# Patient Record
Sex: Female | Born: 1985 | Race: Black or African American | Hispanic: No | Marital: Married | Smoking: Former smoker
Health system: Southern US, Community
[De-identification: ages and names within clinical notes are randomized; demographics above are authoritative.]

## PROBLEM LIST (undated history)

## (undated) DIAGNOSIS — J45909 Unspecified asthma, uncomplicated: Secondary | ICD-10-CM

## (undated) HISTORY — PX: TONSILLECTOMY: SUR1361

---

## 2006-02-01 HISTORY — PX: CERVICAL BIOPSY  W/ LOOP ELECTRODE EXCISION: SUR135

## 2008-04-13 ENCOUNTER — Observation Stay: Payer: Self-pay

## 2008-07-23 ENCOUNTER — Inpatient Hospital Stay: Payer: Self-pay | Admitting: Unknown Physician Specialty

## 2009-05-23 ENCOUNTER — Ambulatory Visit: Payer: Self-pay | Admitting: Family Medicine

## 2013-09-28 DIAGNOSIS — Z9889 Other specified postprocedural states: Secondary | ICD-10-CM | POA: Insufficient documentation

## 2015-03-07 ENCOUNTER — Ambulatory Visit: Payer: Self-pay | Admitting: Internal Medicine

## 2015-03-17 ENCOUNTER — Ambulatory Visit (INDEPENDENT_AMBULATORY_CARE_PROVIDER_SITE_OTHER): Payer: Managed Care, Other (non HMO) | Admitting: Internal Medicine

## 2015-03-17 ENCOUNTER — Encounter: Payer: Self-pay | Admitting: Internal Medicine

## 2015-03-17 VITALS — BP 122/76 | HR 80 | Ht 65.0 in | Wt 230.0 lb

## 2015-03-17 DIAGNOSIS — D509 Iron deficiency anemia, unspecified: Secondary | ICD-10-CM | POA: Insufficient documentation

## 2015-03-17 DIAGNOSIS — R635 Abnormal weight gain: Secondary | ICD-10-CM

## 2015-03-17 DIAGNOSIS — R3915 Urgency of urination: Secondary | ICD-10-CM

## 2015-03-17 DIAGNOSIS — E785 Hyperlipidemia, unspecified: Secondary | ICD-10-CM

## 2015-03-17 DIAGNOSIS — M5431 Sciatica, right side: Secondary | ICD-10-CM | POA: Insufficient documentation

## 2015-03-17 LAB — POCT URINALYSIS DIPSTICK
BILIRUBIN UA: NEGATIVE
Glucose, UA: NEGATIVE
KETONES UA: NEGATIVE
LEUKOCYTES UA: NEGATIVE
Nitrite, UA: NEGATIVE
PH UA: 6
Protein, UA: NEGATIVE
RBC UA: NEGATIVE
Spec Grav, UA: >=1.03
Urobilinogen, UA: 0.2

## 2015-03-17 MED ORDER — IBUPROFEN 800 MG PO TABS: 800.0000 mg | ORAL_TABLET | Freq: Three times a day (TID) | ORAL | Status: DC | PRN

## 2015-03-17 NOTE — Progress Notes (Signed)
Date:  03/17/2015   Name:  Carly Martin   DOB:  Jun 23, 1985   MRN:  [id number]  Patient is re-establishing care.  She left due to [employer] issues.  She also had a baby last year and was under the care of OB-GYN.  Now she has another plan and wants to re-establish.  Chief Complaint: New Evaluation and Low Back Pressure Back Pain This is a new problem. The current episode started more than 1 year ago. The problem occurs every several days. The pain is present in the gluteal. The quality of the pain is described as shooting. The pain radiates to the right knee. The pain is mild. Associated symptoms include dysuria. Pertinent negatives include no abdominal pain, chest pain, fever or headaches. She has tried heat for the symptoms. The treatment provided mild relief.  She also wonders if she might not have a urinary tract infection. Yesterday she had a little bit of discomfort at the end of urination but no persistent discomfort or hematuria.  Weight gain - she has gained 30 lbs in the past year.  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  She has recently started trying to take the stairs and walk during lunch.  Anemia - moderate anemia after her delivery.  She has not had any follow up or taking iron supplement.  Review of Systems  Constitutional: Positive for unexpected weight change (30 lbs in the past year). Negative for fever, chills and fatigue.  Respiratory: Negative for chest tightness and shortness of breath.   Cardiovascular: Negative for chest pain, palpitations and leg swelling.  Gastrointestinal: Negative for abdominal pain, constipation and blood in stool.  Genitourinary: Positive for dysuria. Negative for hematuria and menstrual problem.  Musculoskeletal: Positive for back pain. Negative for myalgias and joint swelling.  Skin: Negative for rash.  Neurological: Negative for dizziness and headaches.    Patient Active Problem List   Diagnosis Date  Noted  . History of surgical procedure 09/28/2013    Prior to Admission medications   Medication Sig Start Date End Date Taking? Authorizing Provider  albuterol (VENTOLIN HFA) 108 (90 Base) MCG/ACT inhaler Inhale 2 puffs into the lungs every 6 (six) hours as needed. 03/06/13  Yes Historical Provider, MD    Allergies  Allergen Reactions  . Codeine Shortness Of Breath    Past Surgical History  Procedure Laterality Date  . No past surgeries      Social History  Substance Use Topics  . Smoking status: Never Smoker   . Smokeless tobacco: None  . Alcohol Use: No    Medication list has been reviewed and updated.   Physical Exam  Constitutional: She is oriented to person, place, and time. She appears well-developed and well-nourished. No distress.  HENT:  Head: Normocephalic and atraumatic.  Neck: Normal range of motion. Neck supple. No thyromegaly present.  Cardiovascular: Normal rate, regular rhythm and normal heart sounds.   Pulmonary/Chest: Effort normal and breath sounds normal. No respiratory distress.  Musculoskeletal: She exhibits no edema.       Lumbar back: She exhibits tenderness. She exhibits no pain and no spasm.  Positive SLR sitting on right Negative SLR sitting on left  Neurological: She is alert and oriented to person, place, and time. She has normal reflexes.  Skin: Skin is warm and dry. No rash noted.  Psychiatric: She has a normal mood and affect. Her behavior is normal. Thought content normal.    BP 122/76  mmHg  Pulse 80  Ht  (1.651 m)  Wt 230 lb (104.327 kg)  BMI 38.27 kg/m2  LMP 03/02/2015  Assessment and Plan: 1. Sciatica of right side Adjust work station - lumbar support, foot rest, frequent breaks Heat or ice as needed - ibuprofen (ADVIL,MOTRIN) 800 MG tablet; Take 1 tablet (800 mg total) by mouth every 8 (eight) hours as needed.  Dispense: 90 tablet; Refill: 3  2. Urinary urgency UA negative - POCT urinalysis dipstick  3.  Hyperlipidemia Advise low fat diet - Lipid panel  4. Iron deficiency anemia May need to begin iron supplement - CBC with Differential/Platelet  5. Weight gain, abnormal - Comprehensive metabolic panel - Hemoglobin A1c - TSH   Bari Edward, MD [facility]  03/17/2015

## 2015-03-19 ENCOUNTER — Telehealth: Payer: Self-pay

## 2015-03-19 LAB — COMPREHENSIVE METABOLIC PANEL
ALBUMIN: 4.2 g/dL (ref 3.5–5.5)
ALK PHOS: 60 IU/L (ref 39–117)
ALT: 14 IU/L (ref 0–32)
AST: 14 IU/L (ref 0–40)
Albumin/Globulin Ratio: 1.4 (ref 1.1–2.5)
BUN/Creatinine Ratio: 17 (ref 8–20)
BUN: 11 mg/dL (ref 6–20)
Bilirubin Total: 0.4 mg/dL (ref 0.0–1.2)
CHLORIDE: 98 mmol/L (ref 96–106)
CO2: 23 mmol/L (ref 18–29)
Calcium: 9.3 mg/dL (ref 8.7–10.2)
Creatinine, Ser: 0.64 mg/dL (ref 0.57–1.00)
GFR calc Af Amer: 138 mL/min/{1.73_m2} (ref >59)
GFR, EST NON AFRICAN AMERICAN: 120 mL/min/{1.73_m2} (ref >59)
GLOBULIN, TOTAL: 3.1 g/dL (ref 1.5–4.5)
Glucose: 88 mg/dL (ref 65–99)
POTASSIUM: 4.6 mmol/L (ref 3.5–5.2)
Sodium: 136 mmol/L (ref 134–144)
TOTAL PROTEIN: 7.3 g/dL (ref 6.0–8.5)

## 2015-03-19 LAB — CBC WITH DIFFERENTIAL/PLATELET
Basophils Absolute: 0.1 10*3/uL (ref 0.0–0.2)
Basos: 1 %
EOS (ABSOLUTE): 0.2 10*3/uL (ref 0.0–0.4)
EOS: 2 %
HEMATOCRIT: 36.9 % (ref 34.0–46.6)
HEMOGLOBIN: 11.3 g/dL (ref 11.1–15.9)
Immature Grans (Abs): 0 10*3/uL (ref 0.0–0.1)
Immature Granulocytes: 0 %
LYMPHS ABS: 2.5 10*3/uL (ref 0.7–3.1)
Lymphs: 32 %
MCH: 26.7 pg (ref 26.6–33.0)
MCHC: 30.6 g/dL — AB (ref 31.5–35.7)
MCV: 87 fL (ref 79–97)
MONOCYTES: 6 %
Monocytes Absolute: 0.5 10*3/uL (ref 0.1–0.9)
NEUTROS ABS: 4.7 10*3/uL (ref 1.4–7.0)
Neutrophils: 59 %
Platelets: 308 10*3/uL (ref 150–379)
RBC: 4.24 x10E6/uL (ref 3.77–5.28)
RDW: 15.3 % (ref 12.3–15.4)
WBC: 7.9 10*3/uL (ref 3.4–10.8)

## 2015-03-19 LAB — LIPID PANEL
Chol/HDL Ratio: 4.6 ratio units — ABNORMAL HIGH (ref 0.0–4.4)
Cholesterol, Total: 165 mg/dL (ref 100–199)
HDL: 36 mg/dL — AB (ref >39)
LDL Calculated: 120 mg/dL — ABNORMAL HIGH (ref 0–99)
TRIGLYCERIDES: 46 mg/dL (ref 0–149)
VLDL CHOLESTEROL CAL: 9 mg/dL (ref 5–40)

## 2015-03-19 LAB — HEMOGLOBIN A1C
Est. average glucose Bld gHb Est-mCnc: 114 mg/dL
Hgb A1c MFr Bld: 5.6 % (ref 4.8–5.6)

## 2015-03-19 LAB — TSH: TSH: 2 u[IU]/mL (ref 0.450–4.500)

## 2015-03-19 NOTE — Telephone Encounter (Signed)
Spoke with patient. Patient advised of all results and verbalized understanding. Will call back with any future questions or concerns. MAH  

## 2015-03-19 NOTE — Telephone Encounter (Signed)
-----   Message from Reubin Milan, MD sent at 03/19/2015  1:57 PM EST ----- Cholesterol i(LDL) is slightly high - would benefit from regular exercise and a low fat diet.  All other labs are normal.

## 2015-04-14 ENCOUNTER — Encounter: Payer: Self-pay | Admitting: Internal Medicine

## 2015-04-14 ENCOUNTER — Other Ambulatory Visit: Payer: Self-pay | Admitting: Internal Medicine

## 2015-04-14 MED ORDER — CIPROFLOXACIN HCL 250 MG PO TABS: 250.0000 mg | ORAL_TABLET | Freq: Two times a day (BID) | ORAL | Status: DC

## 2015-09-03 DIAGNOSIS — G44229 Chronic tension-type headache, not intractable: Secondary | ICD-10-CM | POA: Insufficient documentation

## 2015-09-03 DIAGNOSIS — R202 Paresthesia of skin: Principal | ICD-10-CM

## 2015-09-03 DIAGNOSIS — R200 Anesthesia of skin: Secondary | ICD-10-CM | POA: Insufficient documentation

## 2015-12-12 ENCOUNTER — Ambulatory Visit (INDEPENDENT_AMBULATORY_CARE_PROVIDER_SITE_OTHER): Payer: Managed Care, Other (non HMO) | Admitting: Internal Medicine

## 2015-12-12 ENCOUNTER — Other Ambulatory Visit: Payer: Self-pay | Admitting: Internal Medicine

## 2015-12-12 ENCOUNTER — Encounter: Payer: Self-pay | Admitting: Internal Medicine

## 2015-12-12 VITALS — BP 112/64 | HR 106 | Ht 65.0 in | Wt 228.4 lb

## 2015-12-12 DIAGNOSIS — R8290 Unspecified abnormal findings in urine: Secondary | ICD-10-CM | POA: Diagnosis not present

## 2015-12-12 DIAGNOSIS — N3000 Acute cystitis without hematuria: Secondary | ICD-10-CM

## 2015-12-12 DIAGNOSIS — R202 Paresthesia of skin: Principal | ICD-10-CM

## 2015-12-12 DIAGNOSIS — R200 Anesthesia of skin: Secondary | ICD-10-CM

## 2015-12-12 LAB — POCT URINALYSIS DIPSTICK
BILIRUBIN UA: NEGATIVE
Blood, UA: NEGATIVE
GLUCOSE UA: NEGATIVE
KETONES UA: NEGATIVE
LEUKOCYTES UA: NEGATIVE
Nitrite, UA: NEGATIVE
PROTEIN UA: NEGATIVE
SPEC GRAV UA: 1.025
Urobilinogen, UA: 0.2
pH, UA: 6.5

## 2015-12-12 MED ORDER — CIPROFLOXACIN HCL 250 MG PO TABS: 250.0000 mg | ORAL_TABLET | Freq: Two times a day (BID) | ORAL | 0 refills | Status: AC

## 2015-12-12 NOTE — Progress Notes (Signed)
Date:  12/12/2015   Name:  Carly Martin   DOB:  08-Jan-1986   MRN:  [id number]   Chief Complaint: Urinary Tract Infection (x2 weeks, Azo helped a little, urine has ordor and cloudy, feels like bladder is really full but only a little comes out) Urinary Tract Infection   This is a new problem. The current episode started in the past 7 days. The problem occurs every urination. The problem has been waxing and waning. The quality of the pain is described as aching. There has been no fever. Associated symptoms include frequency and urgency. Pertinent negatives include no chills. The treatment provided mild relief.     Review of Systems  Constitutional: Negative for chills, fatigue and fever.  Respiratory: Negative for chest tightness, shortness of breath and stridor.   Cardiovascular: Negative for chest pain.  Endocrine: Positive for polyuria. Negative for polydipsia.  Genitourinary: Positive for frequency and urgency. Negative for difficulty urinating, dyspareunia and pelvic pain.    Patient Active Problem List   Diagnosis Date Noted  . Numbness and tingling 09/03/2015  . Chronic tension-type headache, not intractable 09/03/2015  . Sciatica of right side 03/17/2015  . Iron deficiency anemia 03/17/2015  . History of surgical procedure 09/28/2013    Prior to Admission medications   Medication Sig Start Date End Date Taking? Authorizing Provider  albuterol (VENTOLIN HFA) 108 (90 Base) MCG/ACT inhaler Inhale 2 puffs into the lungs every 6 (six) hours as needed. 03/06/13  Yes Historical Provider, MD  etonogestrel (IMPLANON) 68 MG IMPL implant 1 each by Subdermal route once.   Yes Historical Provider, MD  gabapentin (NEURONTIN) 100 MG capsule Take 2 capsules by mouth 2 (two) times daily. 10/15/15  Yes Historical Provider, MD  ibuprofen (ADVIL,MOTRIN) 800 MG tablet Take 1 tablet (800 mg total) by mouth every 8 (eight) hours as needed. 03/17/15  Yes Reubin Milan, MD    Allergies    Allergen Reactions  . Codeine Shortness Of Breath    Past Surgical History:  Procedure Laterality Date  . CERVICAL BIOPSY  W/ LOOP ELECTRODE EXCISION  2008    Social History  Substance Use Topics  . Smoking status: Former Games developer  . Smokeless tobacco: Not on file  . Alcohol use 0.0 oz/week     Medication list has been reviewed and updated.   Physical Exam  Constitutional: She is oriented to person, place, and time. She appears well-developed. No distress.  HENT:  Head: Normocephalic and atraumatic.  Neck: Normal range of motion. Neck supple.  Cardiovascular: Normal rate, regular rhythm and normal heart sounds.   Pulmonary/Chest: Effort normal and breath sounds normal. No respiratory distress.  Abdominal: Soft. Bowel sounds are normal. There is tenderness in the suprapubic area. There is no CVA tenderness.  Musculoskeletal: Normal range of motion.  Neurological: She is alert and oriented to person, place, and time.  Skin: Skin is warm and dry. No rash noted.  Psychiatric: She has a normal mood and affect. Her behavior is normal. Thought content normal.  Nursing note and vitals reviewed.   BP 112/64 (BP Location: Left Arm, Patient Position: Sitting, Cuff Size: Large)   Pulse (!) 106   Ht 5\' 5"  (1.651 m)   Wt 228 lb 6.4 oz (103.6 kg)   SpO2 98%   BMI 38.01 kg/m   Assessment and Plan: 1. Abnormal urine odor - POCT urinalysis dipstick  2. Acute cystitis without hematuria Increase fluids - ciprofloxacin (CIPRO) 250 MG tablet; Take 1  tablet (250 mg total) by mouth 2 (two) times daily.  Dispense: 14 tablet; Refill: 0   Bari EdwardLaura Lucie Friedlander, MD [facility]  12/12/2015

## 2015-12-29 ENCOUNTER — Ambulatory Visit (INDEPENDENT_AMBULATORY_CARE_PROVIDER_SITE_OTHER): Payer: Managed Care, Other (non HMO)

## 2015-12-29 ENCOUNTER — Ambulatory Visit
Admission: EM | Admit: 2015-12-29 | Discharge: 2015-12-29 | Disposition: A | Discharge status: Home / Self Care | Payer: Managed Care, Other (non HMO) | Attending: Family Medicine | Admitting: Family Medicine

## 2015-12-29 DIAGNOSIS — W19XXXA Unspecified fall, initial encounter: Secondary | ICD-10-CM

## 2015-12-29 DIAGNOSIS — Y92099 Unspecified place in other non-institutional residence as the place of occurrence of the external cause: Secondary | ICD-10-CM

## 2015-12-29 DIAGNOSIS — S161XXA Strain of muscle, fascia and tendon at neck level, initial encounter: Secondary | ICD-10-CM | POA: Diagnosis not present

## 2015-12-29 DIAGNOSIS — Y92009 Unspecified place in unspecified non-institutional (private) residence as the place of occurrence of the external cause: Principal | ICD-10-CM

## 2015-12-29 DIAGNOSIS — S99912A Unspecified injury of left ankle, initial encounter: Secondary | ICD-10-CM

## 2015-12-29 MED ORDER — MELOXICAM 15 MG PO TABS: 15.0000 mg | ORAL_TABLET | Freq: Every day | ORAL | 1 refills | Status: DC

## 2015-12-29 MED ORDER — ORPHENADRINE CITRATE ER 100 MG PO TB12: 100.0000 mg | ORAL_TABLET | Freq: Two times a day (BID) | ORAL | 0 refills | Status: DC

## 2015-12-29 NOTE — ED Provider Notes (Signed)
[facility]    CSN: [id number] Arrival date & time: 12/29/15  1845     History   Chief Complaint Chief Complaint  Patient presents with  . Fall    HPI Perrin SmackSharika L Williams is a 30 y.o. female.   Patient is a 30 year old black female who fell this evening. She states that she was at home cooking fried pork chops when apparently she forgot that the wash shingle was open and she tripped over the wash machine door landing with her head hitting the refrigerator which broke her fall. She states that side with 3 children laughing at her she was stunned and had difficulty getting up initially. She never lost consciousness. She did express some nausea but no vomiting afterwards saw stars. She started having neck pain headache and left ankle pain as well. And came to the urgent care to be seen and evaluated. No history of concussions before the history is significant ankle or neck injury. Systemic migraines she is a former smoker and she is allergic to codeine. She's had a history of chronic tension type headaches psych on the right side. She has cervical biopsies before with loop cauterization. She is allergic to codeine   The history is provided by the patient and the spouse. No language interpreter was used.  Fall  This is a new problem. The current episode started 3 to 5 hours ago. The problem occurs constantly. The problem has been gradually improving. Associated symptoms include headaches. Pertinent negatives include no chest pain and no abdominal pain. Nothing aggravates the symptoms. She has tried nothing for the symptoms. The treatment provided no relief.    History reviewed. No pertinent past medical history.  Patient Active Problem List   Diagnosis Date Noted  . Numbness and tingling 09/03/2015  . Chronic tension-type headache, not intractable 09/03/2015  . Sciatica of right side 03/17/2015  . Iron deficiency anemia 03/17/2015  . History of surgical procedure 09/28/2013     Past Surgical History:  Procedure Laterality Date  . CERVICAL BIOPSY  W/ LOOP ELECTRODE EXCISION  2008    OB History    No data available       Home Medications    Prior to Admission medications   Medication Sig Start Date End Date Taking? Authorizing Provider  albuterol (VENTOLIN HFA) 108 (90 Base) MCG/ACT inhaler Inhale 2 puffs into the lungs every 6 (six) hours as needed. 03/06/13  Yes Historical Provider, MD  etonogestrel (IMPLANON) 68 MG IMPL implant 1 each by Subdermal route once.   Yes Historical Provider, MD  gabapentin (NEURONTIN) 100 MG capsule Take 2 capsules by mouth 2 (two) times daily. 10/15/15  Yes Historical Provider, MD  ibuprofen (ADVIL,MOTRIN) 800 MG tablet Take 1 tablet (800 mg total) by mouth every 8 (eight) hours as needed. 03/17/15  Yes Reubin MilanLaura H Berglund, MD  meloxicam (MOBIC) 15 MG tablet Take 1 tablet (15 mg total) by mouth daily. 12/29/15   Hassan RowanEugene Kaelum Kissick, MD  orphenadrine (NORFLEX) 100 MG tablet Take 1 tablet (100 mg total) by mouth 2 (two) times daily. 12/29/15   Hassan RowanEugene Chamberlain Steinborn, MD    Family History Family History  Problem Relation Age of Onset  . Migraines Sister     Social History Social History  Substance Use Topics  . Smoking status: Former Games developermoker  . Smokeless tobacco: Never Used  . Alcohol use 0.0 oz/week     Allergies   Codeine   Review of Systems Review of Systems  Cardiovascular: Negative for  chest pain.  Gastrointestinal: Negative for abdominal pain.  Musculoskeletal: Positive for myalgias, neck pain and neck stiffness.  Neurological: Positive for numbness and headaches.  All other systems reviewed and are negative.    Physical Exam Triage Vital Signs ED Triage Vitals  Enc Vitals Group     BP 12/29/15 1934 119/65     Pulse Rate 12/29/15 1934 96     Resp 12/29/15 1934 18     Temp 12/29/15 1934 98 F (36.7 C)     Temp Source 12/29/15 1934 Oral     SpO2 12/29/15 1934 100 %     Weight 12/29/15 1934 228 lb (103.4 kg)      Height 12/29/15 1934 5\' 5"  (1.651 m)     Head Circumference --      Peak Flow --      Pain Score 12/29/15 1936 8     Pain Loc --      Pain Edu? --      Excl. in GC? --    No data found.   Updated Vital Signs BP 119/65 (BP Location: Left Arm)   Pulse 96   Temp 98 F (36.7 C) (Oral)   Resp 18   Ht 5\' 5"  (1.651 m)   Wt 228 lb (103.4 kg)   LMP 11/29/2015 (Within Days) Comment: denies preg  SpO2 100%   BMI 37.94 kg/m   Visual Acuity Right Eye Distance:   Left Eye Distance:   Bilateral Distance:    Right Eye Near:   Left Eye Near:    Bilateral Near:     Physical Exam  Constitutional: She is oriented to person, place, and time. She appears well-developed and well-nourished. No distress.  HENT:  Head: Normocephalic and atraumatic.  Right Ear: External ear normal.  Left Ear: External ear normal.  Nose: Nose normal.  Mouth/Throat: Oropharynx is clear and moist.  Eyes: Pupils are equal, round, and reactive to light.  Neck: Normal range of motion. Neck supple.  Pulmonary/Chest: Effort normal.  Musculoskeletal: She exhibits tenderness. She exhibits no deformity.       Left ankle: She exhibits swelling. She exhibits no deformity and normal pulse. Tenderness. Achilles tendon exhibits no pain.       Cervical back: She exhibits tenderness, pain and spasm.       Back:       Feet:  Lymphadenopathy:    She has no cervical adenopathy.  Neurological: She is alert and oriented to person, place, and time. She has normal strength and normal reflexes. She displays normal reflexes. No cranial nerve deficit or sensory deficit. Coordination normal.  Skin: Skin is warm. She is not diaphoretic. No erythema.  Psychiatric: She has a normal mood and affect.  Vitals reviewed.    UC Treatments / Results  Labs (all labs ordered are listed, but only abnormal results are displayed) Labs Reviewed - No data to display  EKG  EKG Interpretation None       Radiology Dg Cervical Spine  Complete  Result Date: 12/29/2015 CLINICAL DATA:  Tripped and fell backwards in kitchen tonight injuring neck. EXAM: CERVICAL SPINE - COMPLETE 4+ VIEW COMPARISON:  None. FINDINGS: Mild reversal of the normal cervical lordosis. Vertebral body heights and disc spaces are within normal. Prevertebral soft tissues are normal. No significant neural foraminal narrowing. Atlantoaxial articulation is within normal. No acute fracture or subluxation. IMPRESSION: Negative cervical spine radiographs. Electronically Signed   By: Elberta Fortis M.D.   On: 12/29/2015 21:23  Dg Ankle Complete Left  Result Date: 12/29/2015 CLINICAL DATA:  Tripped and fell backwards in kitchen injuring left ankle and neck. Pain and swelling lateral left ankle. EXAM: LEFT ANKLE COMPLETE - 3+ VIEW COMPARISON:  None. FINDINGS: There is no evidence of fracture, dislocation, or joint effusion. There is no evidence of arthropathy or other focal bone abnormality. Soft tissues are unremarkable. IMPRESSION: Negative. Electronically Signed   By: Elberta Fortis M.D.   On: 12/29/2015 21:22    Procedures Procedures (including critical care time)  Medications Ordered in UC Medications - No data to display   Initial Impression / Assessment and Plan / UC Course  I have reviewed the triage vital signs and the nursing notes.  Pertinent labs & imaging results that were available during my care of the patient were reviewed by me and considered in my medical decision making (see chart for details).  Clinical Course     Patient is here because of head injury explained to think she has a mild concussion because was no loss consciousness no further treatment will be offered a needed that did tell that she start skating dizzy amnestic problems or headache is worse she'll need to go to the ED of her choice to have probable CT scan done of her head. For the neck she has muscle spasms cervical spine is negative as expected we'll place her on Mobic  Norflex for pain and discomfort recommend follow-up with chiropractor choice for the left ankle contusion will offer her an ankle sleeve ankle x-ray is negative mobile chest help the ankle pain and will give her a note for work for today and tomorrow so she has an opportunity to elevate the leg and put ice on it and rested  Final Clinical Impressions(s) / UC Diagnoses   Final diagnoses:  Fall in home, initial encounter  Cervical strain, acute, initial encounter  Left ankle injury, initial encounter    New Prescriptions New Prescriptions   MELOXICAM (MOBIC) 15 MG TABLET    Take 1 tablet (15 mg total) by mouth daily.   ORPHENADRINE (NORFLEX) 100 MG TABLET    Take 1 tablet (100 mg total) by mouth 2 (two) times daily.      Note: This dictation was prepared with Dragon dictation along with smaller phrase technology. Any transcriptional errors that result from this process are unintentional.   Hassan Rowan, MD 12/29/15 2134

## 2015-12-29 NOTE — ED Triage Notes (Signed)
Pt tripped over dishwasher door and fell backwards and hit her head on refrigerator, and her neck , left shoulder and left ankle pain.

## 2016-02-28 ENCOUNTER — Encounter: Payer: Self-pay | Admitting: Internal Medicine

## 2016-03-03 ENCOUNTER — Encounter: Payer: Self-pay | Admitting: Internal Medicine

## 2016-03-03 ENCOUNTER — Ambulatory Visit (INDEPENDENT_AMBULATORY_CARE_PROVIDER_SITE_OTHER): Payer: 59 | Admitting: Internal Medicine

## 2016-03-03 VITALS — BP 120/82 | HR 90 | Temp 97.8°F | Wt 226.0 lb

## 2016-03-03 DIAGNOSIS — R296 Repeated falls: Secondary | ICD-10-CM | POA: Diagnosis not present

## 2016-03-03 DIAGNOSIS — G44229 Chronic tension-type headache, not intractable: Secondary | ICD-10-CM | POA: Diagnosis not present

## 2016-03-03 DIAGNOSIS — D509 Iron deficiency anemia, unspecified: Secondary | ICD-10-CM | POA: Diagnosis not present

## 2016-03-03 DIAGNOSIS — R42 Dizziness and giddiness: Secondary | ICD-10-CM | POA: Insufficient documentation

## 2016-03-03 NOTE — Progress Notes (Signed)
Date:  03/03/2016   Name:  Carly Martin   DOB:  [birth year]   MRN:  [id number]   Chief Complaint: Headache and Dizziness (pt stated fell 3x in the past 3 months) Headache   This is a chronic problem. The current episode started more than 1 year ago. The problem occurs daily. The problem has been unchanged. Associated symptoms include dizziness, ear pain, nausea and tinnitus. Pertinent negatives include no coughing, fever or weakness.  Dizziness  This is a new problem. The current episode started more than 1 month ago. The problem occurs intermittently. Associated symptoms include fatigue, headaches and nausea. Pertinent negatives include no chest pain, chills, coughing, fever or weakness. Nothing aggravates the symptoms. The treatment provided mild relief.  She has fallen 5 times in the past 4 months - always when standing and with lightheadedness.  Sometimes gets a sense of decreased depth perception.  No significant injury and no head injury with falls. She started gabapentin 4-5 months ago for arm sx but these have not improved.  Also started nortriptyline for headaches but this has not helped either.  Review of Systems  Constitutional: Positive for fatigue. Negative for chills and fever.  HENT: Positive for ear pain and tinnitus. Negative for ear discharge.   Respiratory: Negative for cough, chest tightness and shortness of breath.   Cardiovascular: Negative for chest pain and palpitations.  Gastrointestinal: Positive for nausea.  Neurological: Positive for dizziness and headaches. Negative for tremors and weakness.    Patient Active Problem List   Diagnosis Date Noted  . Numbness and tingling 09/03/2015  . Chronic tension-type headache, not intractable 09/03/2015  . Sciatica of right side 03/17/2015  . Iron deficiency anemia 03/17/2015  . History of loop electrical excision procedure (LEEP) 09/28/2013    Prior to Admission medications   Medication Sig Start Date End Date  Taking? Authorizing Provider  albuterol (VENTOLIN HFA) 108 (90 Base) MCG/ACT inhaler Inhale 2 puffs into the lungs every 6 (six) hours as needed. 03/06/13  Yes Historical Provider, MD  etonogestrel (IMPLANON) 68 MG IMPL implant 1 each by Subdermal route once.   Yes Historical Provider, MD  gabapentin (NEURONTIN) 100 MG capsule Take 2 capsules by mouth 2 (two) times daily. 10/15/15  Yes Historical Provider, MD  ibuprofen (ADVIL,MOTRIN) 800 MG tablet Take 1 tablet (800 mg total) by mouth every 8 (eight) hours as needed. 03/17/15  Yes Reubin Milan, MD  orphenadrine (NORFLEX) 100 MG tablet  12/29/15  Yes Historical Provider, MD    Allergies  Allergen Reactions  . Codeine Shortness Of Breath    Past Surgical History:  Procedure Laterality Date  . CERVICAL BIOPSY  W/ LOOP ELECTRODE EXCISION  2008    Social History  Substance Use Topics  . Smoking status: Former Games developer  . Smokeless tobacco: Never Used  . Alcohol use 0.0 oz/week     Medication list has been reviewed and updated.   Physical Exam  Constitutional: She is oriented to person, place, and time. She appears well-developed. No distress.  HENT:  Head: Normocephalic and atraumatic.  Eyes: Conjunctivae and EOM are normal. Pupils are equal, round, and reactive to light.  Cardiovascular: Normal rate, regular rhythm and normal heart sounds.   Pulmonary/Chest: Effort normal and breath sounds normal. No respiratory distress. She has no wheezes.  Musculoskeletal: Normal range of motion.  Lymphadenopathy:    She has no cervical adenopathy.  Neurological: She is alert and oriented to person, place, and time. She has  normal strength and normal reflexes. No cranial nerve deficit or sensory deficit. Coordination and gait normal.  Skin: Skin is warm and dry. No rash noted.  Psychiatric: She has a normal mood and affect. Her behavior is normal. Thought content normal.  Nursing note and vitals reviewed.   BP 120/82   Pulse 90   Temp  97.8 F (36.6 C)   Wt 226 lb (102.5 kg)   SpO2 98%   BMI 37.61 kg/m   Assessment and Plan: 1. Lightheadedness No evidence of orthostatic changes Recommend taper and D/C gabapentin then nortriptyline - Comprehensive metabolic panel - TSH  2. Chronic tension-type headache, not intractable Follow up with Dr. Malvin Johns  3. Iron deficiency anemia, unspecified iron deficiency anemia type - CBC with Differential/Platelet  4. Frequent falls Due to lightheadedness which may be from medication side effects   Bari Edward, MD [facility]  03/03/2016

## 2016-03-04 LAB — CBC WITH DIFFERENTIAL/PLATELET
BASOS ABS: 0.1 10*3/uL (ref 0.0–0.2)
Basos: 1 %
EOS (ABSOLUTE): 0.2 10*3/uL (ref 0.0–0.4)
Eos: 2 %
Hematocrit: 35.7 % (ref 34.0–46.6)
Hemoglobin: 11.3 g/dL (ref 11.1–15.9)
IMMATURE GRANS (ABS): 0 10*3/uL (ref 0.0–0.1)
IMMATURE GRANULOCYTES: 0 %
LYMPHS: 32 %
Lymphocytes Absolute: 3.5 10*3/uL — ABNORMAL HIGH (ref 0.7–3.1)
MCH: 27.2 pg (ref 26.6–33.0)
MCHC: 31.7 g/dL (ref 31.5–35.7)
MCV: 86 fL (ref 79–97)
MONOS ABS: 0.9 10*3/uL (ref 0.1–0.9)
Monocytes: 8 %
NEUTROS PCT: 57 %
Neutrophils Absolute: 6.4 10*3/uL (ref 1.4–7.0)
PLATELETS: 328 10*3/uL (ref 150–379)
RBC: 4.15 x10E6/uL (ref 3.77–5.28)
RDW: 14.4 % (ref 12.3–15.4)
WBC: 11 10*3/uL — AB (ref 3.4–10.8)

## 2016-03-04 LAB — COMPREHENSIVE METABOLIC PANEL
ALT: 11 IU/L (ref 0–32)
AST: 9 IU/L (ref 0–40)
Albumin/Globulin Ratio: 1.4 (ref 1.2–2.2)
Albumin: 4.2 g/dL (ref 3.5–5.5)
Alkaline Phosphatase: 56 IU/L (ref 39–117)
BILIRUBIN TOTAL: 0.3 mg/dL (ref 0.0–1.2)
BUN/Creatinine Ratio: 18 (ref 9–23)
BUN: 13 mg/dL (ref 6–20)
CHLORIDE: 101 mmol/L (ref 96–106)
CO2: 24 mmol/L (ref 18–29)
Calcium: 9.4 mg/dL (ref 8.7–10.2)
Creatinine, Ser: 0.71 mg/dL (ref 0.57–1.00)
GFR calc Af Amer: 131 mL/min/{1.73_m2} (ref >59)
GFR, EST NON AFRICAN AMERICAN: 114 mL/min/{1.73_m2} (ref >59)
GLUCOSE: 88 mg/dL (ref 65–99)
Globulin, Total: 3 g/dL (ref 1.5–4.5)
POTASSIUM: 4.4 mmol/L (ref 3.5–5.2)
Sodium: 137 mmol/L (ref 134–144)
TOTAL PROTEIN: 7.2 g/dL (ref 6.0–8.5)

## 2016-03-04 LAB — TSH: TSH: 1.32 u[IU]/mL (ref 0.450–4.500)

## 2016-04-28 ENCOUNTER — Encounter: Payer: Self-pay | Admitting: Internal Medicine

## 2016-09-22 ENCOUNTER — Encounter: Payer: Self-pay | Admitting: Internal Medicine

## 2016-09-28 ENCOUNTER — Ambulatory Visit (INDEPENDENT_AMBULATORY_CARE_PROVIDER_SITE_OTHER): Payer: 59 | Admitting: Internal Medicine

## 2016-09-28 ENCOUNTER — Encounter: Payer: Self-pay | Admitting: Internal Medicine

## 2016-09-28 VITALS — BP 108/64 | HR 86 | Ht 65.0 in | Wt 224.2 lb

## 2016-09-28 DIAGNOSIS — K581 Irritable bowel syndrome with constipation: Secondary | ICD-10-CM | POA: Diagnosis not present

## 2016-09-28 DIAGNOSIS — F331 Major depressive disorder, recurrent, moderate: Secondary | ICD-10-CM | POA: Insufficient documentation

## 2016-09-28 DIAGNOSIS — R1011 Right upper quadrant pain: Secondary | ICD-10-CM | POA: Insufficient documentation

## 2016-09-28 NOTE — Progress Notes (Signed)
Date:  09/28/2016   Name:  Carly Martin   DOB:  10/07/1985   MRN:  [id number]   Chief Complaint: Abdominal Pain (Patient thinks she may be having gall bladder issues. Agrees to ultrasound if needed. )  Abdominal Pain  This is a new problem. The current episode started more than 1 month ago. The problem occurs every several days. The problem has been gradually worsening. The pain is located in the epigastric region. The pain is mild. The quality of the pain is a sensation of fullness and burning. The abdominal pain radiates to the right shoulder. Associated symptoms include constipation, a fever and nausea. Pertinent negatives include no arthralgias, frequency, hematuria, melena, vomiting or weight loss. The pain is aggravated by eating. She has tried H2 blockers for the symptoms.  Depression - patient reports a long history of depression for which she took Effexor in the past. However she stopped it when [place] and never resumed treatment. She has a son who suffered from depression and actually goes to a therapist. She has not sought care for herself however.  Patient reports a long history of chronic constipation with bowel movements about once per week. She had a colonoscopy continued years ago which was normal. GI told her she likely had IBS with constipation. She tries fiber supplements but no prescription medications. Her general pattern is relatively severe epigastric discomfort followed by need to empty her bowels. At that point she has complete resolution of her pain. The discomfort she describes now is different from her usual discomfort.  Review of Systems  Constitutional: Positive for fever. Negative for chills, diaphoresis, fatigue, unexpected weight change and weight loss.  Respiratory: Negative for chest tightness and shortness of breath.   Cardiovascular: Negative for chest pain, palpitations and leg swelling.  Gastrointestinal: Positive for  abdominal pain, constipation and nausea. Negative for blood in stool, melena and vomiting.  Genitourinary: Negative for frequency, hematuria and urgency.  Musculoskeletal: Negative for arthralgias.  Psychiatric/Behavioral: Positive for dysphoric mood. Negative for sleep disturbance and suicidal ideas.    Patient Active Problem List   Diagnosis Date Noted  . Lightheadedness 03/03/2016  . Numbness and tingling 09/03/2015  . Chronic tension-type headache, not intractable 09/03/2015  . Sciatica of right side 03/17/2015  . Iron deficiency anemia 03/17/2015  . History of loop electrical excision procedure (LEEP) 09/28/2013    Prior to Admission medications   Medication Sig Start Date End Date Taking? Authorizing Provider  albuterol (VENTOLIN HFA) 108 (90 Base) MCG/ACT inhaler Inhale 2 puffs into the lungs every 6 (six) hours as needed. 03/06/13  Yes [provider]  etonogestrel (IMPLANON) 68 MG IMPL implant 1 each by Subdermal route once.   Yes [provider]  ibuprofen (ADVIL,MOTRIN) 800 MG tablet Take 1 tablet (800 mg total) by mouth every 8 (eight) hours as needed. 03/17/15  Yes Reubin Milan, MD    Allergies  Allergen Reactions  . Codeine Shortness Of Breath    Past Surgical History:  Procedure Laterality Date  . CERVICAL BIOPSY  W/ LOOP ELECTRODE EXCISION  2008    Social History  Substance Use Topics  . Smoking status: Former Games developer  . Smokeless tobacco: Never Used  . Alcohol use 0.0 oz/week     Medication list has been reviewed and updated.  PHQ 2/9 Scores 09/28/2016  PHQ - 2 Score 6  PHQ- 9 Score 16    Physical Exam  Constitutional: She is  oriented to person, place, and time. She appears well-developed. No distress.  HENT:  Head: Normocephalic and atraumatic.  Neck: Normal range of motion.  Cardiovascular: Normal rate, regular rhythm and normal heart sounds.   Pulmonary/Chest: Effort normal and breath sounds normal. No respiratory distress.  She has no wheezes.  Abdominal: Soft. Bowel sounds are decreased. There is tenderness in the right upper quadrant and epigastric area. There is no rigidity, no rebound and no guarding.  Musculoskeletal: Normal range of motion.  Neurological: She is alert and oriented to person, place, and time.  Skin: Skin is warm and dry. No rash noted.  Psychiatric: She has a normal mood and affect. Her behavior is normal. Thought content normal.  Nursing note and vitals reviewed.   BP 108/64   Pulse 86   Ht 5\' 5"  (1.651 m)   Wt 224 lb 3.2 oz (101.7 kg)   LMP 08/27/2016 (Exact Date)   SpO2 99%   BMI 37.31 kg/m   Assessment and Plan: 1. Right upper quadrant abdominal pain - H. pylori antibody, IgG - US Abdomen Limited RUQ; Future  2. Irritable bowel syndrome with constipation May need GI to re-evaluate if [place] is normal  3. Moderate episode of recurrent major depressive disorder [facility]) Discussed seeking care through specialist - several names were given - CBC and [employer].   No orders of the defined types were placed in this encounter.   Partially dictated using Animal nutritionist. Any errors are unintentional.  Bari Edward, MD [facility]  09/28/2016

## 2016-09-28 NOTE — Patient Instructions (Signed)
Continue Ranitidine 150 mg once a day

## 2016-09-29 LAB — H. PYLORI ANTIBODY, IGG

## 2016-10-01 ENCOUNTER — Ambulatory Visit
Admission: RE | Admit: 2016-10-01 | Discharge: 2016-10-01 | Disposition: A | Discharge status: Home / Self Care | Payer: POS | Source: Ambulatory Visit | Attending: Internal Medicine | Admitting: Internal Medicine

## 2016-10-01 ENCOUNTER — Other Ambulatory Visit: Payer: Self-pay | Admitting: Internal Medicine

## 2016-10-01 DIAGNOSIS — R1011 Right upper quadrant pain: Secondary | ICD-10-CM | POA: Diagnosis present

## 2016-10-01 NOTE — Progress Notes (Signed)
Spoke to pt about [place]- she stated she seen on MyChart. - agree's to see GI. Informed they will contact her about scheduling.

## 2016-10-26 ENCOUNTER — Ambulatory Visit
Admission: EM | Admit: 2016-10-26 | Discharge: 2016-10-26 | Disposition: A | Discharge status: Home / Self Care | Payer: POS | Attending: Emergency Medicine | Admitting: Emergency Medicine

## 2016-10-26 ENCOUNTER — Encounter: Payer: Self-pay | Admitting: Emergency Medicine

## 2016-10-26 DIAGNOSIS — R05 Cough: Secondary | ICD-10-CM

## 2016-10-26 DIAGNOSIS — J069 Acute upper respiratory infection, unspecified: Secondary | ICD-10-CM | POA: Diagnosis not present

## 2016-10-26 DIAGNOSIS — J4521 Mild intermittent asthma with (acute) exacerbation: Secondary | ICD-10-CM

## 2016-10-26 DIAGNOSIS — R058 Other specified cough: Secondary | ICD-10-CM

## 2016-10-26 HISTORY — DX: Unspecified asthma, uncomplicated: J45.909

## 2016-10-26 MED ORDER — HYDROCOD POLST-CPM POLST ER 10-8 MG/5ML PO SUER: 5.0000 mL | Freq: Two times a day (BID) | ORAL | 0 refills | Status: DC | PRN

## 2016-10-26 MED ORDER — BENZONATATE 200 MG PO CAPS: 200.0000 mg | ORAL_CAPSULE | Freq: Three times a day (TID) | ORAL | 0 refills | Status: DC | PRN

## 2016-10-26 MED ORDER — FLUTICASONE PROPIONATE 50 MCG/ACT NA SUSP: 2.0000 | Freq: Every day | NASAL | 0 refills | Status: DC

## 2016-10-26 MED ORDER — PREDNISONE 20 MG PO TABS: 40.0000 mg | ORAL_TABLET | Freq: Every day | ORAL | 0 refills | Status: AC

## 2016-10-26 MED ORDER — ALBUTEROL SULFATE HFA 108 (90 BASE) MCG/ACT IN AERS: 1.0000 | INHALATION_SPRAY | Freq: Four times a day (QID) | RESPIRATORY_TRACT | 0 refills | Status: DC | PRN

## 2016-10-26 NOTE — ED Triage Notes (Signed)
Patient in today c/o cold x 2 weeks, now with productive cough and wheezing. Patient denies fever. Patient does have a history of asthma, but does not have any inhalers.

## 2016-10-26 NOTE — Discharge Instructions (Signed)
1-2 puffs from your albuterol inhaler every 4-6 hours as needed for coughing, wheezing. Use a spacer. Also Flonase, saline nasal irrigation with a Lloyd Huger med sinus rinse or neti pot, Tussionex for the cough during the night, Tessalon for the cough during the day Mucinex D, 5 days of 40 mg of prednisone. follow-up with Dr. Judithann Graves if you do not get better with this. The cough may last for 2-3 weeks after the upper respiratory infection has completely resolved.

## 2016-10-26 NOTE — ED Provider Notes (Signed)
HPI  SUBJECTIVE:  Carly Martin is a 31 y.o. female who presents with greenish nasal congestion, runny nose, postnasal drip for the past 2 weeks. She reports coughing and wheezing which is worse at night. States that she is unable to sleep at night secondary to cough. She reports intermittent sinus pain or pressure but no dental pain, facial swelling, fevers. No ear pain. No chest pain, shortness of breath. No double sickening. the patient states that she never got better. No allergy type symptoms. No antibiotics in the past month. No antipyretic in the past 6-8 hours. She is not using any nasal spray such as Flonase. She does not have an inhaler at home. Her daughter has URI  symptoms as well. Patient has tried Mucinex with some improvement or symptoms, no aggravating factors. She has past medical history of asthma no intubations or admissions, recent steroids. No history diabetes, hypertension. LMP: This week. Denies possibility of being pregnant. [id number], Nyoka Cowden, MD   Past Medical History:  Diagnosis Date  . Asthma     Past Surgical History:  Procedure Laterality Date  . CERVICAL BIOPSY  W/ LOOP ELECTRODE EXCISION  2008  . TONSILLECTOMY      Family History  Problem Relation Age of Onset  . Migraines Sister     Social History  Substance Use Topics  . Smoking status: Former Games developer  . Smokeless tobacco: Never Used  . Alcohol use 0.0 oz/week     Comment: socially    No current facility-administered medications for this encounter.   Current Outpatient Prescriptions:  .  etonogestrel (IMPLANON) 68 MG IMPL implant, 1 each by Subdermal route once., Disp: , Rfl:  .  albuterol (PROVENTIL HFA;VENTOLIN HFA) 108 (90 Base) MCG/ACT inhaler, Inhale 1-2 puffs into the lungs every 6 (six) hours as needed for wheezing or shortness of breath., Disp: 1 Inhaler, Rfl: 0 .  benzonatate (TESSALON) 200 MG capsule, Take 1 capsule (200 mg total) by mouth 3 (three) times daily as needed for  cough., Disp: 30 capsule, Rfl: 0 .  chlorpheniramine-HYDROcodone (TUSSIONEX PENNKINETIC ER) 10-8 MG/5ML SUER, Take 5 mLs by mouth every 12 (twelve) hours as needed for cough., Disp: 120 mL, Rfl: 0 .  fluticasone (FLONASE) 50 MCG/ACT nasal spray, Place 2 sprays into both nostrils daily., Disp: 16 g, Rfl: 0 .  predniSONE (DELTASONE) 20 MG tablet, Take 2 tablets (40 mg total) by mouth daily with breakfast., Disp: 10 tablet, Rfl: 0  Allergies  Allergen Reactions  . Codeine Shortness Of Breath     ROS  As noted in HPI.   Physical Exam  BP 123/75 (BP Location: Left Arm)   Pulse 83   Temp 98.1 F (36.7 C) (Oral)   Resp 16   Ht 5' 5.5" (1.664 m)   Wt 230 lb (104.3 kg)   LMP 10/22/2016 (Exact Date)   SpO2 100%   BMI 37.69 kg/m   Constitutional: Well developed, well nourished, no acute distress. Coughing  Eyes:  EOMI, conjunctiva normal bilaterally HENT: Normocephalic, atraumatic,mucus membranes moist. clear rhinorrhea with erythematous, swollen turbinates. No sinus tenderness. Normal oropharynx with positive postnasal drip.  Lymph: No cervical lymphadenopathy Respiratory: Normal inspiratory effort, fair air movement, lungs clear bilaterally. No chest wall tenderness  Cardiovascular: Normal rate regular rhythm no murmurs rubs or gallops  GI: nondistended skin: No rash, skin intact Musculoskeletal: no deformities Neurologic: Alert & oriented x 3, no focal neuro deficits Psychiatric: Speech and behavior appropriate   ED Course   Medications -  No data to display  No orders of the defined types were placed in this encounter.   No results found for this or any previous visit (from the past 24 hour(s)). No results found.  ED Clinical Impression  Upper respiratory tract infection, unspecified type  Post-viral cough syndrome  Mild intermittent asthma with exacerbation   ED Assessment/Plan  [place] Narcotic database reviewed for this patient, and feel that the risk/benefit  ratio today is favorable for proceeding with a prescription for controlled substance. Last narcotic prescription was in 2017.  Presentation most consistent with URI with post viral cough syndrome however feel that she has a mild asthma flare. We'll send home with albuterol inhaler. She states that she has plenty of spacers at home. Also Flonase, saline nasal irrigation, Tussionex, Tessalon, Mucinex D, 5 days of 40 mg of prednisone. She will follow-up with her primary care physician if she does not get better with this and can consider antibiotics at that time. She has no sinus tenderness or focal lung findings so there are no clear indications for antibiotics today. She agrees with this plan.   Discussed  MDM, plan and followup with patient. Patient  agrees with plan.   Meds ordered this encounter  Medications  . fluticasone (FLONASE) 50 MCG/ACT nasal spray    Sig: Place 2 sprays into both nostrils daily.    Dispense:  16 g    Refill:  0  . predniSONE (DELTASONE) 20 MG tablet    Sig: Take 2 tablets (40 mg total) by mouth daily with breakfast.    Dispense:  10 tablet    Refill:  0  . benzonatate (TESSALON) 200 MG capsule    Sig: Take 1 capsule (200 mg total) by mouth 3 (three) times daily as needed for cough.    Dispense:  30 capsule    Refill:  0  . albuterol (PROVENTIL HFA;VENTOLIN HFA) 108 (90 Base) MCG/ACT inhaler    Sig: Inhale 1-2 puffs into the lungs every 6 (six) hours as needed for wheezing or shortness of breath.    Dispense:  1 Inhaler    Refill:  0  . chlorpheniramine-HYDROcodone (TUSSIONEX PENNKINETIC ER) 10-8 MG/5ML SUER    Sig: Take 5 mLs by mouth every 12 (twelve) hours as needed for cough.    Dispense:  120 mL    Refill:  0    *This clinic note was created using Scientist, clinical (histocompatibility and immunogenetics). Therefore, there may be occasional mistakes despite careful proofreading.  ?   Domenick Gong, MD 10/26/16 2142

## 2016-11-04 ENCOUNTER — Encounter: Payer: Self-pay | Admitting: Internal Medicine

## 2016-11-05 ENCOUNTER — Encounter: Payer: Self-pay | Admitting: Internal Medicine

## 2016-11-05 ENCOUNTER — Ambulatory Visit (INDEPENDENT_AMBULATORY_CARE_PROVIDER_SITE_OTHER): Payer: PRIVATE HEALTH INSURANCE | Admitting: Internal Medicine

## 2016-11-05 VITALS — BP 122/62 | HR 96 | Temp 98.2°F | Ht 65.0 in | Wt 227.0 lb

## 2016-11-05 DIAGNOSIS — N760 Acute vaginitis: Secondary | ICD-10-CM

## 2016-11-05 DIAGNOSIS — J019 Acute sinusitis, unspecified: Secondary | ICD-10-CM

## 2016-11-05 MED ORDER — AMOXICILLIN-POT CLAVULANATE 875-125 MG PO TABS: 1.0000 | ORAL_TABLET | Freq: Two times a day (BID) | ORAL | 0 refills | Status: AC

## 2016-11-05 MED ORDER — FLUCONAZOLE 100 MG PO TABS: 100.0000 mg | ORAL_TABLET | Freq: Every day | ORAL | 0 refills | Status: DC

## 2016-11-05 NOTE — Progress Notes (Signed)
Date:  11/05/2016   Name:  Carly Martin   DOB:  May 12, 1985   MRN:  [id number]   Chief Complaint: Sinusitis (3 weeks. Congestion, cough with yellow/green production. No fever. Went to UC and was given cough syrup, tessalon pearls, allergy meds. Has not had antibiotics. ) and Vaginitis (4 days- due to prednisone. Tried Monistat for 1 day and did not work. Some itching and yeast discharge. Warm, and swollen labia. )  Sinusitis  This is a new problem. The current episode started 1 to 4 weeks ago. The problem has been gradually improving since onset. There has been no fever. Associated symptoms include congestion, coughing and sinus pressure. Pertinent negatives include no chills or shortness of breath. The treatment provided mild relief.  Vaginal Itching  The patient's primary symptoms include genital itching and vaginal discharge. This is a new problem. The current episode started in the past 7 days. The patient is experiencing no pain. Pertinent negatives include no abdominal pain, chills or fever. The vaginal discharge was thick, white and copious. She has tried antifungals for the symptoms. The treatment provided no relief. She is sexually active.     Review of Systems  Constitutional: Negative for chills, fatigue and fever.  HENT: Positive for congestion, postnasal drip, sinus pain and sinus pressure.   Respiratory: Positive for cough. Negative for chest tightness, shortness of breath and wheezing.   Cardiovascular: Negative for chest pain.  Gastrointestinal: Negative for abdominal pain.  Genitourinary: Positive for vaginal discharge.  Allergic/Immunologic: Negative for environmental allergies.    Patient Active Problem List   Diagnosis Date Noted  . Right upper quadrant abdominal pain 09/28/2016  . Irritable bowel syndrome with constipation 09/28/2016  . Moderate episode of recurrent major depressive disorder (HCC) 09/28/2016  . Lightheadedness 03/03/2016  . Numbness and  tingling 09/03/2015  . Chronic tension-type headache, not intractable 09/03/2015  . Sciatica of right side 03/17/2015  . Iron deficiency anemia 03/17/2015  . History of loop electrical excision procedure (LEEP) 09/28/2013    Prior to Admission medications   Medication Sig Start Date End Date Taking? Authorizing Provider  albuterol (PROVENTIL HFA;VENTOLIN HFA) 108 (90 Base) MCG/ACT inhaler Inhale 1-2 puffs into the lungs every 6 (six) hours as needed for wheezing or shortness of breath. 10/26/16  Yes Domenick Gong, MD  chlorpheniramine-HYDROcodone (TUSSIONEX) 10-8 MG/5ML SUER Take 5 mLs by mouth Nightly.  10/26/16  Yes [provider]  etonogestrel (IMPLANON) 68 MG IMPL implant 1 each by Subdermal route once.   Yes [provider]  fluticasone (FLONASE) 50 MCG/ACT nasal spray Place 2 sprays into both nostrils daily. 10/26/16  Yes Domenick Gong, MD    Allergies  Allergen Reactions  . Codeine Shortness Of Breath    Past Surgical History:  Procedure Laterality Date  . CERVICAL BIOPSY  W/ LOOP ELECTRODE EXCISION  2008  . TONSILLECTOMY      Social History  Substance Use Topics  . Smoking status: Former Games developer  . Smokeless tobacco: Never Used  . Alcohol use 0.0 oz/week     Comment: socially     Medication list has been reviewed and updated.  PHQ 2/9 Scores 09/28/2016  PHQ - 2 Score 6  PHQ- 9 Score 16    Physical Exam  Constitutional: She is oriented to person, place, and time. She appears well-developed. No distress.  HENT:  Head: Normocephalic and atraumatic.  Right Ear: Tympanic membrane and ear canal normal.  Left Ear: Tympanic membrane and ear canal normal.  Nose: Right sinus exhibits maxillary sinus tenderness. Left sinus exhibits maxillary sinus tenderness.  Mouth/Throat: No posterior oropharyngeal erythema.  Neck: Normal range of motion. Neck supple.  Cardiovascular: Normal rate, regular rhythm and normal heart sounds.   Pulmonary/Chest:  Effort normal and breath sounds normal. No respiratory distress. She has no wheezes. She has no rhonchi.  Musculoskeletal: Normal range of motion.  Neurological: She is alert and oriented to person, place, and time.  Skin: Skin is warm and dry. No rash noted.  Psychiatric: She has a normal mood and affect. Her speech is normal and behavior is normal. Thought content normal.  Nursing note and vitals reviewed.   BP 122/62   Pulse 96   Temp 98.2 F (36.8 C) (Oral)   Ht  (1.651 m)   Wt 227 lb (103 kg)   LMP 10/22/2016 (Exact Date)   SpO2 97%   BMI 37.77 kg/m   Assessment and Plan: 1. Acute non-recurrent sinusitis, unspecified location Continue flonase and inhalers  - amoxicillin-clavulanate (AUGMENTIN) 875-125 MG tablet; Take 1 tablet by mouth 2 (two) times daily.  Dispense: 20 tablet; Refill: 0  2. Acute vaginitis - fluconazole (DIFLUCAN) 100 MG tablet; Take 1 tablet (100 mg total) by mouth daily.  Dispense: 3 tablet; Refill: 0   Meds ordered this encounter  Medications  . amoxicillin-clavulanate (AUGMENTIN) 875-125 MG tablet    Sig: Take 1 tablet by mouth 2 (two) times daily.    Dispense:  20 tablet    Refill:  0  . fluconazole (DIFLUCAN) 100 MG tablet    Sig: Take 1 tablet (100 mg total) by mouth daily.    Dispense:  3 tablet    Refill:  0    Partially dictated using Animal nutritionist. Any errors are unintentional.  Bari Edward, MD [facility]  11/05/2016

## 2016-11-08 ENCOUNTER — Ambulatory Visit: Payer: Self-pay | Admitting: Internal Medicine

## 2016-11-11 ENCOUNTER — Ambulatory Visit (INDEPENDENT_AMBULATORY_CARE_PROVIDER_SITE_OTHER): Payer: POS | Admitting: Gastroenterology

## 2016-11-11 ENCOUNTER — Encounter: Payer: Self-pay | Admitting: Gastroenterology

## 2016-11-11 ENCOUNTER — Ambulatory Visit: Payer: POS | Admitting: Gastroenterology

## 2016-11-11 VITALS — BP 125/67 | HR 93 | Temp 98.4°F | Ht 65.0 in | Wt 227.0 lb

## 2016-11-11 DIAGNOSIS — K5900 Constipation, unspecified: Secondary | ICD-10-CM | POA: Diagnosis not present

## 2016-11-11 DIAGNOSIS — R109 Unspecified abdominal pain: Secondary | ICD-10-CM | POA: Diagnosis not present

## 2016-11-11 NOTE — Progress Notes (Signed)
Gastroenterology Consultation  Referring Provider:     Reubin Milan, MD Primary Care Physician:  Reubin Milan, MD Primary Gastroenterologist:  Dr. Servando Snare     Reason for Consultation:     Abdominal pain        HPI:   Carly Martin is a 31 y.o. y/o female referred for consultation & management of Abdominal pain by Dr. Judithann Graves, Nyoka Cowden, MD.  This patient reports that she has had abdominal pain for approximately 1 year.  The patient states her abdominal pain is in the area of the right midaxillary line.  She reports that eating and drinking did not make her pain any better or worse.  She also reports the pains be a burning type pain.  There is no report of any nausea vomiting associated with the pain.  She also denies any change in bowel habits but does state she is been constipated since she was a young girl.  There is some relief of the pain when she moves her bowels.  The patient denies any unexplained weight loss but does state that her weight fluctuates up and down properly 4 pounds.  There is no history of any first-degree relatives with colon cancer or colon polyps.      Past Medical History:  Diagnosis Date  . Asthma          Past Surgical History:  Procedure Laterality Date  . CERVICAL BIOPSY  W/ LOOP ELECTRODE EXCISION  2008  . TONSILLECTOMY             Prior to Admission medications   Medication Sig Start Date End Date Taking? Authorizing Provider  albuterol (PROVENTIL HFA;VENTOLIN HFA) 108 (90 Base) MCG/ACT inhaler Inhale 1-2 puffs into the lungs every 6 (six) hours as needed for wheezing or shortness of breath. 10/26/16  Yes Domenick Gong, MD  amoxicillin-clavulanate (AUGMENTIN) 875-125 MG tablet Take 1 tablet by mouth 2 (two) times daily. 11/05/16 11/15/16 Yes Reubin Milan, MD  chlorpheniramine-HYDROcodone (TUSSIONEX) 10-8 MG/5ML SUER Take 5 mLs by mouth Nightly.  10/26/16  Yes [provider]  etonogestrel (IMPLANON) 68 MG IMPL  implant 1 each by Subdermal route once.   Yes [provider]  fluticasone (FLONASE) 50 MCG/ACT nasal spray Place 2 sprays into both nostrils daily. 10/26/16  Yes Domenick Gong, MD  fluconazole (DIFLUCAN) 100 MG tablet Take 1 tablet (100 mg total) by mouth daily. Patient not taking: Reported on 11/11/2016 11/05/16   Reubin Milan, MD         Family History  Problem Relation Age of Onset  . Migraines Sister             Social History   Substance Use Topics   . Smoking status: Former Games developer   . Smokeless tobacco: Never Used   . Alcohol use 0.0 oz/week     Comment: socially          Allergies as of 11/11/2016 - Review Complete 11/11/2016  Allergen Reaction Noted  . Codeine Shortness Of Breath 03/17/2015    Review of Systems:    All systems reviewed and negative except where noted in HPI.   Physical Exam:  BP 125/67   Pulse 93   Temp 98.4 F (36.9 C) (Oral)   Ht  (1.651 m)   Wt 227 lb (103 kg)   LMP 10/22/2016 (Exact Date)   BMI 37.77 kg/m  Patient's last menstrual period was 10/22/2016 (exact date). Psych:  Alert and cooperative. Normal  mood and affect. General:   Alert,  Well-developed, well-nourished, pleasant and cooperative in NAD Head:  Normocephalic and atraumatic. Eyes:  Sclera clear, no icterus.   Conjunctiva pink. Ears:  Normal auditory acuity. Nose:  No deformity, discharge, or lesions. Mouth:  No deformity or lesions,oropharynx pink & moist. Neck:  Supple; no masses or thyromegaly. Lungs:  Respirations even and unlabored.  Clear throughout to auscultation.   No wheezes, crackles, or rhonchi. No acute distress. Heart:  Regular rate and rhythm; no murmurs, clicks, rubs, or gallops. Abdomen:  Normal bowel sounds.  No bruits.  Soft, Positive tenderness in the right middle abdomen that does not appear to be musculoskeletal and non-distended without masses, hepatosplenomegaly or hernias noted.  No guarding or rebound  tenderness.  Negative Carnett sign.   Rectal:  Deferred.  Msk:  Symmetrical without gross deformities.  Good, equal movement & strength bilaterally. Pulses:  Normal pulses noted. Extremities:  No clubbing or edema.  No cyanosis. Neurologic:  Alert and oriented x3;  grossly normal neurologically. Skin:  Intact without significant lesions or rashes.  No jaundice. Lymph Nodes:  No significant cervical adenopathy. Psych:  Alert and cooperative. Normal mood and affect.  Imaging Studies: Imaging Results  No results found.    Assessment and Plan:   Carly Martin is a 31 y.o. y/o female with abdominal pain in the right abdomen and a report of the pain getting better with bowel movements. The patient reports that she moves her bowels probably once every 3-4 days.  The patient will be started on a trial of Linzess.  She has been given both the 72 and 145 g doses to try.  If moving her bowels more frequently resolves her pain that she will continue with this or a similar medication to keep her bowels moving.  If her symptoms do not improve she may need to be set up for a CT scan of the abdomen.  The patient has been explained the plan and agrees with it.  Midge Minium, MD. Clementeen Graham   Note: This dictation was prepared with Dragon dictation along with smaller phrase technology. Any transcriptional errors that result from this process are unintentional.

## 2016-11-11 NOTE — Consult Note (Signed)
Gastroenterology Consultation  Referring Provider:     Reubin Milan, MD Primary Care Physician:  Reubin Milan, MD Primary Gastroenterologist:  Dr. Servando Snare     Reason for Consultation:     Abdominal pain        HPI:   Carly Martin is a 31 y.o. y/o female referred for consultation & management of Abdominal pain by Dr. Judithann Graves, Nyoka Cowden, MD.  This patient reports that she has had abdominal pain for approximately 1 year.  The patient states her abdominal pain is in the area of the right midaxillary line.  She reports that eating and drinking did not make her pain any better or worse.  She also reports the pains be a burning type pain.  There is no report of any nausea vomiting associated with the pain.  She also denies any change in bowel habits but does state she is been constipated since she was a young girl.  There is some relief of the pain when she moves her bowels.  The patient denies any unexplained weight loss but does state that her weight fluctuates up and down properly 4 pounds.  There is no history of any first-degree relatives with colon cancer or colon polyps.  Past Medical History:  Diagnosis Date  . Asthma     Past Surgical History:  Procedure Laterality Date  . CERVICAL BIOPSY  W/ LOOP ELECTRODE EXCISION  2008  . TONSILLECTOMY      Prior to Admission medications   Medication Sig Start Date End Date Taking? Authorizing Provider  albuterol (PROVENTIL HFA;VENTOLIN HFA) 108 (90 Base) MCG/ACT inhaler Inhale 1-2 puffs into the lungs every 6 (six) hours as needed for wheezing or shortness of breath. 10/26/16  Yes Domenick Gong, MD  amoxicillin-clavulanate (AUGMENTIN) 875-125 MG tablet Take 1 tablet by mouth 2 (two) times daily. 11/05/16 11/15/16 Yes Reubin Milan, MD  chlorpheniramine-HYDROcodone (TUSSIONEX) 10-8 MG/5ML SUER Take 5 mLs by mouth Nightly.  10/26/16  Yes [provider]  etonogestrel (IMPLANON) 68 MG IMPL implant 1 each by Subdermal route  once.   Yes [provider]  fluticasone (FLONASE) 50 MCG/ACT nasal spray Place 2 sprays into both nostrils daily. 10/26/16  Yes Domenick Gong, MD  fluconazole (DIFLUCAN) 100 MG tablet Take 1 tablet (100 mg total) by mouth daily. Patient not taking: Reported on 11/11/2016 11/05/16   Reubin Milan, MD    Family History  Problem Relation Age of Onset  . Migraines Sister      Social History  Substance Use Topics  . Smoking status: Former Games developer  . Smokeless tobacco: Never Used  . Alcohol use 0.0 oz/week     Comment: socially    Allergies as of 11/11/2016 - Review Complete 11/11/2016  Allergen Reaction Noted  . Codeine Shortness Of Breath 03/17/2015    Review of Systems:    All systems reviewed and negative except where noted in HPI.   Physical Exam:  BP 125/67   Pulse 93   Temp 98.4 F (36.9 C) (Oral)   Ht 5\' 5"  (1.651 m)   Wt 227 lb (103 kg)   LMP 10/22/2016 (Exact Date)   BMI 37.77 kg/m  Patient's last menstrual period was 10/22/2016 (exact date). Psych:  Alert and cooperative. Normal mood and affect. General:   Alert,  Well-developed, well-nourished, pleasant and cooperative in NAD Head:  Normocephalic and atraumatic. Eyes:  Sclera clear, no icterus.   Conjunctiva pink. Ears:  Normal auditory acuity. Nose:  No deformity, discharge, or lesions. Mouth:  No deformity or lesions,oropharynx pink & moist. Neck:  Supple; no masses or thyromegaly. Lungs:  Respirations even and unlabored.  Clear throughout to auscultation.   No wheezes, crackles, or rhonchi. No acute distress. Heart:  Regular rate and rhythm; no murmurs, clicks, rubs, or gallops. Abdomen:  Normal bowel sounds.  No bruits.  Soft, Positive tenderness in the right middle abdomen that does not appear to be musculoskeletal and non-distended without masses, hepatosplenomegaly or hernias noted.  No guarding or rebound tenderness.  Negative Carnett sign.   Rectal:  Deferred.  Msk:  Symmetrical without  gross deformities.  Good, equal movement & strength bilaterally. Pulses:  Normal pulses noted. Extremities:  No clubbing or edema.  No cyanosis. Neurologic:  Alert and oriented x3;  grossly normal neurologically. Skin:  Intact without significant lesions or rashes.  No jaundice. Lymph Nodes:  No significant cervical adenopathy. Psych:  Alert and cooperative. Normal mood and affect.  Imaging Studies: No results found.  Assessment and Plan:   Carly Martin is a 31 y.o. y/o female with abdominal pain in the right abdomen and a report of the pain getting better with bowel movements. The patient reports that she moves her bowels probably once every 3-4 days.  The patient will be started on a trial of Linzess.  She has been given both the 72 and 145 g doses to try.  If moving her bowels more frequently resolves her pain that she will continue with this or a similar medication to keep her bowels moving.  If her symptoms do not improve she may need to be set up for a CT scan of the abdomen.  The patient has been explained the plan and agrees with it.  Midge Minium, MD. Clementeen Graham   Note: This dictation was prepared with Dragon dictation along with smaller phrase technology. Any transcriptional errors that result from this process are unintentional.

## 2016-11-18 ENCOUNTER — Encounter: Payer: Self-pay | Admitting: Internal Medicine

## 2016-11-19 ENCOUNTER — Other Ambulatory Visit: Payer: Self-pay | Admitting: Internal Medicine

## 2016-11-19 MED ORDER — PREDNISONE 10 MG PO TABS: ORAL_TABLET | ORAL | 0 refills | Status: AC

## 2016-11-19 MED ORDER — AMOXICILLIN-POT CLAVULANATE 875-125 MG PO TABS: 1.0000 | ORAL_TABLET | Freq: Two times a day (BID) | ORAL | 0 refills | Status: AC

## 2016-11-19 NOTE — Telephone Encounter (Signed)
Patient still not feeling better from sinus infection. Please Advise.

## 2016-12-03 ENCOUNTER — Encounter: Payer: Self-pay | Admitting: Internal Medicine

## 2017-02-15 ENCOUNTER — Encounter: Payer: Self-pay | Admitting: Internal Medicine

## 2017-02-15 NOTE — Telephone Encounter (Signed)
Patient Mychart message. Please Advise.

## 2017-02-26 ENCOUNTER — Other Ambulatory Visit: Payer: Self-pay

## 2017-02-26 ENCOUNTER — Ambulatory Visit
Admission: EM | Admit: 2017-02-26 | Discharge: 2017-02-26 | Disposition: A | Discharge status: Home / Self Care | Payer: POS | Attending: Family Medicine | Admitting: Family Medicine

## 2017-02-26 ENCOUNTER — Encounter: Payer: Self-pay | Admitting: Gynecology

## 2017-02-26 DIAGNOSIS — R51 Headache: Secondary | ICD-10-CM | POA: Diagnosis not present

## 2017-02-26 DIAGNOSIS — M7918 Myalgia, other site: Secondary | ICD-10-CM | POA: Diagnosis not present

## 2017-02-26 DIAGNOSIS — M5489 Other dorsalgia: Secondary | ICD-10-CM | POA: Diagnosis not present

## 2017-02-26 DIAGNOSIS — M542 Cervicalgia: Secondary | ICD-10-CM
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
## 2017-02-26 NOTE — Discharge Instructions (Signed)
Rest. Tylenol, motrin, heat.   Take care  Dr. Adriana Simas

## 2017-03-02 ENCOUNTER — Encounter: Payer: Self-pay | Admitting: Internal Medicine

## 2017-03-02 ENCOUNTER — Ambulatory Visit (INDEPENDENT_AMBULATORY_CARE_PROVIDER_SITE_OTHER): Payer: Managed Care, Other (non HMO) | Admitting: Internal Medicine

## 2017-03-02 VITALS — BP 118/70 | HR 85 | Ht 65.0 in | Wt 219.0 lb

## 2017-03-02 DIAGNOSIS — F331 Major depressive disorder, recurrent, moderate: Secondary | ICD-10-CM | POA: Diagnosis not present

## 2017-03-02 DIAGNOSIS — F17200 Nicotine dependence, unspecified, uncomplicated: Secondary | ICD-10-CM | POA: Diagnosis not present

## 2017-03-02 DIAGNOSIS — S161XXA Strain of muscle, fascia and tendon at neck level, initial encounter: Secondary | ICD-10-CM | POA: Diagnosis not present

## 2017-03-02 MED ORDER — ESCITALOPRAM OXALATE 10 MG PO TABS
10.0000 mg | ORAL_TABLET | Freq: Every day | ORAL | 1 refills | Status: DC
Start: 2017-03-02 — End: 2017-06-30
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
## 2017-03-04 ENCOUNTER — Other Ambulatory Visit: Payer: Self-pay

## 2017-03-04 DIAGNOSIS — S161XXA Strain of muscle, fascia and tendon at neck level, initial encounter: Secondary | ICD-10-CM
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
## 2017-03-16 ENCOUNTER — Ambulatory Visit: Payer: POS | Attending: Internal Medicine | Admitting: Physical Therapy

## 2017-03-16 ENCOUNTER — Encounter: Payer: Self-pay | Admitting: Physical Therapy

## 2017-03-16 DIAGNOSIS — M542 Cervicalgia: Secondary | ICD-10-CM
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
## 2017-03-22 ENCOUNTER — Ambulatory Visit: Payer: POS | Admitting: Physical Therapy

## 2017-03-24 ENCOUNTER — Ambulatory Visit: Payer: POS | Admitting: Physical Therapy

## 2017-03-24 ENCOUNTER — Encounter: Payer: Self-pay | Admitting: Physical Therapy

## 2017-03-24 DIAGNOSIS — M542 Cervicalgia: Secondary | ICD-10-CM

## 2017-03-24 NOTE — Therapy (Signed)
[place] [facility] [address] [place], [address] Phone: [phone]   Fax:  [phone]  Physical Therapy Treatment  Patient Details  Name: Carly Martin MRN: [id number] Date of Birth: Mar 20, 1985 Referring Provider: Bari Edward MD   Encounter Date: 03/24/2017    Past Medical History:  Diagnosis Date  . Asthma     Past Surgical History:  Procedure Laterality Date  . CERVICAL BIOPSY  W/ LOOP ELECTRODE EXCISION  2008  . TONSILLECTOMY      There were no vitals filed for this visit.    Manual -C1-2 UPAs grade III 30sec bouts 12 bouts 8 on L 3 on R -STM and trigger point release to bilat UT and levator scap w/ noted tissue release. R UT trigger points > L -Cervical traction 10sec traction/10sec release x3 in neutral x4 with ext -L Lateral glide grade III to C2   Ther-Ex -HEP review doorway stretch/UT stretch/scap retraction -Seated rows w/ yellow tband 3x 10 w/ cuing for posture -Supine pec stretch on  foam roll                         PT Short Term Goals - 03/17/17 1437      PT SHORT TERM GOAL #1   Title  Pt will be independent with HEP in order to improve strength and balance in order to decrease fall risk and improve function at home and work.    Time  4    Period  Weeks    Status  New        PT Long Term Goals - 03/17/17 1437      PT LONG TERM GOAL #1   Title  Patient will demonstrate 85d of cervical rotation w/o pain bilat in order to drive safely    Baseline  2/13 L: 49d R: 69d    Time  8    Period  Weeks    Status  New      PT LONG TERM GOAL #2   Title  Pt will decrease worst pain as reported on NPRS by at least 3 points in order to demonstrate clinically significant reduction in pain in order to be able to play with her children    Baseline  2/13 Worst pain 6/10    Time  8      PT LONG TERM GOAL #3   Title  Pt will demonstrate decrease in NDI  by at least 19% in order to demonstrate clinically significant reduction in disability related to neck injury/pain    Baseline  2/13 44%              Patient will benefit from skilled therapeutic intervention in order to improve the following deficits and impairments:     Visit Diagnosis: Cervicalgia     Problem List Patient Active Problem List   Diagnosis Date Noted  . Right upper quadrant abdominal pain 09/28/2016  . Irritable bowel syndrome with constipation 09/28/2016  . Moderate episode of recurrent major depressive disorder (HCC) 09/28/2016  . Lightheadedness 03/03/2016  . Numbness and tingling 09/03/2015  . Chronic tension-type headache, not intractable 09/03/2015  . Sciatica of right side 03/17/2015  . Iron deficiency anemia 03/17/2015  . History of loop electrical excision procedure (LEEP) 09/28/2013   Staci Acosta PT, DPT  Staci Acosta 04/06/2017, 4:01 PM  [place] [facility] SPORTS MEDICINE [address]  [place], [place], [address] Phone: [phone]   Fax:  [phone]  Name: Carly Martin MRN: [id number] Date of Birth: March 28, 1985

## 2017-03-28 ENCOUNTER — Ambulatory Visit: Payer: POS | Admitting: Physical Therapy

## 2017-03-28 ENCOUNTER — Encounter: Payer: Self-pay | Admitting: Physical Therapy

## 2017-03-28 DIAGNOSIS — M542 Cervicalgia: Secondary | ICD-10-CM | POA: Diagnosis not present
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
## 2017-03-30 ENCOUNTER — Ambulatory Visit: Payer: POS | Admitting: Physical Therapy

## 2017-04-01 ENCOUNTER — Ambulatory Visit: Payer: Managed Care, Other (non HMO) | Admitting: Internal Medicine

## 2017-04-05 ENCOUNTER — Ambulatory Visit: Payer: POS | Admitting: Physical Therapy

## 2017-04-06 ENCOUNTER — Ambulatory Visit: Payer: POS | Attending: Physician Assistant | Admitting: Physical Therapy

## 2017-04-06 DIAGNOSIS — M542 Cervicalgia: Secondary | ICD-10-CM | POA: Diagnosis present
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
## 2017-04-12 ENCOUNTER — Encounter: Payer: POS | Admitting: Physical Therapy

## 2017-04-15 ENCOUNTER — Ambulatory Visit: Payer: POS | Admitting: Physical Therapy

## 2017-04-19 ENCOUNTER — Ambulatory Visit: Payer: POS | Admitting: Physical Therapy

## 2017-04-21 ENCOUNTER — Ambulatory Visit: Payer: POS | Admitting: Physical Therapy

## 2017-04-26 ENCOUNTER — Ambulatory Visit: Payer: POS | Admitting: Physical Therapy

## 2017-04-28 ENCOUNTER — Ambulatory Visit: Payer: POS | Admitting: Physical Therapy

## 2017-05-03 ENCOUNTER — Ambulatory Visit: Payer: POS | Admitting: Physical Therapy

## 2017-05-05 ENCOUNTER — Encounter: Payer: POS | Admitting: Physical Therapy

## 2017-05-10 ENCOUNTER — Encounter: Payer: POS | Admitting: Physical Therapy

## 2017-05-12 ENCOUNTER — Encounter: Payer: POS | Admitting: Physical Therapy

## 2017-05-30 ENCOUNTER — Ambulatory Visit: Payer: Managed Care, Other (non HMO) | Admitting: Internal Medicine

## 2017-05-30 NOTE — Progress Notes (Deleted)
    Date:  05/30/2017   Name:  Carly Martin   DOB:  Oct 08, 1985   MRN:  [id number]   Chief Complaint: No chief complaint on file. Depression         This is a recurrent problem.  The current episode started more than 1 year ago.   Past treatments include SSRIs - Selective serotonin reuptake inhibitors (started on Lexapro in January).     Review of Systems  Psychiatric/Behavioral: Positive for depression.    Patient Active Problem List   Diagnosis Date Noted  . Right upper quadrant abdominal pain 09/28/2016  . Irritable bowel syndrome with constipation 09/28/2016  . Moderate episode of recurrent major depressive disorder (HCC) 09/28/2016  . Lightheadedness 03/03/2016  . Numbness and tingling 09/03/2015  . Chronic tension-type headache, not intractable 09/03/2015  . Sciatica of right side 03/17/2015  . Iron deficiency anemia 03/17/2015  . History of loop electrical excision procedure (LEEP) 09/28/2013    Prior to Admission medications   Medication Sig Start Date End Date Taking? Authorizing Provider  albuterol (PROVENTIL HFA;VENTOLIN HFA) 108 (90 Base) MCG/ACT inhaler Inhale 1-2 puffs into the lungs every 6 (six) hours as needed for wheezing or shortness of breath. 10/26/16   Domenick Gong, MD  escitalopram (LEXAPRO) 10 MG tablet Take 1 tablet (10 mg total) by mouth at bedtime. 03/02/17   Reubin Milan, MD  etonogestrel (IMPLANON) 68 MG IMPL implant 1 each by Subdermal route once.    [provider]  fluticasone (FLONASE) 50 MCG/ACT nasal spray Place 2 sprays into both nostrils daily. 10/26/16   Domenick Gong, MD    Allergies  Allergen Reactions  . Codeine Shortness Of Breath    Past Surgical History:  Procedure Laterality Date  . CERVICAL BIOPSY  W/ LOOP ELECTRODE EXCISION  2008  . TONSILLECTOMY      Social History   Tobacco Use  . Smoking status: Former Games developer  . Smokeless tobacco: Never Used  Substance Use Topics  . Alcohol use: Yes   Alcohol/week: 0.0 oz    Comment: socially  . Drug use: No     Medication list has been reviewed and updated.  PHQ 2/9 Scores 03/02/2017 09/28/2016  PHQ - 2 Score 6 6  PHQ- 9 Score 22 16    Physical Exam  There were no vitals taken for this visit.  Assessment and Plan:

## 2017-06-30 ENCOUNTER — Encounter: Payer: Self-pay | Admitting: Emergency Medicine

## 2017-06-30 ENCOUNTER — Ambulatory Visit
Admission: EM | Admit: 2017-06-30 | Discharge: 2017-06-30 | Disposition: A | Discharge status: Home / Self Care | Payer: POS

## 2017-06-30 ENCOUNTER — Other Ambulatory Visit: Payer: Self-pay

## 2017-06-30 DIAGNOSIS — M6281 Muscle weakness (generalized): Secondary | ICD-10-CM | POA: Diagnosis not present

## 2017-06-30 DIAGNOSIS — M7918 Myalgia, other site: Secondary | ICD-10-CM

## 2017-06-30 NOTE — Discharge Instructions (Signed)
To follow-up with Dr. Judithann Graves your primary care physician next week

## 2017-06-30 NOTE — ED Provider Notes (Signed)
[facility]    CSN: [id number] Arrival date & time: 06/30/17  1646     History   Chief Complaint Chief Complaint  Patient presents with  . Muscle Pain    HPI Carly Martin is a 32 y.o. female.   HPI  Pleasant 32 year old female who presents with a 3-year history of on again off again migratory muscle pains with numbness and tingling affecting her extremities.  She also has had loss of vision on occasion.  Morning she awoke with swelling in her hands however was able to remove her  her ring easily ; she really was not swelling only had the perception.  She felt pain "down to her bone".  The pain has moved up her left arm down through to her left leg.  She tried Aleve and ibuprofen with no relief.  States that her left extremity is weak and she is unable to drive or hold anything on her left side.  Had numerous work-ups through Dr. Judithann Graves and a neurologist performing EMG studies but nothing was found.  She was placed on gabapentin which she was unable tolerate and Dr. Judithann Graves discontinued that use.  Nothing has seem to work for her.  Seems very discouraged.       Past Medical History:  Diagnosis Date  . Asthma     Patient Active Problem List   Diagnosis Date Noted  . Right upper quadrant abdominal pain 09/28/2016  . Irritable bowel syndrome with constipation 09/28/2016  . Moderate episode of recurrent major depressive disorder (HCC) 09/28/2016  . Lightheadedness 03/03/2016  . Numbness and tingling 09/03/2015  . Chronic tension-type headache, not intractable 09/03/2015  . Sciatica of right side 03/17/2015  . Iron deficiency anemia 03/17/2015  . History of loop electrical excision procedure (LEEP) 09/28/2013    Past Surgical History:  Procedure Laterality Date  . CERVICAL BIOPSY  W/ LOOP ELECTRODE EXCISION  2008  . TONSILLECTOMY      OB History   None      Home Medications    Prior to Admission medications   Medication Sig Start Date End  Date Taking? Authorizing Provider  albuterol (PROVENTIL HFA;VENTOLIN HFA) 108 (90 Base) MCG/ACT inhaler Inhale 1-2 puffs into the lungs every 6 (six) hours as needed for wheezing or shortness of breath. 10/26/16  Yes Domenick Gong, MD  etonogestrel (IMPLANON) 68 MG IMPL implant 1 each by Subdermal route once.   Yes [provider]  fluticasone (FLONASE) 50 MCG/ACT nasal spray Place 2 sprays into both nostrils daily. 10/26/16  Yes Domenick Gong, MD    Family History Family History  Problem Relation Age of Onset  . Migraines Sister     Social History Social History   Tobacco Use  . Smoking status: Former Games developer  . Smokeless tobacco: Never Used  Substance Use Topics  . Alcohol use: Yes    Alcohol/week: 0.0 oz    Comment: socially  . Drug use: No     Allergies   Codeine   Review of Systems Review of Systems  Constitutional: Positive for activity change. Negative for chills, fatigue and fever.  Musculoskeletal: Positive for arthralgias and myalgias.  Neurological: Positive for weakness.  All other systems reviewed and are negative.    Physical Exam Triage Vital Signs ED Triage Vitals  Enc Vitals Group     BP 06/30/17 1700 126/80     Pulse Rate 06/30/17 1700 80     Resp 06/30/17 1700 18     Temp  06/30/17 1700 98.4 F (36.9 C)     Temp Source 06/30/17 1700 Oral     SpO2 06/30/17 1700 100 %     Weight 06/30/17 1702 215 lb (97.5 kg)     Height 06/30/17 1702 5\' 5"  (1.651 m)     Head Circumference --      Peak Flow --      Pain Score 06/30/17 1701 6     Pain Loc --      Pain Edu? --      Excl. in GC? --    No data found.  Updated Vital Signs BP 126/80 (BP Location: Right Arm)   Pulse 80   Temp 98.4 F (36.9 C) (Oral)   Resp 18   Ht 5\' 5"  (1.651 m)   Wt 215 lb (97.5 kg)   LMP 06/17/2017 (Exact Date)   SpO2 100%   BMI 35.78 kg/m   Visual Acuity Right Eye Distance:   Left Eye Distance:   Bilateral Distance:    Right Eye Near:   Left Eye  Near:    Bilateral Near:     Physical Exam  Constitutional: She is oriented to person, place, and time. She appears well-developed and well-nourished. No distress.  HENT:  Head: Normocephalic.  Right Ear: External ear normal.  Left Ear: External ear normal.  Nose: Nose normal.  Mouth/Throat: Oropharynx is clear and moist. No oropharyngeal exudate.  Eyes: Pupils are equal, round, and reactive to light. Right eye exhibits no discharge. Left eye exhibits no discharge.  Neck: Normal range of motion. Neck supple.  Musculoskeletal: Normal range of motion. She exhibits no edema or tenderness.  Neurological: She is alert and oriented to person, place, and time. She displays normal reflexes. No cranial nerve deficit or sensory deficit. She exhibits normal muscle tone.  Skin: Skin is warm and dry. She is not diaphoretic.  Psychiatric: She has a normal mood and affect. Her behavior is normal. Judgment and thought content normal.  Nursing note and vitals reviewed.    UC Treatments / Results  Labs (all labs ordered are listed, but only abnormal results are displayed) Labs Reviewed - No data to display  EKG None  Radiology No results found.  Procedures Procedures (including critical care time)  Medications Ordered in UC Medications - No data to display  Initial Impression / Assessment and Plan / UC Course  I have reviewed the triage vital signs and the nursing notes.  Pertinent labs & imaging results that were available during my care of the patient were reviewed by me and considered in my medical decision making (see chart for details).     Plan: 1. Test/x-ray results and diagnosis reviewed with patient 2. rx as per orders; risks, benefits, potential side effects reviewed with patient 3. Recommend supportive treatment with Aleve or Motrin as necessary for pain.  Recommend that she follow-up with Dr. Judithann Graves for further evaluation and treatment possible referral to [facility] or [facility] for  second neurological evaluation. States that she will make an appointment see Dr. Judithann Graves next week. 4. F/u prn if symptoms worsen or don't improve  Final Clinical Impressions(s) / UC Diagnoses   Final diagnoses:  Muscle pain, myofascial  Muscle weakness of left upper extremity     Discharge Instructions     To follow-up with Dr. Judithann Graves your primary care physician next week    ED Prescriptions    None     Controlled Substance Prescriptions [place] Controlled Substance Registry consulted? Not Applicable  Lutricia Feil, PA-C 06/30/17 1745

## 2017-06-30 NOTE — ED Triage Notes (Signed)
Patient c/o feeling like her hands were swelling this morning but no actual swelling. C/o pain "down to the bone." Pain has now moved up her left arm and down through her left leg. Patient has tried Aleve and Ibuprofen with no relief. States left extremity is weak and she is unable to drive or hold anything on her left side. Patient states this has been going on for 3 years.

## 2017-07-03 ENCOUNTER — Emergency Department: Payer: POS

## 2017-07-03 ENCOUNTER — Encounter: Payer: Self-pay | Admitting: Medical Oncology

## 2017-07-03 ENCOUNTER — Other Ambulatory Visit: Payer: Self-pay

## 2017-07-03 ENCOUNTER — Inpatient Hospital Stay
Admission: EM | Admit: 2017-07-03 | Discharge: 2017-07-05 | DRG: 060 | Disposition: A | Discharge status: Home / Self Care | Payer: POS | Attending: Internal Medicine | Admitting: Internal Medicine

## 2017-07-03 DIAGNOSIS — Z7951 Long term (current) use of inhaled steroids: Secondary | ICD-10-CM

## 2017-07-03 DIAGNOSIS — G378 Other specified demyelinating diseases of central nervous system: Secondary | ICD-10-CM | POA: Diagnosis present

## 2017-07-03 DIAGNOSIS — R202 Paresthesia of skin: Secondary | ICD-10-CM

## 2017-07-03 DIAGNOSIS — R531 Weakness: Secondary | ICD-10-CM

## 2017-07-03 DIAGNOSIS — F17210 Nicotine dependence, cigarettes, uncomplicated: Secondary | ICD-10-CM | POA: Diagnosis present

## 2017-07-03 DIAGNOSIS — G44209 Tension-type headache, unspecified, not intractable: Secondary | ICD-10-CM | POA: Diagnosis present

## 2017-07-03 DIAGNOSIS — Z793 Long term (current) use of hormonal contraceptives: Secondary | ICD-10-CM | POA: Diagnosis not present

## 2017-07-03 DIAGNOSIS — J45909 Unspecified asthma, uncomplicated: Secondary | ICD-10-CM | POA: Diagnosis present

## 2017-07-03 DIAGNOSIS — R200 Anesthesia of skin: Secondary | ICD-10-CM | POA: Diagnosis present

## 2017-07-03 DIAGNOSIS — G35 Multiple sclerosis: Secondary | ICD-10-CM | POA: Diagnosis present

## 2017-07-03 DIAGNOSIS — Z885 Allergy status to narcotic agent status: Secondary | ICD-10-CM

## 2017-07-03 LAB — COMPREHENSIVE METABOLIC PANEL
ALK PHOS: 49 U/L (ref 38–126)
ALT: 15 U/L (ref 14–54)
ANION GAP: 10 (ref 5–15)
AST: 19 U/L (ref 15–41)
Albumin: 4.1 g/dL (ref 3.5–5.0)
BUN: 15 mg/dL (ref 6–20)
CALCIUM: 8.9 mg/dL (ref 8.9–10.3)
CHLORIDE: 105 mmol/L (ref 101–111)
CO2: 21 mmol/L — ABNORMAL LOW (ref 22–32)
CREATININE: 0.68 mg/dL (ref 0.44–1.00)
Glucose, Bld: 103 mg/dL — ABNORMAL HIGH (ref 65–99)
Potassium: 3.9 mmol/L (ref 3.5–5.1)
Sodium: 136 mmol/L (ref 135–145)
Total Bilirubin: 0.8 mg/dL (ref 0.3–1.2)
Total Protein: 7.7 g/dL (ref 6.5–8.1)

## 2017-07-03 LAB — CBC WITH DIFFERENTIAL/PLATELET
Basophils Absolute: 0.1 10*3/uL (ref 0–0.1)
Basophils Relative: 1 %
EOS PCT: 2 %
Eosinophils Absolute: 0.1 10*3/uL (ref 0–0.7)
HCT: 37.3 % (ref 35.0–47.0)
Hemoglobin: 12.3 g/dL (ref 12.0–16.0)
LYMPHS ABS: 2.3 10*3/uL (ref 1.0–3.6)
LYMPHS PCT: 32 %
MCH: 29.3 pg (ref 26.0–34.0)
MCHC: 32.9 g/dL (ref 32.0–36.0)
MCV: 88.8 fL (ref 80.0–100.0)
MONOS PCT: 6 %
Monocytes Absolute: 0.5 10*3/uL (ref 0.2–0.9)
Neutro Abs: 4.4 10*3/uL (ref 1.4–6.5)
Neutrophils Relative %: 59 %
PLATELETS: 290 10*3/uL (ref 150–440)
RBC: 4.2 MIL/uL (ref 3.80–5.20)
RDW: 14.3 % (ref 11.5–14.5)
WBC: 7.4 10*3/uL (ref 3.6–11.0)

## 2017-07-03 LAB — MAGNESIUM: MAGNESIUM: 1.8 mg/dL (ref 1.7–2.4)

## 2017-07-03 MED ORDER — ALBUTEROL SULFATE (2.5 MG/3ML) 0.083% IN NEBU: 2.5000 mg | INHALATION_SOLUTION | Freq: Four times a day (QID) | RESPIRATORY_TRACT | Status: DC | PRN

## 2017-07-03 MED ORDER — SODIUM CHLORIDE 0.9 % IV SOLN
1000.0000 mg | Freq: Every day | INTRAVENOUS | Status: DC
  Administered 2017-07-03 – 2017-07-05 (×3): 1000 mg via INTRAVENOUS
  Filled 2017-07-03 (×4): qty 8

## 2017-07-03 MED ORDER — ACETAMINOPHEN 650 MG RE SUPP: 650.0000 mg | Freq: Four times a day (QID) | RECTAL | Status: DC | PRN

## 2017-07-03 MED ORDER — ENOXAPARIN SODIUM 40 MG/0.4ML ~~LOC~~ SOLN: 40.0000 mg | SUBCUTANEOUS | Status: DC

## 2017-07-03 MED ORDER — ONDANSETRON HCL 4 MG PO TABS: 4.0000 mg | ORAL_TABLET | Freq: Four times a day (QID) | ORAL | Status: DC | PRN

## 2017-07-03 MED ORDER — ONDANSETRON HCL 4 MG/2ML IJ SOLN: 4.0000 mg | Freq: Four times a day (QID) | INTRAMUSCULAR | Status: DC | PRN

## 2017-07-03 MED ORDER — ACETAMINOPHEN 325 MG PO TABS: 650.0000 mg | ORAL_TABLET | Freq: Four times a day (QID) | ORAL | Status: DC | PRN

## 2017-07-03 MED ORDER — ALBUTEROL SULFATE HFA 108 (90 BASE) MCG/ACT IN AERS: 1.0000 | INHALATION_SPRAY | Freq: Four times a day (QID) | RESPIRATORY_TRACT | Status: DC | PRN

## 2017-07-03 NOTE — ED Notes (Signed)
Pt given cup of water 

## 2017-07-03 NOTE — ED Notes (Signed)
VOL, SOC called 

## 2017-07-03 NOTE — ED Triage Notes (Signed)
Pt ambulatory to triage with reports of numbness/tingling that has been off and on for 3 years. Pt states that it began again 3 days ago. Pt states that she feeling like her left arm, left side of her bottom lip, and bottom of left foot has been feeling numb. No facial droop noted. Pt reports aching pain to area.

## 2017-07-03 NOTE — ED Provider Notes (Signed)
[facility] Emergency Department Provider Note  ____________________________________________   First MD Initiated Contact with Patient 07/03/17 1345     (approximate)  I have reviewed the triage vital signs and the nursing notes.   HISTORY  Chief Complaint Numbness    HPI Carly Martin is a 32 y.o. female with no contributory past medical history who presents for evaluation of intermittent paresthesias and numbness that is been occurring for the last 3 years but seems to be getting worse recently.  She has seen Dr. Malvin Johns in the past and had EMG studies and other work-ups but no specific diagnosis was identified.  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  She also reports some changes in her vision; she has a hard time describing exactly how the vision is changed but she says her vision is not what it used to be.  Most recently, within about the last week, she reports that her left leg was involved and it felt weak enough that she cannot lifted up by itself.  She also reports that the paresthesia in the leg was so bad that she could not feel it for about an hour.  She denies fever/chills, chest pain, shortness of breath, nausea, vomiting, and abdominal pain.  She has had no difficulty urinating and no burning when she urinates.  Only recently did she report weakness in her left arm and left leg, previously it was just the paresthesias.  She denies neck pain and neck stiffness.  Overall she describes the situation as severe and she says "I just want to know what is going on".  She has a follow-up appointment with neurology in about 2 weeks.  Past Medical History:  Diagnosis Date  . Asthma     Patient Active Problem List   Diagnosis Date Noted  . Multiple sclerosis (HCC) 07/03/2017  . Right  upper quadrant abdominal pain 09/28/2016  . Irritable bowel syndrome with constipation 09/28/2016  . Moderate episode of recurrent major depressive disorder (HCC) 09/28/2016  . Lightheadedness 03/03/2016  . Numbness and tingling 09/03/2015  . Chronic tension-type headache, not intractable 09/03/2015  . Sciatica of right side 03/17/2015  . Iron deficiency anemia 03/17/2015  . History of loop electrical excision procedure (LEEP) 09/28/2013    Past Surgical History:  Procedure Laterality Date  . CERVICAL BIOPSY  W/ LOOP ELECTRODE EXCISION  2008  . TONSILLECTOMY      Prior to Admission medications   Medication Sig Start Date End Date Taking? Authorizing Provider  albuterol (PROVENTIL HFA;VENTOLIN HFA) 108 (90 Base) MCG/ACT inhaler Inhale 1-2 puffs into the lungs every 6 (six) hours as needed for wheezing or shortness of breath. 10/26/16  Yes Domenick Gong, MD  etonogestrel (IMPLANON) 68 MG IMPL implant 1 each by Subdermal route once.   Yes [provider]  fluticasone (FLONASE) 50 MCG/ACT nasal spray Place 2 sprays into both nostrils daily. Patient taking differently: Place 2 sprays into both nostrils daily as needed for allergies.  10/26/16  Yes Domenick Gong, MD    Allergies Codeine  Family History  Problem Relation Age of Onset  . Migraines Sister     Social History Social History   Tobacco Use  . Smoking status: Current Every Day Smoker    Packs/day: 0.25    Years: 10.00  Pack years: 2.50    Types: Cigarettes  . Smokeless tobacco: Never Used  Substance Use Topics  . Alcohol use: Yes    Alcohol/week: 0.0 oz    Comment: socially  . Drug use: No    Review of Systems Constitutional: No fever/chills Eyes: Visual changes over the last few weeks. ENT: Hearing changes in the right ear over the last few weeks. Cardiovascular: Denies chest pain. Respiratory: Denies shortness of breath. Gastrointestinal: No abdominal pain.  No nausea, no vomiting.  No  diarrhea.  No constipation. Genitourinary: Negative for dysuria. Musculoskeletal: Negative for neck pain.  Negative for back pain. Integumentary: Negative for rash. Neurological: Intermittent but persistent and worsening neurological symptoms including paresthesias and now weakness in upper and lower extremities as well as vision changes, all as described in HPI.   ____________________________________________   PHYSICAL EXAM:  VITAL SIGNS: ED Triage Vitals [07/03/17 1221]  Enc Vitals Group     BP 132/81     Pulse Rate 87     Resp 18     Temp 98.2 F (36.8 C)     Temp Source Oral     SpO2 100 %     Weight 97.5 kg (215 lb)     Height 1.651 m (5\' 5" )     Head Circumference      Peak Flow      Pain Score 3     Pain Loc      Pain Edu?      Excl. in GC?     Constitutional: Alert and oriented. Well appearing and in no acute distress. Eyes: Conjunctivae are normal.  Pupils are equal and reactive bilaterally Head: Atraumatic. Nose: No congestion/rhinnorhea. Mouth/Throat: Mucous membranes are moist. Neck: No stridor.  No meningeal signs.   Cardiovascular: Normal rate, regular rhythm. Good peripheral circulation. Grossly normal heart sounds. Respiratory: Normal respiratory effort.  No retractions. Lungs CTAB. Gastrointestinal: Soft and nontender. No distention.  Musculoskeletal: No lower extremity tenderness nor edema. No gross deformities of extremities. Neurologic:  Normal speech and language.  Subjective paresthesias in the left arm and leg.  Difficult to appreciate "true" weakness versus diminished effort, but the patient does seem to have a less major muscle group strength in both left arm and left leg.  No ataxia, no dysmetria. Skin:  Skin is warm, dry and intact. No rash noted. Psychiatric: Mood and affect are normal. Speech and behavior are normal.  ____________________________________________   LABS (all labs ordered are listed, but only abnormal results are  displayed)  Labs Reviewed  COMPREHENSIVE METABOLIC PANEL - Abnormal; Notable for the following components:      Result Value   CO2 21 (*)    Glucose, Bld 103 (*)    All other components within normal limits  CBC WITH DIFFERENTIAL/PLATELET  MAGNESIUM  URINALYSIS, ROUTINE W REFLEX MICROSCOPIC  HIV ANTIBODY (ROUTINE TESTING)  BASIC METABOLIC PANEL  CBC  POC URINE PREG, ED   ____________________________________________  EKG  None - EKG not ordered by ED physician ____________________________________________  RADIOLOGY Marylou Mccoy, personally discussed these images and results by phone with the on-call radiologist and used this discussion as part of my medical decision making.   ED MD interpretation: Areas of demyelination consistent with a new diagnosis of multiple sclerosis.  No spinal involvement.  Official radiology report(s): Mr Brain Wo Contrast  Result Date: 07/03/2017 CLINICAL DATA:  Three year history of intermittent numbness and tingling of the extremities. Visual disturbance. Pain in the left arm and  leg. EXAM: MRI HEAD WITHOUT CONTRAST TECHNIQUE: Multiplanar, multiecho pulse sequences of the brain and surrounding structures were obtained without intravenous contrast. COMPARISON:  None. FINDINGS: Brain: Diffusion imaging does not show any acute or subacute infarctions. No other cause of restricted diffusion. The brainstem and cerebellum are normal. There are few scattered foci of T2 and FLAIR signal in the frontal deep and subcortical white matter, right more than left. There are also a few foci of cortical and subcortical involvement in the parietal regions left more than right. This could represent a manifestation of demyelinating disease. The differential diagnosis would be small-vessel ischemic changes and old shear injuries, but demyelinating disease is favored. Vascular: Major vessels at the base of the brain show flow. Skull and upper cervical spine: Negative  Sinuses/Orbits: Clear/normal Other: None IMPRESSION: Abnormal examination showing scattered foci of abnormal T2 and FLAIR signal in the frontal deep and subcortical white matter and in the parietal cortical and subcortical brain. The most likely diagnosis is that of demyelinating disease/multiple sclerosis. The differential diagnosis does include ischemic changes and post traumatic changes. Electronically Signed   By: Paulina Fusi M.D.   On: 07/03/2017 15:17   Mr Cervical Spine Wo Contrast  Result Date: 07/03/2017 CLINICAL DATA:  Intermittent migratory muscle spasms with numbness and tingling in all the extremities for 3 years. Intermittent vision loss. EXAM: MRI CERVICAL AND THORACIC SPINE WITHOUT CONTRAST TECHNIQUE: Multiplanar and multiecho pulse sequences of the cervical spine, to include the craniocervical junction and cervicothoracic junction, and the thoracic spine, were obtained without intravenous contrast. COMPARISON:  None. FINDINGS: MRI CERVICAL SPINE FINDINGS Alignment: Maintained. The neck is in mild flexion or there is mild reversal of the normal cervical lordosis. Vertebrae: Height and signal are normal. Cord: Normal signal throughout. Posterior Fossa, vertebral arteries, paraspinal tissues: Negative. Disc levels: C2-3: Negative. C3-4: Minimal bulge without stenosis. C4-5: Mild disc bulge effaces the ventral thecal sac but the central canal and foramina are open. C5-6: Shallow broad-based disc bulge causes mild central canal narrowing. The foramina are widely patent. C6-7: Minimal disc bulge without stenosis. C7-T1: Negative. MRI THORACIC SPINE FINDINGS Alignment:  Normal. Vertebrae: Height and signal are normal. Cord:  Normal signal throughout. Paraspinal and other soft tissues: Negative. Disc levels: Disc height and hydration are maintained at all levels. There is no central canal or foraminal stenosis. IMPRESSION: Normal-appearing cervical and thoracic cord. No evidence of demyelinating  disease. Mild cervical degenerative disc disease most notable at C5-6 where a shallow disc bulge causes mild central canal narrowing. Normal thoracic spine. Electronically Signed   By: Drusilla Kanner M.D.   On: 07/03/2017 16:39   Mr Thoracic Spine Wo Contrast  Result Date: 07/03/2017 CLINICAL DATA:  Intermittent migratory muscle spasms with numbness and tingling in all the extremities for 3 years. Intermittent vision loss. EXAM: MRI CERVICAL AND THORACIC SPINE WITHOUT CONTRAST TECHNIQUE: Multiplanar and multiecho pulse sequences of the cervical spine, to include the craniocervical junction and cervicothoracic junction, and the thoracic spine, were obtained without intravenous contrast. COMPARISON:  None. FINDINGS: MRI CERVICAL SPINE FINDINGS Alignment: Maintained. The neck is in mild flexion or there is mild reversal of the normal cervical lordosis. Vertebrae: Height and signal are normal. Cord: Normal signal throughout. Posterior Fossa, vertebral arteries, paraspinal tissues: Negative. Disc levels: C2-3: Negative. C3-4: Minimal bulge without stenosis. C4-5: Mild disc bulge effaces the ventral thecal sac but the central canal and foramina are open. C5-6: Shallow broad-based disc bulge causes mild central canal narrowing. The foramina are widely  patent. C6-7: Minimal disc bulge without stenosis. C7-T1: Negative. MRI THORACIC SPINE FINDINGS Alignment:  Normal. Vertebrae: Height and signal are normal. Cord:  Normal signal throughout. Paraspinal and other soft tissues: Negative. Disc levels: Disc height and hydration are maintained at all levels. There is no central canal or foraminal stenosis. IMPRESSION: Normal-appearing cervical and thoracic cord. No evidence of demyelinating disease. Mild cervical degenerative disc disease most notable at C5-6 where a shallow disc bulge causes mild central canal narrowing. Normal thoracic spine. Electronically Signed   By: Drusilla Kanner M.D.   On: 07/03/2017 16:39     ____________________________________________   PROCEDURES  Critical Care performed: No   Procedure(s) performed:   Procedures   ____________________________________________   INITIAL IMPRESSION / ASSESSMENT AND PLAN / ED COURSE  As part of my medical decision making, I reviewed the following data within the electronic MEDICAL RECORD NUMBER Nursing notes reviewed and incorporated, Labs reviewed , Discussed with admitting physician , Discussed with radiologist and A phone consult was requested and obtained from this/these consultant(s) (Neurology)    Differential diagnosis includes, but is not limited to, multiple sclerosis, CVA, infectious process such as tickborne illness, etc.  The long-term nature of the symptoms that are escalating over time, the patient's age and gender, and now with visual changes and muscle weakness makes me most concerned for multiple sclerosis.  The patient's lab work is all within normal limits with no electrolyte abnormalities or evidence of metabolic or endorgan dysfunction.  Vital signs are all stable and within normal limits and afebrile.  I discussed with the patient my recommendation that we obtain MRI of her brain and spine and she agreed with that plan.  I then called radiology and spoke with Dr. Karin Golden, the current neuroradiologist on call, and we discussed the case in detail for me to determine the best studies to order in the emergency setting.  He felt that as an initial diagnostic tool, it was not necessary to use contrast for the MRI, because we are simply looking for changes in the brain in the presence of plaques without charting their progression.  He also recommended that we check the cervical spine and thoracic spine, but since the spinal cord ends in the thoracic spine and there is no strong indication to obtain imaging of the lumbar area, we should get the information we need from cervical spine and thoracic spine images (again without contrast).   I have ordered these studies and we will proceed to determine the appropriate disposition and/or treatment plan.  Clinical Course as of Jul 03 2029  Sun Jul 03, 2017  1527 MR brain concerning for MS.  MR BRAIN WO CONTRAST [CF]  1632 Cervical and thoracic spine results are still pending   [CF]  1646 No evidence of demyelinating disease or plaques on the cervical spine and thoracic spine MRIs.  I am paging the neuro hospitalist to discuss the findings on the MR brain to ask for treatment and disposition recommendations.   [CF]  1704 Spoke with Dr. Lucia Gaskins (neurologist on call) about the MRI results.  She recommended admission and giving Solu-Medrol 1000 mg IV daily x5 days.  Dr. Thad Ranger the neuro hospitalist will consult tomorrow and provide additional evaluation and treatment recommendations.  I have updated the patient and her significant other and answering questions I could.  I then discussed the case in person with Dr. Cherlynn Kaiser with the hospitalist service, discussed the treatment recommendations, and he will admit.   [CF]  Clinical Course User Index [CF] Loleta Rose, MD    ____________________________________________  FINAL CLINICAL IMPRESSION(S) / ED DIAGNOSES  Final diagnoses:  Multiple sclerosis (HCC)  Paresthesias  Weakness     MEDICATIONS GIVEN DURING THIS VISIT:  Medications  methylPREDNISolone sodium succinate (SOLU-MEDROL) 1,000 mg in sodium chloride 0.9 % 50 mL IVPB (0 mg Intravenous Stopped 07/03/17 1832)  acetaminophen (TYLENOL) tablet 650 mg (has no administration in time range)    Or  acetaminophen (TYLENOL) suppository 650 mg (has no administration in time range)  ondansetron (ZOFRAN) tablet 4 mg (has no administration in time range)    Or  ondansetron (ZOFRAN) injection 4 mg (has no administration in time range)  enoxaparin (LOVENOX) injection 40 mg (has no administration in time range)  albuterol (PROVENTIL) (2.5 MG/3ML) 0.083% nebulizer solution 2.5 mg (has  no administration in time range)     ED Discharge Orders    None       Note:  This document was prepared using Dragon voice recognition software and may include unintentional dictation errors.    Loleta Rose, MD 07/03/17 2031

## 2017-07-03 NOTE — ED Notes (Signed)
Pt talking to Lauren from MRI on the phone for pre-screening.

## 2017-07-03 NOTE — ED Notes (Signed)
Pt alert, oriented, speaking in complete sentences. C/o numbness/tingling to L face, L arm, L foot intermittently x 3 years. Describes it as if part of body is asleep. States "my vision has changed over past year and my hearing isn't gone in R ear but it's dull." c/o intermittent HA and intermittent tingling to different parts of body. Sees a neurologist. Has been treated for migraines. Has had negative tests for carpal tunnel. 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 I see a GI doctor who says I have IBS." states saw [facility] a few days for leg because "I didn't feel my leg for about an hour."

## 2017-07-03 NOTE — H&P (Signed)
[facility] [place] at [facility]   PATIENT NAME: Carly Martin    MR#:  [id number]  DATE OF BIRTH:  [birth year]  DATE OF ADMISSION:  07/03/2017  PRIMARY CARE PHYSICIAN: Carly Milan, MD   REQUESTING/REFERRING PHYSICIAN: Dr. Loleta Rose  CHIEF COMPLAINT:   Chief Complaint  Patient presents with  . Numbness  Left lower ext. Weakness, numbness.   HISTORY OF PRESENT ILLNESS:  Carly Martin  is a 32 y.o. female with a known history of migraines, neuropathy who presents to the hospital due to left lower extremity weakness, numbness persistent over the past few days.  Patient says that she has had intermittent neurological symptoms with some lower extremity numbness tingling in the past and has seen neurology with Dr. Malvin Johns and was thought to have migraines and also peripheral neuropathy.  She was given gabapentin, Elavil in the past but has stopped taking those medications and has not seen a neurologist in over 2 years.  She says over the past few days she has had significant left lower extremity weakness numbness and tingling.  She says her legs feel like pins-and-needles and she is having a hard time lifting her left leg and using it to ambulate well.  She also developed some left upper extremity and left-sided facial tingling and numbness too.  Patient is also had some blurry vision over the past few years.  Her last eye exam was over a year ago and it was essentially benign as per the patient.  Since her last eye exam she says she has had some trouble focusing with some blurry vision and also trouble with some contrast and issues with with seeing some colors.  She presents to the ER given the persistence of her left lower extremity symptoms and underwent a MRI of her brain/cervical and thoracic spine.  MRI of the brain was consistent with demyelinating process/possible multiple sclerosis.  Hospitalist services were contacted for further treatment evaluation.  PAST  MEDICAL HISTORY:   Past Medical History:  Diagnosis Date  . Asthma     PAST SURGICAL HISTORY:   Past Surgical History:  Procedure Laterality Date  . CERVICAL BIOPSY  W/ LOOP ELECTRODE EXCISION  2008  . TONSILLECTOMY      SOCIAL HISTORY:   Social History   Tobacco Use  . Smoking status: Current Every Day Smoker    Packs/day: 0.25    Years: 10.00    Pack years: 2.50    Types: Cigarettes  . Smokeless tobacco: Never Used  Substance Use Topics  . Alcohol use: Yes    Alcohol/week: 0.0 oz    Comment: socially    FAMILY HISTORY:   Family History  Problem Relation Age of Onset  . Migraines Sister     DRUG ALLERGIES:   Allergies  Allergen Reactions  . Codeine Shortness Of Breath    REVIEW OF SYSTEMS:   Review of Systems  Constitutional: Negative for fever and weight loss.  HENT: Negative for congestion, nosebleeds and tinnitus.   Eyes: Negative for blurred vision, double vision and redness.  Respiratory: Negative for cough, hemoptysis and shortness of breath.   Cardiovascular: Negative for chest pain, orthopnea, leg swelling and PND.  Gastrointestinal: Negative for abdominal pain, diarrhea, melena, nausea and vomiting.  Genitourinary: Negative for dysuria, hematuria and urgency.  Musculoskeletal: Negative for falls and joint pain.  Neurological: Positive for tingling, sensory change and weakness. Negative for dizziness, focal weakness, seizures and headaches.  Endo/Heme/Allergies: Negative for polydipsia. Does not  bruise/bleed easily.  Psychiatric/Behavioral: Negative for depression and memory loss. The patient is not nervous/anxious.     MEDICATIONS AT HOME:   Prior to Admission medications   Medication Sig Start Date End Date Taking? Authorizing Provider  albuterol (PROVENTIL HFA;VENTOLIN HFA) 108 (90 Base) MCG/ACT inhaler Inhale 1-2 puffs into the lungs every 6 (six) hours as needed for wheezing or shortness of breath. 10/26/16  Yes Domenick Gong, MD   etonogestrel (IMPLANON) 68 MG IMPL implant 1 each by Subdermal route once.   Yes [provider]  fluticasone (FLONASE) 50 MCG/ACT nasal spray Place 2 sprays into both nostrils daily. Patient taking differently: Place 2 sprays into both nostrils daily as needed for allergies.  10/26/16  Yes Domenick Gong, MD      VITAL SIGNS:  Blood pressure (!) 125/49, pulse 95, temperature 98.2 F (36.8 C), temperature source Oral, resp. rate 20, height 5\' 5"  (1.651 m), weight 97.5 kg (215 lb), last menstrual period 06/17/2017, SpO2 97 %.  PHYSICAL EXAMINATION:  Physical Exam  GENERAL:  33 y.o.-year-old patient lying in the bed in no acute distress.  EYES: Pupils equal, round, reactive to light and accommodation. No scleral icterus. Extraocular muscles intact.  HEENT: Head atraumatic, normocephalic. Oropharynx and nasopharynx clear. No oropharyngeal erythema, moist oral mucosa  NECK:  Supple, no jugular venous distention. No thyroid enlargement, no tenderness.  LUNGS: Normal breath sounds bilaterally, no wheezing, rales, rhonchi. No use of accessory muscles of respiration.  CARDIOVASCULAR: S1, S2 RRR. No murmurs, rubs, gallops, clicks.  ABDOMEN: Soft, nontender, nondistended. Bowel sounds present. No organomegaly or mass.  EXTREMITIES: No pedal edema, cyanosis, or clubbing. + 2 pedal & radial pulses b/l.   NEUROLOGIC: Cranial nerves II through XII are intact.  Left upper and lower extremity weakness with 3 out of 5 strength.  Positive sensory change on the left lower and upper extremity.   PSYCHIATRIC: The patient is alert and oriented x 3. SKIN: No obvious rash, lesion, or ulcer.   LABORATORY PANEL:   CBC Recent Labs  Lab 07/03/17 1228  WBC 7.4  HGB 12.3  HCT 37.3  PLT 290   ------------------------------------------------------------------------------------------------------------------  Chemistries  Recent Labs  Lab 07/03/17 1228  NA 136  K 3.9  CL 105  CO2 21*   GLUCOSE 103*  BUN 15  CREATININE 0.68  CALCIUM 8.9  MG 1.8  AST 19  ALT 15  ALKPHOS 49  BILITOT 0.8   ------------------------------------------------------------------------------------------------------------------  Cardiac Enzymes No results for input(s): TROPONINI in the last 168 hours. ------------------------------------------------------------------------------------------------------------------  RADIOLOGY:  Mr Brain Wo Contrast  Result Date: 07/03/2017 CLINICAL DATA:  Three year history of intermittent numbness and tingling of the extremities. Visual disturbance. Pain in the left arm and leg. EXAM: MRI HEAD WITHOUT CONTRAST TECHNIQUE: Multiplanar, multiecho pulse sequences of the brain and surrounding structures were obtained without intravenous contrast. COMPARISON:  None. FINDINGS: Brain: Diffusion imaging does not show any acute or subacute infarctions. No other cause of restricted diffusion. The brainstem and cerebellum are normal. There are few scattered foci of T2 and FLAIR signal in the frontal deep and subcortical white matter, right more than left. There are also a few foci of cortical and subcortical involvement in the parietal regions left more than right. This could represent a manifestation of demyelinating disease. The differential diagnosis would be small-vessel ischemic changes and old shear injuries, but demyelinating disease is favored. Vascular: Major vessels at the base of the brain show flow. Skull and upper cervical spine: Negative Sinuses/Orbits:  Clear/normal Other: None IMPRESSION: Abnormal examination showing scattered foci of abnormal T2 and FLAIR signal in the frontal deep and subcortical white matter and in the parietal cortical and subcortical brain. The most likely diagnosis is that of demyelinating disease/multiple sclerosis. The differential diagnosis does include ischemic changes and post traumatic changes. Electronically Signed   By: Paulina Fusi M.D.    On: 07/03/2017 15:17   Mr Cervical Spine Wo Contrast  Result Date: 07/03/2017 CLINICAL DATA:  Intermittent migratory muscle spasms with numbness and tingling in all the extremities for 3 years. Intermittent vision loss. EXAM: MRI CERVICAL AND THORACIC SPINE WITHOUT CONTRAST TECHNIQUE: Multiplanar and multiecho pulse sequences of the cervical spine, to include the craniocervical junction and cervicothoracic junction, and the thoracic spine, were obtained without intravenous contrast. COMPARISON:  None. FINDINGS: MRI CERVICAL SPINE FINDINGS Alignment: Maintained. The neck is in mild flexion or there is mild reversal of the normal cervical lordosis. Vertebrae: Height and signal are normal. Cord: Normal signal throughout. Posterior Fossa, vertebral arteries, paraspinal tissues: Negative. Disc levels: C2-3: Negative. C3-4: Minimal bulge without stenosis. C4-5: Mild disc bulge effaces the ventral thecal sac but the central canal and foramina are open. C5-6: Shallow broad-based disc bulge causes mild central canal narrowing. The foramina are widely patent. C6-7: Minimal disc bulge without stenosis. C7-T1: Negative. MRI THORACIC SPINE FINDINGS Alignment:  Normal. Vertebrae: Height and signal are normal. Cord:  Normal signal throughout. Paraspinal and other soft tissues: Negative. Disc levels: Disc height and hydration are maintained at all levels. There is no central canal or foraminal stenosis. IMPRESSION: Normal-appearing cervical and thoracic cord. No evidence of demyelinating disease. Mild cervical degenerative disc disease most notable at C5-6 where a shallow disc bulge causes mild central canal narrowing. Normal thoracic spine. Electronically Signed   By: Drusilla Kanner M.D.   On: 07/03/2017 16:39   Mr Thoracic Spine Wo Contrast  Result Date: 07/03/2017 CLINICAL DATA:  Intermittent migratory muscle spasms with numbness and tingling in all the extremities for 3 years. Intermittent vision loss. EXAM: MRI  CERVICAL AND THORACIC SPINE WITHOUT CONTRAST TECHNIQUE: Multiplanar and multiecho pulse sequences of the cervical spine, to include the craniocervical junction and cervicothoracic junction, and the thoracic spine, were obtained without intravenous contrast. COMPARISON:  None. FINDINGS: MRI CERVICAL SPINE FINDINGS Alignment: Maintained. The neck is in mild flexion or there is mild reversal of the normal cervical lordosis. Vertebrae: Height and signal are normal. Cord: Normal signal throughout. Posterior Fossa, vertebral arteries, paraspinal tissues: Negative. Disc levels: C2-3: Negative. C3-4: Minimal bulge without stenosis. C4-5: Mild disc bulge effaces the ventral thecal sac but the central canal and foramina are open. C5-6: Shallow broad-based disc bulge causes mild central canal narrowing. The foramina are widely patent. C6-7: Minimal disc bulge without stenosis. C7-T1: Negative. MRI THORACIC SPINE FINDINGS Alignment:  Normal. Vertebrae: Height and signal are normal. Cord:  Normal signal throughout. Paraspinal and other soft tissues: Negative. Disc levels: Disc height and hydration are maintained at all levels. There is no central canal or foraminal stenosis. IMPRESSION: Normal-appearing cervical and thoracic cord. No evidence of demyelinating disease. Mild cervical degenerative disc disease most notable at C5-6 where a shallow disc bulge causes mild central canal narrowing. Normal thoracic spine. Electronically Signed   By: Drusilla Kanner M.D.   On: 07/03/2017 16:39     IMPRESSION AND PLAN:   32 year old female with past medical history of migraines, neuropathy who presents to the hospital due to left lower extremity weakness, tingling and also intermittent blurred  vision and noted to have MRI findings consistent with multiple sclerosis.  1.  New onset multiple sclerosis-this is a cause of patient's intermittent numbness and tingling/blurry vision which she has had for a few weeks.  Patient's MRI of  the brain is consistent with demyelinating process suspected to be MS. - Patient will be admitted to the hospital and started on high-dose IV steroids.  I will get a neurology consult in the morning. - Further care as per neurology.  2.  Tobacco abuse-patient refused nicotine patch.  3.  History of migraines-patient has no acute symptoms presently.  All the records are reviewed and case discussed with ED provider. Management plans discussed with the patient, family and they are in agreement.  CODE STATUS: Full code  TOTAL TIME TAKING CARE OF THIS PATIENT: 40 minutes.    Houston Siren M.D on 07/03/2017 at 5:23 PM  Between 7am to 6pm - Pager - [phone]  After 6pm go to [email or web address] - password [email or web address] [facility]  [facility] [place]  Office  [phone]  CC: Primary care physician; Carly Milan, MD

## 2017-07-03 NOTE — ED Notes (Signed)
Pt in MRI at this time. Family member stepped out to lobby.

## 2017-07-03 NOTE — ED Notes (Signed)
Admitting at bedside 

## 2017-07-03 NOTE — ED Notes (Signed)
Attempted to call report. Unable to give report at this time. Was informed to call back in 5-10 minutes.

## 2017-07-03 NOTE — ED Notes (Signed)
Pt ambulatory to treatment room without difficulty noted.

## 2017-07-04 ENCOUNTER — Inpatient Hospital Stay: Payer: POS

## 2017-07-04 DIAGNOSIS — G35 Multiple sclerosis: Principal | ICD-10-CM

## 2017-07-04 LAB — URINALYSIS, ROUTINE W REFLEX MICROSCOPIC
BACTERIA UA: NONE SEEN
Bilirubin Urine: NEGATIVE
Glucose, UA: >=500 mg/dL — AB
HGB URINE DIPSTICK: NEGATIVE
Ketones, ur: NEGATIVE mg/dL
Leukocytes, UA: NEGATIVE
NITRITE: NEGATIVE
Protein, ur: NEGATIVE mg/dL
SPECIFIC GRAVITY, URINE: 1.013 (ref 1.005–1.030)
pH: 5 (ref 5.0–8.0)

## 2017-07-04 LAB — CBC
HCT: 38.2 % (ref 35.0–47.0)
HEMOGLOBIN: 12.4 g/dL (ref 12.0–16.0)
MCH: 29 pg (ref 26.0–34.0)
MCHC: 32.6 g/dL (ref 32.0–36.0)
MCV: 89.1 fL (ref 80.0–100.0)
Platelets: 286 10*3/uL (ref 150–440)
RBC: 4.29 MIL/uL (ref 3.80–5.20)
RDW: 14.3 % (ref 11.5–14.5)
WBC: 7.6 10*3/uL (ref 3.6–11.0)

## 2017-07-04 LAB — PROTIME-INR
INR: 1.1
PROTHROMBIN TIME: 14.1 s (ref 11.4–15.2)

## 2017-07-04 LAB — CSF CELL COUNT WITH DIFFERENTIAL
Eosinophils, CSF: 0 %
LYMPHS CSF: 95 %
MONOCYTE-MACROPHAGE-SPINAL FLUID: 5 %
Other Cells, CSF: 0
RBC COUNT CSF: 20 /mm3 — AB (ref 0–3)
Segmented Neutrophils-CSF: 0 %
TUBE #: 3
WBC, CSF: 2 /mm3 (ref 0–5)

## 2017-07-04 LAB — BASIC METABOLIC PANEL
ANION GAP: 7 (ref 5–15)
BUN: 14 mg/dL (ref 6–20)
CALCIUM: 9 mg/dL (ref 8.9–10.3)
CO2: 20 mmol/L — ABNORMAL LOW (ref 22–32)
Chloride: 107 mmol/L (ref 101–111)
Creatinine, Ser: 0.64 mg/dL (ref 0.44–1.00)
Glucose, Bld: 151 mg/dL — ABNORMAL HIGH (ref 65–99)
Potassium: 4 mmol/L (ref 3.5–5.1)
Sodium: 134 mmol/L — ABNORMAL LOW (ref 135–145)

## 2017-07-04 LAB — GLUCOSE, CSF: Glucose, CSF: 87 mg/dL — ABNORMAL HIGH (ref 40–70)

## 2017-07-04 LAB — PROTEIN, CSF: TOTAL PROTEIN, CSF: 18 mg/dL (ref 15–45)

## 2017-07-04 MED ORDER — LIDOCAINE HCL (PF) 1 % IJ SOLN
5.0000 mL | Freq: Once | INTRAMUSCULAR | Status: AC
  Administered 2017-07-04: 5 mL
  Filled 2017-07-04: qty 5

## 2017-07-04 MED ORDER — SODIUM CHLORIDE 4 MEQ/ML IV SOLN: Freq: Once | ORAL | Status: DC

## 2017-07-04 MED ORDER — LIDOCAINE HCL (PF) 1 % IJ SOLN
10.0000 mL | Freq: Once | INTRAMUSCULAR | Status: AC
  Administered 2017-07-04: 10 mL
  Filled 2017-07-04: qty 10

## 2017-07-04 MED ORDER — IBUPROFEN 400 MG PO TABS
400.0000 mg | ORAL_TABLET | ORAL | Status: AC
  Administered 2017-07-04: 400 mg via ORAL
  Filled 2017-07-04: qty 1

## 2017-07-04 NOTE — Progress Notes (Signed)
[facility] - [place] at [facility]   PATIENT NAME: Carly Martin    MR#:  [id number]  DATE OF BIRTH:  19-Jun-1985  SUBJECTIVE:  CHIEF COMPLAINT:   Chief Complaint  Patient presents with  . Numbness   -Several neurological complaints going on for 2 years.  Left leg tingling and numbness going on for almost 1 to 2 weeks. -Diagnosed with multiple sclerosis based on MRI.  Improving with IV steroids  REVIEW OF SYSTEMS:  Review of Systems  Constitutional: Negative for chills and fever.  HENT: Negative for hearing loss.   Eyes: Negative for blurred vision and double vision.  Respiratory: Negative for cough, shortness of breath and wheezing.   Cardiovascular: Negative for chest pain and palpitations.  Gastrointestinal: Negative for abdominal pain, constipation, diarrhea, nausea and vomiting.  Genitourinary: Negative for dysuria.  Musculoskeletal: Negative for myalgias.  Skin: Negative for rash.  Neurological: Positive for tingling, focal weakness and headaches. Negative for dizziness and seizures.    DRUG ALLERGIES:   Allergies  Allergen Reactions  . Codeine Shortness Of Breath    VITALS:  Blood pressure 110/65, pulse 72, temperature 98.3 F (36.8 C), temperature source Oral, resp. rate 18, height 5\' 5"  (1.651 m), weight 97.5 kg (215 lb), last menstrual period 06/17/2017, SpO2 100 %.  PHYSICAL EXAMINATION:  Physical Exam  GENERAL:  32 y.o.-year-old patient lying in the bed with no acute distress.  EYES: Pupils equal, round, reactive to light and accommodation. No scleral icterus. Extraocular muscles intact.  HEENT: Head atraumatic, normocephalic. Oropharynx and nasopharynx clear.  NECK:  Supple, no jugular venous distention. No thyroid enlargement, no tenderness.  LUNGS: Normal breath sounds bilaterally, no wheezing, rales,rhonchi or crepitation. No use of accessory muscles of respiration.  CARDIOVASCULAR: S1, S2 normal. No murmurs, rubs, or gallops.    ABDOMEN: Soft, nontender, nondistended. Bowel sounds present. No organomegaly or mass.  EXTREMITIES: No pedal edema, cyanosis, or clubbing.  NEUROLOGIC: Cranial nerves II through XII are intact. Muscle strength 5/5 in all extremities. Sensation intact. Gait not checked.  PSYCHIATRIC: The patient is alert and oriented x 3.  SKIN: No obvious rash, lesion, or ulcer.    LABORATORY PANEL:   CBC Recent Labs  Lab 07/04/17 0441  WBC 7.6  HGB 12.4  HCT 38.2  PLT 286   ------------------------------------------------------------------------------------------------------------------  Chemistries  Recent Labs  Lab 07/03/17 1228 07/04/17 0441  NA 136 134*  K 3.9 4.0  CL 105 107  CO2 21* 20*  GLUCOSE 103* 151*  BUN 15 14  CREATININE 0.68 0.64  CALCIUM 8.9 9.0  MG 1.8  --   AST 19  --   ALT 15  --   ALKPHOS 49  --   BILITOT 0.8  --    ------------------------------------------------------------------------------------------------------------------  Cardiac Enzymes No results for input(s): TROPONINI in the last 168 hours. ------------------------------------------------------------------------------------------------------------------  RADIOLOGY:  Mr Brain Wo Contrast  Result Date: 07/03/2017 CLINICAL DATA:  Three year history of intermittent numbness and tingling of the extremities. Visual disturbance. Pain in the left arm and leg. EXAM: MRI HEAD WITHOUT CONTRAST TECHNIQUE: Multiplanar, multiecho pulse sequences of the brain and surrounding structures were obtained without intravenous contrast. COMPARISON:  None. FINDINGS: Brain: Diffusion imaging does not show any acute or subacute infarctions. No other cause of restricted diffusion. The brainstem and cerebellum are normal. There are few scattered foci of T2 and FLAIR signal in the frontal deep and subcortical white matter, right more than left. There are also a few foci of  cortical and subcortical involvement in the parietal  regions left more than right. This could represent a manifestation of demyelinating disease. The differential diagnosis would be small-vessel ischemic changes and old shear injuries, but demyelinating disease is favored. Vascular: Major vessels at the base of the brain show flow. Skull and upper cervical spine: Negative Sinuses/Orbits: Clear/normal Other: None IMPRESSION: Abnormal examination showing scattered foci of abnormal T2 and FLAIR signal in the frontal deep and subcortical white matter and in the parietal cortical and subcortical brain. The most likely diagnosis is that of demyelinating disease/multiple sclerosis. The differential diagnosis does include ischemic changes and post traumatic changes. Electronically Signed   By: Paulina Fusi M.D.   On: 07/03/2017 15:17   Mr Cervical Spine Wo Contrast  Result Date: 07/03/2017 CLINICAL DATA:  Intermittent migratory muscle spasms with numbness and tingling in all the extremities for 3 years. Intermittent vision loss. EXAM: MRI CERVICAL AND THORACIC SPINE WITHOUT CONTRAST TECHNIQUE: Multiplanar and multiecho pulse sequences of the cervical spine, to include the craniocervical junction and cervicothoracic junction, and the thoracic spine, were obtained without intravenous contrast. COMPARISON:  None. FINDINGS: MRI CERVICAL SPINE FINDINGS Alignment: Maintained. The neck is in mild flexion or there is mild reversal of the normal cervical lordosis. Vertebrae: Height and signal are normal. Cord: Normal signal throughout. Posterior Fossa, vertebral arteries, paraspinal tissues: Negative. Disc levels: C2-3: Negative. C3-4: Minimal bulge without stenosis. C4-5: Mild disc bulge effaces the ventral thecal sac but the central canal and foramina are open. C5-6: Shallow broad-based disc bulge causes mild central canal narrowing. The foramina are widely patent. C6-7: Minimal disc bulge without stenosis. C7-T1: Negative. MRI THORACIC SPINE FINDINGS Alignment:  Normal.  Vertebrae: Height and signal are normal. Cord:  Normal signal throughout. Paraspinal and other soft tissues: Negative. Disc levels: Disc height and hydration are maintained at all levels. There is no central canal or foraminal stenosis. IMPRESSION: Normal-appearing cervical and thoracic cord. No evidence of demyelinating disease. Mild cervical degenerative disc disease most notable at C5-6 where a shallow disc bulge causes mild central canal narrowing. Normal thoracic spine. Electronically Signed   By: Drusilla Kanner M.D.   On: 07/03/2017 16:39   Mr Thoracic Spine Wo Contrast  Result Date: 07/03/2017 CLINICAL DATA:  Intermittent migratory muscle spasms with numbness and tingling in all the extremities for 3 years. Intermittent vision loss. EXAM: MRI CERVICAL AND THORACIC SPINE WITHOUT CONTRAST TECHNIQUE: Multiplanar and multiecho pulse sequences of the cervical spine, to include the craniocervical junction and cervicothoracic junction, and the thoracic spine, were obtained without intravenous contrast. COMPARISON:  None. FINDINGS: MRI CERVICAL SPINE FINDINGS Alignment: Maintained. The neck is in mild flexion or there is mild reversal of the normal cervical lordosis. Vertebrae: Height and signal are normal. Cord: Normal signal throughout. Posterior Fossa, vertebral arteries, paraspinal tissues: Negative. Disc levels: C2-3: Negative. C3-4: Minimal bulge without stenosis. C4-5: Mild disc bulge effaces the ventral thecal sac but the central canal and foramina are open. C5-6: Shallow broad-based disc bulge causes mild central canal narrowing. The foramina are widely patent. C6-7: Minimal disc bulge without stenosis. C7-T1: Negative. MRI THORACIC SPINE FINDINGS Alignment:  Normal. Vertebrae: Height and signal are normal. Cord:  Normal signal throughout. Paraspinal and other soft tissues: Negative. Disc levels: Disc height and hydration are maintained at all levels. There is no central canal or foraminal stenosis.  IMPRESSION: Normal-appearing cervical and thoracic cord. No evidence of demyelinating disease. Mild cervical degenerative disc disease most notable at C5-6 where a shallow disc bulge  causes mild central canal narrowing. Normal thoracic spine. Electronically Signed   By: Drusilla Kanner M.D.   On: 07/03/2017 16:39    EKG:  No orders found for this or any previous visit.  ASSESSMENT AND PLAN:   32 year old female with past medical history significant for migraine headaches presents to hospital secondary to worsening left leg tingling and numbness  1.  Multiple sclerosis-MRI of the brain and C-spine done in the emergency room consistent with demyelinating process.  Given her neurological complaints, new diagnosis of multiple sclerosis. -Neurology consulted -Continue high-dose steroids for at least 3 days -Outpatient neurology follow-up after discharge  2.  History of migraines.  Tension headache at this time.  Will give 1 dose of Motrin  3.  Tobacco abuse-was counseled on admission  4.  DVT prophylaxis-Lovenox   All the records are reviewed and case discussed with Care Management/Social Workerr. Management plans discussed with the patient, family and they are in agreement.  CODE STATUS: Full Code  TOTAL TIME TAKING CARE OF THIS PATIENT: 38 minutes.   POSSIBLE D/C IN 1-2 DAYS, DEPENDING ON CLINICAL CONDITION.   Enid Baas M.D on 07/04/2017 at 10:51 AM  Between 7am to 6pm - Pager - [phone]  After 6pm go to [email or web address] - Social research officer, government  [facility]  Office  [phone]  CC: Primary care physician; Reubin Milan, MD

## 2017-07-04 NOTE — Procedures (Signed)
Failed attempt at LP on floor by Dr. Thad Ranger.  Pt had not received blood thinner.  MR reviewed.  Labs checked.  Procedure and risks reviewed with pt.  Signed consent obtained after questions answered.  Prepped and draped in sterile fashion.  Under fluoroscopic guidance  L3-4 LP performed (inferior to prior attempt) with single pass of 22 g spinal needle.  Minimally blood tinged at hub.  8 cc of clear appearing CSF collected, labeled and sent for labs as per request.   No immediate complications.  Reviewed post procedure instructions.

## 2017-07-04 NOTE — Consult Note (Signed)
Reason for Consult:MS Referring Physician: Jennye Boroughs  CC: Left sided numbness  HPI: Carly Martin is an 32 y.o. female with a known history of migraines, neuropathy who presents to the hospital due to left lower extremity weakness, numbness persistent over the past few days.  Patient says that she has had intermittent neurological symptoms with some lower extremity numbness tingling in the past and has seen neurology with Dr. Malvin Johns and was thought to have migraines and also peripheral neuropathy.  She was given gabapentin, Elavil in the past but has stopped taking those medications and has not seen a neurologist in over 2 years.  She says over the past few days she has had significant left lower extremity weakness numbness and tingling.  She says her legs feel like pins-and-needles and she is having a hard time lifting her left leg and using it to ambulate well.  She also developed some left upper extremity and left-sided facial tingling and numbness too.  Patient is also had some blurry vision over the past few years.  Her last eye exam was over a year ago and it was essentially benign but she was prescribed glasses.  Also reports difficulty concentrating at times and fatigue.     Past Medical History:  Diagnosis Date  . Asthma     Past Surgical History:  Procedure Laterality Date  . CERVICAL BIOPSY  W/ LOOP ELECTRODE EXCISION  2008  . TONSILLECTOMY      Family History  Problem Relation Age of Onset  . Migraines Sister     Social History:  reports that she has been smoking cigarettes.  She has a 2.50 pack-year smoking history. She has never used smokeless tobacco. She reports that she drinks alcohol. She reports that she does not use drugs.  Allergies  Allergen Reactions  . Codeine Shortness Of Breath    Medications:  I have reviewed the patient's current medications. Prior to Admission:  Medications Prior to Admission  Medication Sig Dispense Refill Last Dose  . albuterol  (PROVENTIL HFA;VENTOLIN HFA) 108 (90 Base) MCG/ACT inhaler Inhale 1-2 puffs into the lungs every 6 (six) hours as needed for wheezing or shortness of breath. 1 Inhaler 0 PRN at PRN  . etonogestrel (IMPLANON) 68 MG IMPL implant 1 each by Subdermal route once.   Taking  . fluticasone (FLONASE) 50 MCG/ACT nasal spray Place 2 sprays into both nostrils daily. (Patient taking differently: Place 2 sprays into both nostrils daily as needed for allergies. ) 16 g 0 PRN at PRN   Scheduled: . enoxaparin (LOVENOX) injection  40 mg Subcutaneous Q24H    ROS: History obtained from the patient  General ROS: fatigue Psychological ROS: negative for - behavioral disorder, hallucinations, memory difficulties, mood swings or suicidal ideation Ophthalmic ROS: blurry vision ENT ROS: negative for - epistaxis, nasal discharge, oral lesions, sore throat, tinnitus or vertigo Allergy and Immunology ROS: negative for - hives or itchy/watery eyes Hematological and Lymphatic ROS: negative for - bleeding problems, bruising or swollen lymph nodes Endocrine ROS: negative for - galactorrhea, hair pattern changes, polydipsia/polyuria or temperature intolerance Respiratory ROS: negative for - cough, hemoptysis, shortness of breath or wheezing Cardiovascular ROS: negative for - chest pain, dyspnea on exertion, edema or irregular heartbeat Gastrointestinal ROS: negative for - abdominal pain, diarrhea, hematemesis, nausea/vomiting or stool incontinence Genito-Urinary ROS: negative for - dysuria, hematuria, incontinence or urinary frequency/urgency Musculoskeletal ROS: negative for - joint swelling or muscular weakness Neurological ROS: as noted in HPI Dermatological ROS: negative for rash  and skin lesion changes  Physical Examination: Blood pressure 110/65, pulse 72, temperature 98.3 F (36.8 C), temperature source Oral, resp. rate 18, height 5\' 5"  (1.651 m), weight 97.5 kg (215 lb), last menstrual period 06/17/2017, SpO2 100  %.  HEENT-  Normocephalic, no lesions, without obvious abnormality.  Normal external eye and conjunctiva.  Normal TM's bilaterally.  Normal auditory canals and external ears. Normal external nose, mucus membranes and septum.  Normal pharynx. Cardiovascular- S1, S2 normal, pulses palpable throughout   Lungs- chest clear, no wheezing, rales, normal symmetric air entry Abdomen- soft, non-tender; bowel sounds normal; no masses,  no organomegaly Extremities- no edema Lymph-no adenopathy palpable Musculoskeletal-no joint tenderness, deformity or swelling Skin-warm and dry, no hyperpigmentation, vitiligo, or suspicious lesions  Neurological Examination   Mental Status: Alert, oriented, thought content appropriate.  Speech fluent without evidence of aphasia.  Able to follow 3 step commands without difficulty. Cranial Nerves: II: Discs flat bilaterally; Visual fields grossly normal, pupils equal, round, reactive to light and accommodation III,IV, VI: ptosis not present, extra-ocular motions intact bilaterally V,VII: smile symmetric, facial light touch sensation normal bilaterally VIII: hearing normal bilaterally IX,X: gag reflex present XI: bilateral shoulder shrug XII: midline tongue extension Motor: Right : Upper extremity   5/5    Left:     Upper extremity   5/5  Lower extremity   5/5     Lower extremity   5/5 Tone and bulk:normal tone throughout; no atrophy noted Sensory: Pinprick and light touch intact throughout, bilaterally Deep Tendon Reflexes: 2+ and symmetric throughout Plantars: Right: downgoing   Left: downgoing Cerebellar: normal finger-to-nose, normal rapid alternating movements and normal heel-to-shin test Gait: not tested due to safety concerns   Laboratory Studies:   Basic Metabolic Panel: Recent Labs  Lab 07/03/17 1228 07/04/17 0441  NA 136 134*  K 3.9 4.0  CL 105 107  CO2 21* 20*  GLUCOSE 103* 151*  BUN 15 14  CREATININE 0.68 0.64  CALCIUM 8.9 9.0  MG 1.8   --     Liver Function Tests: Recent Labs  Lab 07/03/17 1228  AST 19  ALT 15  ALKPHOS 49  BILITOT 0.8  PROT 7.7  ALBUMIN 4.1   No results for input(s): LIPASE, AMYLASE in the last 168 hours. No results for input(s): AMMONIA in the last 168 hours.  CBC: Recent Labs  Lab 07/03/17 1228 07/04/17 0441  WBC 7.4 7.6  NEUTROABS 4.4  --   HGB 12.3 12.4  HCT 37.3 38.2  MCV 88.8 89.1  PLT 290 286    Cardiac Enzymes: No results for input(s): CKTOTAL, CKMB, CKMBINDEX, TROPONINI in the last 168 hours.  BNP: Invalid input(s): POCBNP  CBG: No results for input(s): GLUCAP in the last 168 hours.  Microbiology: No results found for this or any previous visit.  Coagulation Studies: Recent Labs    07/04/17 1345  LABPROT 14.1  INR 1.10    Urinalysis: No results for input(s): COLORURINE, LABSPEC, PHURINE, GLUCOSEU, HGBUR, BILIRUBINUR, KETONESUR, PROTEINUR, UROBILINOGEN, NITRITE, LEUKOCYTESUR in the last 168 hours.  Invalid input(s): APPERANCEUR  Lipid Panel:     Component Value Date/Time   CHOL 165 03/18/2015 0802   TRIG 46 03/18/2015 0802   HDL 36 (L) 03/18/2015 0802   CHOLHDL 4.6 (H) 03/18/2015 0802   LDLCALC 120 (H) 03/18/2015 0802    HgbA1C:  Lab Results  Component Value Date   HGBA1C 5.6 03/18/2015    Urine Drug Screen:  No results found for: LABOPIA, COCAINSCRNUR, LABBENZ,  AMPHETMU, THCU, LABBARB  Alcohol Level: No results for input(s): ETH in the last 168 hours.   Imaging: Mr Brain Wo Contrast  Result Date: 07/03/2017 CLINICAL DATA:  Three year history of intermittent numbness and tingling of the extremities. Visual disturbance. Pain in the left arm and leg. EXAM: MRI HEAD WITHOUT CONTRAST TECHNIQUE: Multiplanar, multiecho pulse sequences of the brain and surrounding structures were obtained without intravenous contrast. COMPARISON:  None. FINDINGS: Brain: Diffusion imaging does not show any acute or subacute infarctions. No other cause of restricted  diffusion. The brainstem and cerebellum are normal. There are few scattered foci of T2 and FLAIR signal in the frontal deep and subcortical white matter, right more than left. There are also a few foci of cortical and subcortical involvement in the parietal regions left more than right. This could represent a manifestation of demyelinating disease. The differential diagnosis would be small-vessel ischemic changes and old shear injuries, but demyelinating disease is favored. Vascular: Major vessels at the base of the brain show flow. Skull and upper cervical spine: Negative Sinuses/Orbits: Clear/normal Other: None IMPRESSION: Abnormal examination showing scattered foci of abnormal T2 and FLAIR signal in the frontal deep and subcortical white matter and in the parietal cortical and subcortical brain. The most likely diagnosis is that of demyelinating disease/multiple sclerosis. The differential diagnosis does include ischemic changes and post traumatic changes. Electronically Signed   By: Paulina Fusi M.D.   On: 07/03/2017 15:17   Mr Cervical Spine Wo Contrast  Result Date: 07/03/2017 CLINICAL DATA:  Intermittent migratory muscle spasms with numbness and tingling in all the extremities for 3 years. Intermittent vision loss. EXAM: MRI CERVICAL AND THORACIC SPINE WITHOUT CONTRAST TECHNIQUE: Multiplanar and multiecho pulse sequences of the cervical spine, to include the craniocervical junction and cervicothoracic junction, and the thoracic spine, were obtained without intravenous contrast. COMPARISON:  None. FINDINGS: MRI CERVICAL SPINE FINDINGS Alignment: Maintained. The neck is in mild flexion or there is mild reversal of the normal cervical lordosis. Vertebrae: Height and signal are normal. Cord: Normal signal throughout. Posterior Fossa, vertebral arteries, paraspinal tissues: Negative. Disc levels: C2-3: Negative. C3-4: Minimal bulge without stenosis. C4-5: Mild disc bulge effaces the ventral thecal sac but the  central canal and foramina are open. C5-6: Shallow broad-based disc bulge causes mild central canal narrowing. The foramina are widely patent. C6-7: Minimal disc bulge without stenosis. C7-T1: Negative. MRI THORACIC SPINE FINDINGS Alignment:  Normal. Vertebrae: Height and signal are normal. Cord:  Normal signal throughout. Paraspinal and other soft tissues: Negative. Disc levels: Disc height and hydration are maintained at all levels. There is no central canal or foraminal stenosis. IMPRESSION: Normal-appearing cervical and thoracic cord. No evidence of demyelinating disease. Mild cervical degenerative disc disease most notable at C5-6 where a shallow disc bulge causes mild central canal narrowing. Normal thoracic spine. Electronically Signed   By: Drusilla Kanner M.D.   On: 07/03/2017 16:39   Mr Thoracic Spine Wo Contrast  Result Date: 07/03/2017 CLINICAL DATA:  Intermittent migratory muscle spasms with numbness and tingling in all the extremities for 3 years. Intermittent vision loss. EXAM: MRI CERVICAL AND THORACIC SPINE WITHOUT CONTRAST TECHNIQUE: Multiplanar and multiecho pulse sequences of the cervical spine, to include the craniocervical junction and cervicothoracic junction, and the thoracic spine, were obtained without intravenous contrast. COMPARISON:  None. FINDINGS: MRI CERVICAL SPINE FINDINGS Alignment: Maintained. The neck is in mild flexion or there is mild reversal of the normal cervical lordosis. Vertebrae: Height and signal are normal. Cord: Normal  signal throughout. Posterior Fossa, vertebral arteries, paraspinal tissues: Negative. Disc levels: C2-3: Negative. C3-4: Minimal bulge without stenosis. C4-5: Mild disc bulge effaces the ventral thecal sac but the central canal and foramina are open. C5-6: Shallow broad-based disc bulge causes mild central canal narrowing. The foramina are widely patent. C6-7: Minimal disc bulge without stenosis. C7-T1: Negative. MRI THORACIC SPINE FINDINGS  Alignment:  Normal. Vertebrae: Height and signal are normal. Cord:  Normal signal throughout. Paraspinal and other soft tissues: Negative. Disc levels: Disc height and hydration are maintained at all levels. There is no central canal or foraminal stenosis. IMPRESSION: Normal-appearing cervical and thoracic cord. No evidence of demyelinating disease. Mild cervical degenerative disc disease most notable at C5-6 where a shallow disc bulge causes mild central canal narrowing. Normal thoracic spine. Electronically Signed   By: Drusilla Kanner M.D.   On: 07/03/2017 16:39   Dg Fluoro Guided Loc Of Needle/cath Tip For Spinal Inject Lt  Result Date: 07/04/2017 CLINICAL DATA:  32 year old female with abnormal MR. Tingling. Question multiple sclerosis. Subsequent encounter. EXAM: DIAGNOSTIC LUMBAR PUNCTURE UNDER FLUOROSCOPIC GUIDANCE FLUOROSCOPY TIME:  Fluoroscopy Time:  24 seconds. Radiation Exposure Index: 3.4 mGy. PROCEDURE: Failed attempt on floor. Request for fluoroscopic guided LP. Informed consent was obtained from the patient prior to the procedure, including potential complications of headache, bleeding and infection. With the patient prone, the lower back was prepped with Betadine. 1% Lidocaine was used for local anesthesia. Lumbar puncture was performed at the L3-4 level (inferior to prior attempted lumbar puncture) with a single pass of a 22 gauge needle with return of minimally blood tinged CSF at the hub. 8 cc of clear appearing CSF were obtained for laboratory studies. The patient tolerated the procedure well and there were no apparent complications. Postprocedure instructions reviewed with the patient. IMPRESSION: Successful L3-4 lumbar puncture. Electronically Signed   By: Lacy Duverney M.D.   On: 07/04/2017 16:46     Assessment/Plan: 32 year old female presenting with complains of paresthesias and LLE weakness.  MRI of the brain performed and suggestive of MS.  MRI of the cervical, thoracic and  lumbar spines are unremarkable.  Patient started on Solumedrol.  Questions form husband and patient addressed.    Recommendations: 1. Continue Solumedrol 1g X 3 days.  Presenting symptoms have resolved. 2. LP for addtional studies to confirm diagnosis of MS.  CSF to be sent for oligoclonal bands, IgG index and MBP, along with routine studies 3. Patient to follow up with Dr. Malvin Johns as an outpatient for decisions to be made concerning DMT.  Patient may also benefit from a VEP as an outpatient as well.   4. PT consult  Thana Farr, MD Neurology [phone] 07/04/2017, 5:50 PM   Addendum: LP attempt made  But unsuccessful.  Patient to have LP under fluoro.  Dr. Nemiah Commander made aware.    Thana Farr, MD Neurology [phone]

## 2017-07-05 LAB — HIV ANTIBODY (ROUTINE TESTING W REFLEX): HIV SCREEN 4TH GENERATION: NONREACTIVE

## 2017-07-05 MED ORDER — NICOTINE 14 MG/24HR TD PT24: 14.0000 mg | MEDICATED_PATCH | TRANSDERMAL | 0 refills | Status: AC

## 2017-07-05 NOTE — Progress Notes (Signed)
Discussed discharge instructions and medications with patient. IV removed. All questions addressed. Patient transported home via car by her boyfriend.  Danielle Voncile Schwarz, RN 

## 2017-07-05 NOTE — Discharge Summary (Signed)
[facility] [place] [facility]   PATIENT NAME: Carly Martin    MR#:  [id number]  DATE OF BIRTH:  1985-10-20  DATE OF ADMISSION:  07/03/2017   ADMITTING PHYSICIAN: Houston Siren, MD  DATE OF DISCHARGE:  07/05/17  PRIMARY CARE PHYSICIAN: Reubin Milan, MD   ADMISSION DIAGNOSIS:   Multiple sclerosis (HCC) [G35] Paresthesias [R20.2] Weakness [R53.1]  DISCHARGE DIAGNOSIS:   Active Problems:   Multiple sclerosis (HCC)   SECONDARY DIAGNOSIS:   Past Medical History:  Diagnosis Date  . Asthma     HOSPITAL COURSE:   32 year old female with past medical history significant for migraine headaches presents to hospital secondary to worsening left leg tingling and numbness  1.  Multiple sclerosis-MRI of the brain and C-spine done in the emergency room consistent with demyelinating process.  Given her neurological complaints, new diagnosis of multiple sclerosis. -Neurology consulted - s/p lumbar puncture -received high-dose steroids for  3 days- resolved symptoms -Outpatient neurology follow-up after discharge  2.  History of migraines.  Tension headache on admission- much improved  3.  Tobacco abuse-was counseled, discharging on a nicotine patch  Will be discharged today  DISCHARGE CONDITIONS:   Guarded  CONSULTS OBTAINED:   Treatment Team:  Thana Farr, MD  DRUG ALLERGIES:   Allergies  Allergen Reactions  . Codeine Shortness Of Breath   DISCHARGE MEDICATIONS:   Allergies as of 07/05/2017      Reactions   Codeine Shortness Of Breath      Medication List    TAKE these medications   albuterol 108 (90 Base) MCG/ACT inhaler Commonly known as:  PROVENTIL HFA;VENTOLIN HFA Inhale 1-2 puffs into the lungs every 6 (six) hours as needed for wheezing or shortness of breath.   fluticasone 50 MCG/ACT nasal spray Commonly known as:  FLONASE Place 2 sprays into both nostrils daily. What changed:    when to take this  reasons  to take this   IMPLANON 68 MG Impl implant Generic drug:  etonogestrel 1 each by Subdermal route once.   nicotine 14 mg/24hr patch Commonly known as:  NICODERM CQ - dosed in mg/24 hours Place 1 patch (14 mg total) onto the skin daily.        DISCHARGE INSTRUCTIONS:   1. Neurology f/u in 1-2 weeks  DIET:   Regular diet  ACTIVITY:   Activity as tolerated  OXYGEN:   Home Oxygen: No.  Oxygen Delivery: room air  DISCHARGE LOCATION:   home   If you experience worsening of your admission symptoms, develop shortness of breath, life threatening emergency, suicidal or homicidal thoughts you must seek medical attention immediately by calling 911 or calling your MD immediately  if symptoms less severe.  You Must read complete instructions/literature along with all the possible adverse reactions/side effects for all the Medicines you take and that have been prescribed to you. Take any new Medicines after you have completely understood and accpet all the possible adverse reactions/side effects.   Please note  You were cared for by a hospitalist during your hospital stay. If you have any questions about your discharge medications or the care you received while you were in the hospital after you are discharged, you can call the unit and asked to speak with the hospitalist on call if the hospitalist that took care of you is not available. Once you are discharged, your primary care physician will handle any further medical issues. Please note that NO REFILLS for any discharge  medications will be authorized once you are discharged, as it is imperative that you return to your primary care physician (or establish a relationship with a primary care physician if you do not have one) for your aftercare needs so that they can reassess your need for medications and monitor your lab values.    On the day of Discharge:  VITAL SIGNS:   Blood pressure (!) 123/54, pulse 70, temperature 98.4 F (36.9  C), temperature source Oral, resp. rate 18, height 5\' 5"  (1.651 m), weight 97.5 kg (215 lb), last menstrual period 06/17/2017, SpO2 99 %.  PHYSICAL EXAMINATION:    GENERAL:  32 y.o.-year-old patient lying in the bed with no acute distress.  EYES: Pupils equal, round, reactive to light and accommodation. No scleral icterus. Extraocular muscles intact.  HEENT: Head atraumatic, normocephalic. Oropharynx and nasopharynx clear.  NECK:  Supple, no jugular venous distention. No thyroid enlargement, no tenderness.  LUNGS: Normal breath sounds bilaterally, no wheezing, rales,rhonchi or crepitation. No use of accessory muscles of respiration.  CARDIOVASCULAR: S1, S2 normal. No murmurs, rubs, or gallops.  ABDOMEN: Soft, non-tender, non-distended. Bowel sounds present. No organomegaly or mass.  EXTREMITIES: No pedal edema, cyanosis, or clubbing.  NEUROLOGIC: Cranial nerves II through XII are intact. Muscle strength 5/5 in all extremities. Sensation intact. Gait not checked.  PSYCHIATRIC: The patient is alert and oriented x 3.  SKIN: No obvious rash, lesion, or ulcer.   DATA REVIEW:   CBC Recent Labs  Lab 07/04/17 0441  WBC 7.6  HGB 12.4  HCT 38.2  PLT 286    Chemistries  Recent Labs  Lab 07/03/17 1228 07/04/17 0441  NA 136 134*  K 3.9 4.0  CL 105 107  CO2 21* 20*  GLUCOSE 103* 151*  BUN 15 14  CREATININE 0.68 0.64  CALCIUM 8.9 9.0  MG 1.8  --   AST 19  --   ALT 15  --   ALKPHOS 49  --   BILITOT 0.8  --      Microbiology Results  Results for orders placed or performed during the hospital encounter of 07/03/17  CSF culture     Status: None (Preliminary result)   Collection Time: 07/04/17  4:01 PM  Result Value Ref Range Status   Specimen Description CSF  Final   Special Requests NONE  Final   Gram Stain NO ORGANISMS SEEN FEW WBC SEEN   Final   Culture   Final    NO ORGANISMS SEEN Performed at [facility], [address] [place], [place] [address]     Report Status PENDING  Incomplete    RADIOLOGY:  Dg Fluoro Guided Loc Of Needle/cath Tip For Spinal Inject Lt  Result Date: 07/04/2017 CLINICAL DATA:  32 year old female with abnormal MR. Tingling. Question multiple sclerosis. Subsequent encounter. EXAM: DIAGNOSTIC LUMBAR PUNCTURE UNDER FLUOROSCOPIC GUIDANCE FLUOROSCOPY TIME:  Fluoroscopy Time:  24 seconds. Radiation Exposure Index: 3.4 mGy. PROCEDURE: Failed attempt on floor. Request for fluoroscopic guided LP. Informed consent was obtained from the patient prior to the procedure, including potential complications of headache, bleeding and infection. With the patient prone, the lower back was prepped with Betadine. 1% Lidocaine was used for local anesthesia. Lumbar puncture was performed at the L3-4 level (inferior to prior attempted lumbar puncture) with a single pass of a 22 gauge needle with return of minimally blood tinged CSF at the hub. 8 cc of clear appearing CSF were obtained for laboratory studies. The patient tolerated the procedure well and  there were no apparent complications. Postprocedure instructions reviewed with the patient. IMPRESSION: Successful L3-4 lumbar puncture. Electronically Signed   By: Lacy Duverney M.D.   On: 07/04/2017 16:46     Management plans discussed with the patient, family and they are in agreement.  CODE STATUS:     Code Status Orders  (From admission, onward)        Start     Ordered   07/03/17 1832  Full code  Continuous     07/03/17 1831    Code Status History    This patient has a current code status but no historical code status.      TOTAL TIME TAKING CARE OF THIS PATIENT: 38 minutes.    Enid Baas M.D on 07/05/2017 at 12:01 PM  Between 7am to 6pm - Pager - [phone]  After 6pm go to [email or web address] - Social research officer, government  [facility]  Office  [phone]  CC: Primary care physician; Reubin Milan, MD   Note: This dictation was prepared with  Dragon dictation along with smaller phrase technology. Any transcriptional errors that result from this process are unintentional.

## 2017-07-05 NOTE — Progress Notes (Signed)
Subjective: No new complaints.  Tolerating Solumedrol  Objective: Current vital signs: BP (!) 123/54 (BP Location: Left Arm)   Pulse 70   Temp 98.4 F (36.9 C) (Oral)   Resp 18   Ht 5\' 5"  (1.651 m)   Wt 97.5 kg (215 lb)   LMP 06/17/2017 (Exact Date)   SpO2 99%   BMI 35.78 kg/m  Vital signs in last 24 hours: Temp:  [97.8 F (36.6 C)-98.4 F (36.9 C)] 98.4 F (36.9 C) (06/04 0523) Pulse Rate:  [70-81] 70 (06/04 0523) Resp:  [18] 18 (06/04 0523) BP: (119-123)/(54-65) 123/54 (06/04 0523) SpO2:  [99 %-100 %] 99 % (06/04 0523)  Intake/Output from previous day: 06/03 0701 - 06/04 0700 In: 120 [P.O.:120] Out: -  Intake/Output this shift: No intake/output data recorded. Nutritional status:  Diet Order           Diet regular Room service appropriate? Yes; Fluid consistency: Thin  Diet effective now          Neurologic Exam: Mental Status: Alert, oriented, thought content appropriate.  Speech fluent without evidence of aphasia.  Able to follow 3 step commands without difficulty. Cranial Nerves: II: Discs flat bilaterally; Visual fields grossly normal, pupils equal, round, reactive to light and accommodation III,IV, VI: ptosis not present, extra-ocular motions intact bilaterally V,VII: smile symmetric, facial light touch sensation normal bilaterally VIII: hearing normal bilaterally IX,X: gag reflex present XI: bilateral shoulder shrug XII: midline tongue extension Motor: 5/5 throughout Sensory: Pinprick and light touch intact throughout, bilaterally   Lab Results: Basic Metabolic Panel: Recent Labs  Lab 07/03/17 1228 07/04/17 0441  NA 136 134*  K 3.9 4.0  CL 105 107  CO2 21* 20*  GLUCOSE 103* 151*  BUN 15 14  CREATININE 0.68 0.64  CALCIUM 8.9 9.0  MG 1.8  --     Liver Function Tests: Recent Labs  Lab 07/03/17 1228  AST 19  ALT 15  ALKPHOS 49  BILITOT 0.8  PROT 7.7  ALBUMIN 4.1   No results for input(s): LIPASE, AMYLASE in the last 168 hours. No  results for input(s): AMMONIA in the last 168 hours.  CBC: Recent Labs  Lab 07/03/17 1228 07/04/17 0441  WBC 7.4 7.6  NEUTROABS 4.4  --   HGB 12.3 12.4  HCT 37.3 38.2  MCV 88.8 89.1  PLT 290 286    Cardiac Enzymes: No results for input(s): CKTOTAL, CKMB, CKMBINDEX, TROPONINI in the last 168 hours.  Lipid Panel: No results for input(s): CHOL, TRIG, HDL, CHOLHDL, VLDL, LDLCALC in the last 168 hours.  CBG: No results for input(s): GLUCAP in the last 168 hours.  Microbiology: Results for orders placed or performed during the hospital encounter of 07/03/17  CSF culture     Status: None (Preliminary result)   Collection Time: 07/04/17  4:01 PM  Result Value Ref Range Status   Specimen Description CSF  Final   Special Requests NONE  Final   Gram Stain NO ORGANISMS SEEN FEW WBC SEEN   Final   Culture   Final    NO ORGANISMS SEEN Performed at [facility], [address] [place], [place] [address]    Report Status PENDING  Incomplete    Coagulation Studies: Recent Labs    07/04/17 1345  LABPROT 14.1  INR 1.10    Imaging: Mr Brain Wo Contrast  Result Date: 07/03/2017 CLINICAL DATA:  Three year history of intermittent numbness and tingling of the extremities. Visual disturbance. Pain in the left  arm and leg. EXAM: MRI HEAD WITHOUT CONTRAST TECHNIQUE: Multiplanar, multiecho pulse sequences of the brain and surrounding structures were obtained without intravenous contrast. COMPARISON:  None. FINDINGS: Brain: Diffusion imaging does not show any acute or subacute infarctions. No other cause of restricted diffusion. The brainstem and cerebellum are normal. There are few scattered foci of T2 and FLAIR signal in the frontal deep and subcortical white matter, right more than left. There are also a few foci of cortical and subcortical involvement in the parietal regions left more than right. This could represent a manifestation of demyelinating disease. The differential  diagnosis would be small-vessel ischemic changes and old shear injuries, but demyelinating disease is favored. Vascular: Major vessels at the base of the brain show flow. Skull and upper cervical spine: Negative Sinuses/Orbits: Clear/normal Other: None IMPRESSION: Abnormal examination showing scattered foci of abnormal T2 and FLAIR signal in the frontal deep and subcortical white matter and in the parietal cortical and subcortical brain. The most likely diagnosis is that of demyelinating disease/multiple sclerosis. The differential diagnosis does include ischemic changes and post traumatic changes. Electronically Signed   By: Paulina Fusi M.D.   On: 07/03/2017 15:17   Mr Cervical Spine Wo Contrast  Result Date: 07/03/2017 CLINICAL DATA:  Intermittent migratory muscle spasms with numbness and tingling in all the extremities for 3 years. Intermittent vision loss. EXAM: MRI CERVICAL AND THORACIC SPINE WITHOUT CONTRAST TECHNIQUE: Multiplanar and multiecho pulse sequences of the cervical spine, to include the craniocervical junction and cervicothoracic junction, and the thoracic spine, were obtained without intravenous contrast. COMPARISON:  None. FINDINGS: MRI CERVICAL SPINE FINDINGS Alignment: Maintained. The neck is in mild flexion or there is mild reversal of the normal cervical lordosis. Vertebrae: Height and signal are normal. Cord: Normal signal throughout. Posterior Fossa, vertebral arteries, paraspinal tissues: Negative. Disc levels: C2-3: Negative. C3-4: Minimal bulge without stenosis. C4-5: Mild disc bulge effaces the ventral thecal sac but the central canal and foramina are open. C5-6: Shallow broad-based disc bulge causes mild central canal narrowing. The foramina are widely patent. C6-7: Minimal disc bulge without stenosis. C7-T1: Negative. MRI THORACIC SPINE FINDINGS Alignment:  Normal. Vertebrae: Height and signal are normal. Cord:  Normal signal throughout. Paraspinal and other soft tissues: Negative.  Disc levels: Disc height and hydration are maintained at all levels. There is no central canal or foraminal stenosis. IMPRESSION: Normal-appearing cervical and thoracic cord. No evidence of demyelinating disease. Mild cervical degenerative disc disease most notable at C5-6 where a shallow disc bulge causes mild central canal narrowing. Normal thoracic spine. Electronically Signed   By: Drusilla Kanner M.D.   On: 07/03/2017 16:39   Mr Thoracic Spine Wo Contrast  Result Date: 07/03/2017 CLINICAL DATA:  Intermittent migratory muscle spasms with numbness and tingling in all the extremities for 3 years. Intermittent vision loss. EXAM: MRI CERVICAL AND THORACIC SPINE WITHOUT CONTRAST TECHNIQUE: Multiplanar and multiecho pulse sequences of the cervical spine, to include the craniocervical junction and cervicothoracic junction, and the thoracic spine, were obtained without intravenous contrast. COMPARISON:  None. FINDINGS: MRI CERVICAL SPINE FINDINGS Alignment: Maintained. The neck is in mild flexion or there is mild reversal of the normal cervical lordosis. Vertebrae: Height and signal are normal. Cord: Normal signal throughout. Posterior Fossa, vertebral arteries, paraspinal tissues: Negative. Disc levels: C2-3: Negative. C3-4: Minimal bulge without stenosis. C4-5: Mild disc bulge effaces the ventral thecal sac but the central canal and foramina are open. C5-6: Shallow broad-based disc bulge causes mild central canal narrowing. The foramina  are widely patent. C6-7: Minimal disc bulge without stenosis. C7-T1: Negative. MRI THORACIC SPINE FINDINGS Alignment:  Normal. Vertebrae: Height and signal are normal. Cord:  Normal signal throughout. Paraspinal and other soft tissues: Negative. Disc levels: Disc height and hydration are maintained at all levels. There is no central canal or foraminal stenosis. IMPRESSION: Normal-appearing cervical and thoracic cord. No evidence of demyelinating disease. Mild cervical degenerative  disc disease most notable at C5-6 where a shallow disc bulge causes mild central canal narrowing. Normal thoracic spine. Electronically Signed   By: Drusilla Kanner M.D.   On: 07/03/2017 16:39   Dg Fluoro Guided Loc Of Needle/cath Tip For Spinal Inject Lt  Result Date: 07/04/2017 CLINICAL DATA:  32 year old female with abnormal MR. Tingling. Question multiple sclerosis. Subsequent encounter. EXAM: DIAGNOSTIC LUMBAR PUNCTURE UNDER FLUOROSCOPIC GUIDANCE FLUOROSCOPY TIME:  Fluoroscopy Time:  24 seconds. Radiation Exposure Index: 3.4 mGy. PROCEDURE: Failed attempt on floor. Request for fluoroscopic guided LP. Informed consent was obtained from the patient prior to the procedure, including potential complications of headache, bleeding and infection. With the patient prone, the lower back was prepped with Betadine. 1% Lidocaine was used for local anesthesia. Lumbar puncture was performed at the L3-4 level (inferior to prior attempted lumbar puncture) with a single pass of a 22 gauge needle with return of minimally blood tinged CSF at the hub. 8 cc of clear appearing CSF were obtained for laboratory studies. The patient tolerated the procedure well and there were no apparent complications. Postprocedure instructions reviewed with the patient. IMPRESSION: Successful L3-4 lumbar puncture. Electronically Signed   By: Lacy Duverney M.D.   On: 07/04/2017 16:46    Medications:  I have reviewed the patient's current medications. Scheduled: . enoxaparin (LOVENOX) injection  40 mg Subcutaneous Q24H    Assessment/Plan: No new complaints.  Tolerating Solumedrol.  Today is day 3 of 3 treatments.  LP successfully performed on yesterday.  Cell count, protein and glucose are unremarkable.  Remaining studies pending.    Recommendations: 1. Patient to follow up with Dr. Malvin Johns at discharge.  LP results to be followed up at that time.  2. Patient to be evaluated by ENT/audiology and Ophthalmology on an outpatient basis.       LOS: 2 days   Thana Farr, MD Neurology [phone] 07/05/2017  11:36 AM

## 2017-07-06 LAB — CYTOLOGY - NON PAP

## 2017-07-07 LAB — OLIGOCLONAL BANDS, CSF + SERM

## 2017-07-08 ENCOUNTER — Telehealth: Payer: Self-pay

## 2017-07-08 LAB — CSF CULTURE W GRAM STAIN
Culture: NO GROWTH
Gram Stain: NONE SEEN

## 2017-07-08 LAB — CSF CULTURE

## 2017-07-08 LAB — MYELIN BASIC PROTEIN, CSF: MYELIN BASIC PROTEIN: 1.4 ng/mL — AB (ref 0.0–1.2)

## 2017-07-08 NOTE — Telephone Encounter (Signed)
EMMI Follow-Up: Noted on the report that the patient had other questions and a unfilled Rx.  I talked with Carly Martin and she was going to pick up her Nicotine patches this afternoon and said she was sleeping a lot and just felt fatigued all the time. Said she would contact Dr. Thad Ranger to see if there was anything she could take in the meantime since her follow-up appointment wasn't until June 19th. Let her know there would be a 2nd automated call and to contact us if she had any concerns.  No needs today.

## 2017-07-09 LAB — ANAEROBIC CULTURE

## 2017-07-11 LAB — IGG CSF INDEX
ALBUMIN: 4.3 g/dL (ref 3.5–5.5)
Albumin CSF-mCnc: 9 mg/dL — ABNORMAL LOW (ref 11–48)
IGG INDEX CSF: 1.5 — AB (ref 0.0–0.7)
IGG/ALB RATIO, CSF: 0.46 — AB (ref 0.00–0.25)
IgG (Immunoglobin G), Serum: 1321 mg/dL (ref 700–1600)
IgG, CSF: 4.1 mg/dL (ref 0.0–8.6)

## 2017-08-03 LAB — FUNGAL ORGANISM REFLEX

## 2017-08-03 LAB — FUNGUS CULTURE RESULT

## 2017-08-03 LAB — FUNGUS CULTURE WITH STAIN

## 2017-08-31 ENCOUNTER — Telehealth: Payer: Self-pay

## 2017-08-31 NOTE — Telephone Encounter (Signed)
Courtney from Dr [facility] office called stating the patient had titers done and she is not immune to Hep B. The patient has MS and is about to begin infusions with their office and she will need Heb B Booster shot. They do not carry this injection. Wanted to know if our office will administer to the patient. Called back and let her know we can give patient the booster and I will contact her to have her scheduled for nurse visit.  Called the patient and left a VM informing her to call the office and be scheduled on nurse visit for Hep B booster shot. Awaiting call back to be scheduled.

## 2017-09-01 ENCOUNTER — Ambulatory Visit: Payer: POS

## 2017-09-01 DIAGNOSIS — Z23 Encounter for immunization: Secondary | ICD-10-CM

## 2017-09-01 MED ORDER — HEPATITIS B VAC RECOMBINANT 10 MCG/ML IJ SUSP
1.0000 mL | Freq: Once | INTRAMUSCULAR | Status: AC
Start: 2017-09-01 — End: 2017-09-01
  Administered 2017-09-01: 10 ug via INTRAMUSCULAR

## 2017-09-02 ENCOUNTER — Ambulatory Visit: Payer: Self-pay

## 2017-09-21 ENCOUNTER — Ambulatory Visit: Payer: POS | Admitting: Internal Medicine

## 2017-09-21 ENCOUNTER — Other Ambulatory Visit: Payer: Self-pay | Admitting: Hematology and Oncology

## 2017-09-21 ENCOUNTER — Inpatient Hospital Stay: Payer: POS | Attending: Hematology and Oncology | Admitting: Hematology and Oncology

## 2017-09-21 ENCOUNTER — Encounter: Payer: Self-pay | Admitting: Hematology and Oncology

## 2017-09-21 ENCOUNTER — Inpatient Hospital Stay: Payer: POS

## 2017-09-21 VITALS — BP 115/75 | HR 85 | Temp 98.1°F | Resp 18 | Ht 65.0 in | Wt 213.6 lb

## 2017-09-21 DIAGNOSIS — F17210 Nicotine dependence, cigarettes, uncomplicated: Secondary | ICD-10-CM

## 2017-09-21 DIAGNOSIS — R4701 Aphasia: Secondary | ICD-10-CM

## 2017-09-21 DIAGNOSIS — Z79899 Other long term (current) drug therapy: Secondary | ICD-10-CM | POA: Diagnosis not present

## 2017-09-21 DIAGNOSIS — R51 Headache: Secondary | ICD-10-CM | POA: Diagnosis not present

## 2017-09-21 DIAGNOSIS — G35 Multiple sclerosis: Secondary | ICD-10-CM | POA: Diagnosis present

## 2017-09-21 DIAGNOSIS — Z800 Family history of malignant neoplasm of digestive organs: Secondary | ICD-10-CM | POA: Insufficient documentation

## 2017-09-21 DIAGNOSIS — R200 Anesthesia of skin: Secondary | ICD-10-CM

## 2017-09-21 DIAGNOSIS — Z5112 Encounter for antineoplastic immunotherapy: Secondary | ICD-10-CM | POA: Insufficient documentation

## 2017-09-21 LAB — CBC WITH DIFFERENTIAL/PLATELET
BASOS ABS: 0.1 10*3/uL (ref 0–0.1)
BASOS PCT: 1 %
EOS ABS: 0.3 10*3/uL (ref 0–0.7)
EOS PCT: 5 %
HCT: 38.1 % (ref 35.0–47.0)
Hemoglobin: 12.3 g/dL (ref 12.0–16.0)
LYMPHS PCT: 26 %
Lymphs Abs: 1.8 10*3/uL (ref 1.0–3.6)
MCH: 28.4 pg (ref 26.0–34.0)
MCHC: 32.3 g/dL (ref 32.0–36.0)
MCV: 88 fL (ref 80.0–100.0)
Monocytes Absolute: 0.5 10*3/uL (ref 0.2–0.9)
Monocytes Relative: 7 %
Neutro Abs: 4.2 10*3/uL (ref 1.4–6.5)
Neutrophils Relative %: 61 %
PLATELETS: 297 10*3/uL (ref 150–440)
RBC: 4.33 MIL/uL (ref 3.80–5.20)
RDW: 14.9 % — ABNORMAL HIGH (ref 11.5–14.5)
WBC: 6.9 10*3/uL (ref 3.6–11.0)

## 2017-09-21 LAB — COMPREHENSIVE METABOLIC PANEL
ALBUMIN: 4.1 g/dL (ref 3.5–5.0)
ALT: 12 U/L (ref 0–44)
AST: 14 U/L — AB (ref 15–41)
Alkaline Phosphatase: 48 U/L (ref 38–126)
Anion gap: 10 (ref 5–15)
BUN: 13 mg/dL (ref 6–20)
CHLORIDE: 103 mmol/L (ref 98–111)
CO2: 23 mmol/L (ref 22–32)
CREATININE: 0.77 mg/dL (ref 0.44–1.00)
Calcium: 8.9 mg/dL (ref 8.9–10.3)
GFR calc Af Amer: >60 mL/min (ref >60)
GFR calc non Af Amer: >60 mL/min (ref >60)
GLUCOSE: 91 mg/dL (ref 70–99)
Potassium: 4.2 mmol/L (ref 3.5–5.1)
SODIUM: 136 mmol/L (ref 135–145)
Total Bilirubin: 1.1 mg/dL (ref 0.3–1.2)
Total Protein: 7.6 g/dL (ref 6.5–8.1)

## 2017-09-21 NOTE — Progress Notes (Signed)
Patient here today as new evaluation regarding MS.  Referred by Dr. Malvin Johns.

## 2017-09-21 NOTE — Progress Notes (Signed)
[facility] day:  09/21/2017  Chief Complaint: Carly Martin is a 32 y.o. female with multiple sclerosis who is referred for initiation of Rituxan by Dr. Gurney Maxin.  HPI:  She describes a history of numbness and tingling throughout the years.  She was initially seen by Dr. Melrose Nakayama in 09/2015.  She presented to urgent care on 06/30/2017 with difficulty walking and numbness.  She was diagnosed with multiple sclerosis on 07/04/2017.  She presented with complains of  poor memory, difficulty focusing, daily headaches, numbness in her legs, bladder accidents (can't feel to urinate), intermittent tremors, pain and blurriness in right eye, pain in hands to elbows,   Patient was admitted to [facility] from 07/03/2017 - 07/05/2017 with left leg tingling and numbness.  She received high-dose steroids x 3 days with resolution of symptoms.  CBC revealed a hematocrit of 37.3, hemoglobin 12.3, MCV 88.8, platelets 290,000, WBC 7400 with an ANC of 4400.  Creatinine was 0.68.  LFTs were normal.  She received steroids.  She underwent LP on 07/04/2017.  Eight oligoclonal bands were observed in the CSF.  MRI cervical and thoracic spine on 07/03/2017 revealed no evidence of a demyelinating disease.  Head MRI on 07/03/2017 revealed scattered foci of abnormal T2 and FLAIR signal in the frontal deep and subcortical white matter and in the parietal cortical and subcortical brain.  Patient notes that she felt well during her admission to the hospital and for "about a week" afterwards. She notes that symptoms returned. During a follow up with Dr. Melrose Nakayama, she notes that she was advised that "symptoms will be there until she gets on a treatment regimen". Plan was for Rituxan following consultation and approval with Infectious Disease.  Rituxan to be given 1000 mg with repeat in 2 weeks then every 7 months.  Labs on 07/18/2017 revealed: Hepatitis C antibody < 0.1.  Hepatitis B  surface antibody 7.4 (immunity > 9.9).  Hepatitis B core antibody total negative.  Quantiferon gold negative.  Lyme IgG/IgM < 0.91 and IgM quantitative 1.08 (0-0.79).  JC virus antibody index was 3.01 (positive).  Patient was seen in consult on 09/05/2017 by Dr. Jacqulynn Cadet [facility] neurology). Notes reviewed.  She was seen at [facility] following recommendation from Dr. Melrose Nakayama who wanted her to see a "MS specialist". Patient notes that she had recent (+) MOG testing. She spoke with Dr. Ron Parker who advised that testing would be repeated in 6 months.  As she has a high JCV Ab positive, Tysabri is not an option. If NMO-IgG and MOG-IgG are both negative, the diagnosis of definite MS can be confirmed and Ocrelizumab would be recommended. If NMO-IgG or MOG-IgG positive, the recommendation would be Rituximab.  Symptomatically, patient continues to have numbness and "pins and needles" sensation mainly in her RIGHT upper extremity. She has numbness in her feet. Patient with generalized weakness in her upper and lower extremities. She states, "I don't have a good grip strength at times". Patient advising that she has intermittent episodes of expressive aphasia. She reportedly was "falling a lot" last year, which she now attributes to her MS diagnosis.  Patient has received a "hepatitis B booster" with her PCP within the last 2 weeks.  She is unsure if she received the hepatitis B series previously.  Pain has pain in her RIGHT eye, mainly associated with an upward gaze. She has intermittent blurred vision in the same eye. Patient notes that she has been seen by ophthalmology and  advised that "everything was ok". She is scheduled to follow up in 1 year. Patient has intermittent headaches. Patient denies any B symptoms. She denies any interval infections. She experiences exercised induced asthma.   Patient advises that she maintains an adequate appetite. She is eating well. Weight today is 213 lb 10 oz (96.9 kg).   She has  fatigue.  Patient denies pain in the clinic today.  Patient denies family history that is significant for any type of hematologic  or neurologic disorders. Maternal and paternal grandfathers had colon cancer.    Past Medical History:  Diagnosis Date  . Asthma     Past Surgical History:  Procedure Laterality Date  . CERVICAL BIOPSY  W/ LOOP ELECTRODE EXCISION  2008  . TONSILLECTOMY      Family History  Problem Relation Age of Onset  . Migraines Sister   . Cancer Maternal Grandfather   . Cancer Paternal Grandfather     Social History:  reports that she has been smoking cigarettes. She has a 2.50 pack-year smoking history. She has never used smokeless tobacco. She reports that she drinks alcohol. She reports that she does not use drugs.  She smokes 5-6 cigarettes/day.  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  She lives in [place].  The patient is alone today.  Allergies:  Allergies  Allergen Reactions  . Codeine Shortness Of Breath    Current Medications: Current Outpatient Medications  Medication Sig Dispense Refill  . albuterol (PROVENTIL HFA;VENTOLIN HFA) 108 (90 Base) MCG/ACT inhaler Inhale 1-2 puffs into the lungs every 6 (six) hours as needed for wheezing or shortness of breath. 1 Inhaler 0  . etonogestrel (IMPLANON) 68 MG IMPL implant 1 each by Subdermal route once.    . fluticasone (FLONASE) 50 MCG/ACT nasal spray Place 2 sprays into both nostrils daily. (Patient taking differently: Place 2 sprays into both nostrils daily as needed for allergies. ) 16 g 0  . buPROPion (WELLBUTRIN XL) 150 MG 24 hr tablet Take 150 mg by mouth daily.  1  . nicotine (NICODERM CQ - DOSED IN MG/24 HOURS) 14 mg/24hr patch Place 1 patch (14 mg total) onto the skin daily. (Patient not taking: Reported on 09/21/2017) 30 patch 0   No current facility-administered medications for this visit.     Review of Systems   Constitutional: Negative for diaphoresis, fever, malaise/fatigue and weight loss.       "I feel pretty good".  HENT: Negative.   Eyes: Positive for blurred vision (main in RIGHT eye and associated with wpward gaze - followed by opthalmology).  Respiratory: Negative for cough, hemoptysis, sputum production and shortness of breath.        Exercise induced asthma  Cardiovascular: Negative for chest pain, palpitations, orthopnea, leg swelling and PND.  Gastrointestinal: Negative for abdominal pain, blood in stool, constipation, diarrhea, melena, nausea and vomiting.  Genitourinary: Negative for dysuria, frequency, hematuria and urgency.  Musculoskeletal: Positive for falls (PMH (+) for falls associated with weakness releated to her MS). Negative for back pain, joint pain and myalgias.  Skin: Negative for itching and rash.  Neurological: Positive for dizziness, sensory change (Upper and lower extremities - transient), speech change (intermittent/infrequent episodes of expressive aphasia), focal weakness (upper and lower extremities related to newly diagnosed MS), weakness (generalized) and headaches. Negative for tremors.       Multiple Sclerosis  Endo/Heme/Allergies: Does not bruise/bleed easily.  Psychiatric/Behavioral: Negative for depression,  memory loss and suicidal ideas. The patient is not nervous/anxious and does not have insomnia.   All other systems reviewed and are negative.  Performance status (ECOG): 1 - Symptomatic but completely ambulatory  Vital Signs BP 115/75 (BP Location: Left Arm, Patient Position: Standing;Sitting)   Pulse 85   Temp 98.1 F (36.7 C) (Tympanic)   Resp 18   Ht _0  (1.651 m)   Wt 213 lb 10 oz (96.9 kg)   BMI 35.55 kg/m   Physical Exam  Constitutional: She is oriented to person, place, and time and well-developed, well-nourished, and in no distress.  HENT:  Head: Normocephalic and atraumatic.  Eyes: Pupils are equal, round, and reactive to light. EOM  are normal. No scleral icterus.  Neck: Normal range of motion. Neck supple. No tracheal deviation present. No thyromegaly present.  Cardiovascular: Normal rate, regular rhythm and normal heart sounds. Exam reveals no gallop and no friction rub.  No murmur heard. Pulmonary/Chest: Effort normal and breath sounds normal. No respiratory distress. She has no wheezes. She has no rales.  Abdominal: Soft. Bowel sounds are normal. She exhibits no distension. There is no tenderness.  Musculoskeletal: Normal range of motion. She exhibits no edema or tenderness.  Lymphadenopathy:    She has no cervical adenopathy.    She has no axillary adenopathy.       Right: No inguinal and no supraclavicular adenopathy present.       Left: No inguinal and no supraclavicular adenopathy present.  Neurological: She is alert and oriented to person, place, and time. She has normal sensation, normal strength, normal reflexes and intact cranial nerves. She displays normal speech and normal reflexes. No sensory deficit. She has a normal Cerebellar Exam, a normal Finger-Nose-Finger Test, a normal Romberg Test and a normal Tandem Gait Test. She shows no pronator drift. Gait normal. Coordination and gait normal.  Skin: Skin is warm and dry. No rash noted. No erythema.  Psychiatric: Mood, affect and judgment normal.  Nursing note and vitals reviewed.    No visits with results within 3 Day(s) from this visit.  Latest known visit with results is:  Admission on 07/03/2017, Discharged on 07/05/2017  Component Date Value Ref Range Status  . WBC 07/03/2017 7.4  3.6 - 11.0 K/uL Final  . RBC 07/03/2017 4.20  3.80 - 5.20 MIL/uL Final  . Hemoglobin 07/03/2017 12.3  12.0 - 16.0 g/dL Final  . HCT 07/03/2017 37.3  35.0 - 47.0 % Final  . MCV 07/03/2017 88.8  80.0 - 100.0 fL Final  . MCH 07/03/2017 29.3  26.0 - 34.0 pg Final  . MCHC 07/03/2017 32.9  32.0 - 36.0 g/dL Final  . RDW 07/03/2017 14.3  11.5 - 14.5 % Final  . Platelets  07/03/2017 290  150 - 440 K/uL Final  . Neutrophils Relative % 07/03/2017 59  % Final  . Neutro Abs 07/03/2017 4.4  1.4 - 6.5 K/uL Final  . Lymphocytes Relative 07/03/2017 32  % Final  . Lymphs Abs 07/03/2017 2.3  1.0 - 3.6 K/uL Final  . Monocytes Relative 07/03/2017 6  % Final  . Monocytes Absolute 07/03/2017 0.5  0.2 - 0.9 K/uL Final  . Eosinophils Relative 07/03/2017 2  % Final  . Eosinophils Absolute 07/03/2017 0.1  0 - 0.7 K/uL Final  . Basophils Relative 07/03/2017 1  % Final  . Basophils Absolute 07/03/2017 0.1  0 - 0.1 K/uL Final   Performed at [facility], [address], [place], [place] [address]  .  Sodium 07/03/2017 136  135 - 145 mmol/L Final  . Potassium 07/03/2017 3.9  3.5 - 5.1 mmol/L Final  . Chloride 07/03/2017 105  101 - 111 mmol/L Final  . CO2 07/03/2017 21* 22 - 32 mmol/L Final  . Glucose, Bld 07/03/2017 103* 65 - 99 mg/dL Final  . BUN 07/03/2017 15  6 - 20 mg/dL Final  . Creatinine, Ser 07/03/2017 0.68  0.44 - 1.00 mg/dL Final  . Calcium 07/03/2017 8.9  8.9 - 10.3 mg/dL Final  . Total Protein 07/03/2017 7.7  6.5 - 8.1 g/dL Final  . Albumin 07/03/2017 4.1  3.5 - 5.0 g/dL Final  . AST 07/03/2017 19  15 - 41 U/L Final  . ALT 07/03/2017 15  14 - 54 U/L Final  . Alkaline Phosphatase 07/03/2017 49  38 - 126 U/L Final  . Total Bilirubin 07/03/2017 0.8  0.3 - 1.2 mg/dL Final  . GFR calc non Af Amer 07/03/2017 >60  >60 mL/min Final  . GFR calc Af Amer 07/03/2017 >60  >60 mL/min Final   Comment: (NOTE) The eGFR has been calculated using the CKD EPI equation. This calculation has not been validated in all clinical situations. eGFR's persistently <60 mL/min signify possible Chronic Kidney Disease.   Georgiann Hahn gap 07/03/2017 10  5 - 15 Final   Performed at [facility], [address] [place], [place] [address]  . Magnesium 07/03/2017 1.8  1.7 - 2.4 mg/dL Final   Performed at [facility], [address] [place], [place] [address]  .  Color, Urine 07/04/2017 YELLOW* YELLOW Final  . APPearance 07/04/2017 CLOUDY* CLEAR Final  . Specific Gravity, Urine 07/04/2017 1.013  1.005 - 1.030 Final  . pH 07/04/2017 5.0  5.0 - 8.0 Final  . Glucose, UA 07/04/2017 >=500* NEGATIVE mg/dL Final  . Hgb urine dipstick 07/04/2017 NEGATIVE  NEGATIVE Final  . Bilirubin Urine 07/04/2017 NEGATIVE  NEGATIVE Final  . Ketones, ur 07/04/2017 NEGATIVE  NEGATIVE mg/dL Final  . Protein, ur 07/04/2017 NEGATIVE  NEGATIVE mg/dL Final  . Nitrite 07/04/2017 NEGATIVE  NEGATIVE Final  . Leukocytes, UA 07/04/2017 NEGATIVE  NEGATIVE Final  . RBC / HPF 07/04/2017 0-5  0 - 5 RBC/hpf Final  . WBC, UA 07/04/2017 0-5  0 - 5 WBC/hpf Final  . Bacteria, UA 07/04/2017 NONE SEEN  NONE SEEN Final  . Squamous Epithelial / LPF 07/04/2017 6-10  0 - 5 Final  . Mucus 07/04/2017 PRESENT   Final   Performed at [facility], [address] [place], [place] [address]  . HIV Screen 4th Generation wRfx 07/04/2017 Non Reactive  Non Reactive Final   Comment: (NOTE) Performed At: [facility] [place], [place] [id number] Rush Farmer MD [id number] Performed at [facility], [address] [place], [place] [address]   . Sodium 07/04/2017 134* 135 - 145 mmol/L Final  . Potassium 07/04/2017 4.0  3.5 - 5.1 mmol/L Final  . Chloride 07/04/2017 107  101 - 111 mmol/L Final  . CO2 07/04/2017 20* 22 - 32 mmol/L Final  . Glucose, Bld 07/04/2017 151* 65 - 99 mg/dL Final  . BUN 07/04/2017 14  6 - 20 mg/dL Final  . Creatinine, Ser 07/04/2017 0.64  0.44 - 1.00 mg/dL Final  . Calcium 07/04/2017 9.0  8.9 - 10.3 mg/dL Final  . GFR calc non Af Amer 07/04/2017 >60  >60 mL/min Final  . GFR calc Af Amer 07/04/2017 >60  >60 mL/min Final   Comment: (NOTE)  The eGFR has been calculated using the CKD EPI equation. This calculation has not been validated in all clinical situations. eGFR's persistently <60 mL/min signify possible Chronic  Kidney Disease.   Georgiann Hahn gap 07/04/2017 7  5 - 15 Final   Performed at [facility], [address] [place], [place] [address]  . WBC 07/04/2017 7.6  3.6 - 11.0 K/uL Final  . RBC 07/04/2017 4.29  3.80 - 5.20 MIL/uL Final  . Hemoglobin 07/04/2017 12.4  12.0 - 16.0 g/dL Final  . HCT 07/04/2017 38.2  35.0 - 47.0 % Final  . MCV 07/04/2017 89.1  80.0 - 100.0 fL Final  . MCH 07/04/2017 29.0  26.0 - 34.0 pg Final  . MCHC 07/04/2017 32.6  32.0 - 36.0 g/dL Final  . RDW 07/04/2017 14.3  11.5 - 14.5 % Final  . Platelets 07/04/2017 286  150 - 440 K/uL Final   Performed at [facility], [address] [place], [place] [address]  . Prothrombin Time 07/04/2017 14.1  11.4 - 15.2 seconds Final  . INR 07/04/2017 1.10   Final   Performed at [facility], [address] [place], [place] [address]  . CYTOLOGY - NON GYN 07/04/2017    Final                   Value:Cytology - Non PAP CASE: [id number] PATIENT: Mamie Nick Non-Gyn Cytology Report     SPECIMEN SUBMITTED: A. CSF  CLINICAL HISTORY: None Provided  PRE-OPERATIVE DIAGNOSIS: Lumbar puncture performed due to concern for multiple sclerosis  POST-OPERATIVE DIAGNOSIS: None provided.     DIAGNOSIS: A. CEREBROSPINAL FLUID; LUMBAR PUNCTURE: - NEGATIVE FOR MALIGNANCY. - LYMPHOCYTES AND A RARE MACROPHAGE IN A VERY LOW CELLULARITY SAMPLE (2 WBC PER CUBIC MM).  Comment: Slides reviewed: 1 cytospin slide, 1 ThinPrep slide, and 1 cell block   GROSS DESCRIPTION: A. Labeled: Medical record number and patient name Received: Fresh Volume: 1 mL Description: Clear fluid in an 8 mL container Submitted for ThinPrep and one cell block    Final Diagnosis performed by Bryan Lemma, MD.   Electronically signed 07/06/2017 6:17:06PM The electronic signature indicates that the named Attending Pathologist has evaluated the specimen  Technical component performed at [facility], [address], [place], [place] [address] Lab: [phone] Dir: Rush Farmer, MD, MMM  Professional component performed at [facility], [facility], [address], [place], [place] [address] Lab: [id number] Dir: Dellia Nims. Rubinas, MD   . Glucose, CSF 07/04/2017 87* 40 - 70 mg/dL Final   Performed at [facility], [address] [place], [place] [address]  . Total  Protein, CSF 07/04/2017 18  15 - 45 mg/dL Final   Performed at [facility], [address] [place], [place] [address]  . Tube # 07/04/2017 3   Final  . Color, CSF 07/04/2017 COLORLESS  COLORLESS Final  . Appearance, CSF 07/04/2017 CLEAR  CLEAR Final  . RBC Count, CSF 07/04/2017 20* 0 - 3 /cu mm Final  . WBC, CSF 07/04/2017 2  0 - 5 /cu mm Final  . Segmented Neutrophils-CSF 07/04/2017 0  % Final  . Lymphs, CSF 07/04/2017 95  % Final  . Monocyte-Macrophage-Spinal Fluid 07/04/2017 5  % Final  . Eosinophils, CSF 07/04/2017 0  % Final  . Other Cells, CSF 07/04/2017 0  Final   Performed at [facility], [address], [place], [place] [address]  . Specimen Description 07/04/2017    Final                   Value:CSF Performed at [facility], [address] [place], [place] [address]   . Special Requests 07/04/2017    Final                   Value:NONE Performed at [facility], [address] [place], [place] [address]   . Culture 07/04/2017    Final                   Value:NO ANAEROBES ISOLATED Performed at [facility], [place] [address] [place], [place] [address]   . Report Status 07/04/2017 07/09/2017 FINAL   Final  . Specimen Description 07/04/2017    Final                   Value:CSF Performed at [facility], [address] [place], [place] [address]   . Special Requests 07/04/2017    Final                   Value:NONE Performed at [facility], [address] [place], [place] [address]   . Gram Stain 07/04/2017    Final                    Value:NO ORGANISMS SEEN FEW WBC SEEN Performed at [facility], [address] [place], [place] [address]   . Culture 07/04/2017    Final                   Value:NO GROWTH 3 DAYS Performed at [facility], [place] [address] [place], [place] [address]   . Report Status 07/04/2017 07/08/2017 FINAL   Final  . Fungus Stain 07/04/2017 Final report   Final  . Fungus (Mycology) Culture 07/04/2017 Final report   Final   Comment: (NOTE) Performed At: [facility] [place], [place] [id number] Rush Farmer MD QB:[id number]   . Fungal Source 07/04/2017 CSF   Final   Performed at [facility], [address] [place], [place] [address]  . CSF Oligoclonal Bands 07/04/2017 Comment   Final   Comment: (NOTE) Eight (8) oligoclonal bands were observed in the CSF, which were not detected in the serum sample. Interpretation: Criteria for Positivity: Four (4) or more oligoclonal bands observed  only in the CSF have been shown to be most consistent with MS  using our method. [Fortini AS, Sanders EL, Weinshenker BG, and Katzmann JA: Cerebrospinal Fluid Oligoclonal Bands in the Diagnosis  of Multiple Sclerosis. Am J Clin Pathol 120(5):672-675, 2003]. Oligoclonal bands that are present only in the CSF have been associated with a variety of inflammatory brain diseases such as multiple sclerosis (MS), subacute encephalitis, neurosyphilis, etc.  Increased IgG in the CSF is not specific for MS, but is an indication  of chronic neural inflammation. Clinical correlation indicated. Approximately 2-3% of clinically confirmed MS patients show little or no evidence of oligoclonal bands in the CSF; however oligoclonal  bands may develop as the disease progresses. Oligoclonal Banding                           testing performed using Isoelectric Focusing (IEF) and immunoblotting methodology. Performed  At: [facility] [place], [place] [id number] Rush Farmer MD TO:[id number] Performed at [facility], [address] [place], [place] [address]   . IgG, CSF 07/04/2017 4.1  0.0 - 8.6 mg/dL Final  . Albumin CSF-mCnc 07/04/2017 9* 11 - 48 mg/dL Final  . IgG (Immunoglobin G), Serum 07/04/2017 1,321  700 - 1,600 mg/dL Final  . Albumin 07/04/2017 4.3  3.5 - 5.5 g/dL Final  . IgG/Alb Ratio, CSF 07/04/2017 0.46* 0.00 - 0.25 Final  . CSF IgG Index 07/04/2017 1.5* 0.0 - 0.7 Final   Comment: (NOTE) Performed At: [facility] [place], [place] [id number] Rush Farmer MD AL:[id number] Performed at [facility], [address] [place], [place] [address]   . Myelin Basic Protein 07/04/2017 1.4* 0.0 - 1.2 ng/mL Final   Comment: (NOTE) Results for this test are for research purposes only by the assay's manufacturer.  The performance characteristics of this product have not been established.  Results should not be used as a diagnostic procedure without confirmation of the diagnosis by another medically established diagnostic product or procedure. Performed At: [facility] [place], [place] [id number] Rush Farmer MD DQ:[id number] Performed at [facility], [address] [place], [place] [address]   . Result 1 07/04/2017 Comment   Final   Comment: (NOTE) KOH/Calcofluor preparation:  no fungus observed. Performed At: [facility] [place], [place] [id number] Rush Farmer MD HU:[id number] Performed at [facility], [address] [place], [place] [address]   . Fungal result 1 07/04/2017 Comment   Final   Comment: (NOTE) No yeast or mold isolated after 4 weeks. Performed At: [facility] [place], [place] [id number] Rush Farmer MD OM:[id number] Performed at [facility], [address] [place], [place] [address]     Assessment:  Carly Martin is a  32 y.o. female with multiple sclerosis.  She was diagnosed on 07/04/2017.    Patient was admitted to [facility] from 07/03/2017 - 07/05/2017 with left leg tingling and numbness.  She received high-dose steroids x 3 days with resolution of symptoms.    LP on 07/04/2017 revealed eight oligoclonal bands were observed in the CSF.  MRI cervical and thoracic spine on 07/03/2017 revealed no evidence of a demyelinating disease.  Head MRI on 07/03/2017 revealed scattered foci of abnormal T2 and FLAIR signal in the frontal deep and subcortical white matter and in the parietal cortical and subcortical brain.  She has + MOG testing.  Labs on 07/18/2017 revealed: Hepatitis C antibody < 0.1.  Hepatitis B surface antibody 7.4 (immunity > 9.9).  Hepatitis B core antibody total negative.  Quantiferon gold negative.  Lyme IgG/IgM < 0.91 and IgM quantitative 1.08 (0-0.79).  JC virus antibody index was 3.01 (positive).  Symptomatically, she is having a good day.  She has intermittent numbness and tingling in her fingertips.  Plan: 1. Labs:  CBC with diff, CMP, HBV sAg, HBV cAb 2. Multiple sclerosis - treatment ongoing  Doing well today. No focal weakness or overt symptoms in clinic  Followed by neurology at both [facility], MD) and [facility], MD)  Discuss need to be seen in consult by infectious disease for Rituxan clearance prior to initiation of therapy. Patient has (+) Lyme titers and a (+) JCV Ab titer.   Recheck hepatitis serologies.  Discuss potential reactivation of hepatitis B on Rituxan if patient had a previous infection.  Discuss progressive  multifocal leukoencephalopathy (PML) as potential side effect of Rituxan.  Will need to consult further with neurology prior to initiation of rituximab.  Dr Melrose Nakayama contacted- agree with holding treatment today. 3. RTC - patient will need to call for an appointment for initiation of Rituxan.    Honor Loh, NP  09/21/2017, 9:17 AM   I saw and evaluated the  patient, participating in the key portions of the service and reviewing pertinent diagnostic studies and records.  I reviewed the nurse practitioner's note and agree with the findings and the plan.  The assessment and plan were discussed with the patient.  Several questions were asked by the patient and answered.   Nolon Stalls, MD 09/21/2017,9:17 AM

## 2017-09-22 LAB — HEPATITIS B SURFACE ANTIGEN: HEP B S AG: NEGATIVE

## 2017-09-22 LAB — HEPATITIS B CORE ANTIBODY, TOTAL: HEP B C TOTAL AB: NEGATIVE

## 2017-09-23 ENCOUNTER — Encounter: Payer: Self-pay | Admitting: Internal Medicine

## 2017-09-23 ENCOUNTER — Ambulatory Visit: Payer: POS | Admitting: Internal Medicine

## 2017-09-23 VITALS — BP 110/68 | HR 83 | Ht 65.0 in | Wt 211.0 lb

## 2017-09-23 DIAGNOSIS — G8929 Other chronic pain: Secondary | ICD-10-CM

## 2017-09-23 DIAGNOSIS — M546 Pain in thoracic spine: Secondary | ICD-10-CM | POA: Diagnosis not present

## 2017-09-23 DIAGNOSIS — G35 Multiple sclerosis: Secondary | ICD-10-CM | POA: Diagnosis not present

## 2017-09-23 DIAGNOSIS — F331 Major depressive disorder, recurrent, moderate: Secondary | ICD-10-CM | POA: Diagnosis not present

## 2017-09-23 MED ORDER — PREDNISONE 10 MG PO TABS: ORAL_TABLET | ORAL | 0 refills | Status: DC

## 2017-09-23 NOTE — Progress Notes (Signed)
Date:  09/23/2017   Name:  Carly Martin   DOB:  [birth year]   MRN:  [id number]   Chief Complaint: Back Pain (In june had spinal tap. Has not healed since. Can;t go back to person who did it because they don't have a clinic. Hurts when laying back too fast. Feels like bones are grinding together. With intercourse it hurts to do certain positions. ) and Depression (PHQ9- 6)  Back Pain  This is a new problem. Episode onset: 2 months after LP. The problem occurs daily. The problem has been waxing and waning since onset. The pain is present in the lumbar spine. The quality of the pain is described as shooting. The pain does not radiate. The pain is mild. Pertinent negatives include no chest pain, fever or headaches.  Depression         This is a chronic problem.The problem is unchanged.  Associated symptoms include myalgias.  Associated symptoms include no fatigue and no headaches.  Past treatments include SSRIs - Selective serotonin reuptake inhibitors (lexapro stopped and bupropion started). MS - she is just getting ready to start treatment.  She is holding up well, now more relieved that she knows what all of her previous sx are due to.  She will likely start therapy next Tuesday.   Review of Systems  Constitutional: Negative for chills, fatigue and fever.  Respiratory: Negative for cough, chest tightness, shortness of breath and wheezing.   Cardiovascular: Negative for chest pain and palpitations.  Musculoskeletal: Positive for back pain and myalgias.  Neurological: Negative for dizziness and headaches.  Psychiatric/Behavioral: Positive for depression. Negative for dysphoric mood and sleep disturbance. The patient is not nervous/anxious.     Patient Active Problem List   Diagnosis Date Noted  . Multiple sclerosis (HCC) 07/03/2017  . Right upper quadrant abdominal pain 09/28/2016  . Irritable bowel syndrome with constipation 09/28/2016  . Moderate episode of recurrent major  depressive disorder (HCC) 09/28/2016  . Lightheadedness 03/03/2016  . Numbness and tingling 09/03/2015  . Chronic tension-type headache, not intractable 09/03/2015  . Sciatica of right side 03/17/2015  . Iron deficiency anemia 03/17/2015  . History of loop electrical excision procedure (LEEP) 09/28/2013    Allergies  Allergen Reactions  . Codeine Shortness Of Breath    Past Surgical History:  Procedure Laterality Date  . CERVICAL BIOPSY  W/ LOOP ELECTRODE EXCISION  2008  . TONSILLECTOMY      Social History   Tobacco Use  . Smoking status: Current Every Day Smoker    Packs/day: 0.25    Years: 10.00    Pack years: 2.50    Types: Cigarettes  . Smokeless tobacco: Never Used  Substance Use Topics  . Alcohol use: Yes    Alcohol/week: 0.0 standard drinks    Comment: socially  . Drug use: No     Medication list has been reviewed and updated.  Current Meds  Medication Sig  . albuterol (PROVENTIL HFA;VENTOLIN HFA) 108 (90 Base) MCG/ACT inhaler Inhale 1-2 puffs into the lungs every 6 (six) hours as needed for wheezing or shortness of breath.  Marland Kitchen buPROPion (WELLBUTRIN XL) 150 MG 24 hr tablet Take 150 mg by mouth daily.  Marland Kitchen etonogestrel (IMPLANON) 68 MG IMPL implant 1 each by Subdermal route once.  . fluticasone (FLONASE) 50 MCG/ACT nasal spray Place 2 sprays into both nostrils daily. (Patient taking differently: Place 2 sprays into both nostrils daily as needed for allergies. )  . nicotine (NICODERM CQ -  DOSED IN MG/24 HOURS) 14 mg/24hr patch Place 1 patch (14 mg total) onto the skin daily.    PHQ 2/9 Scores 09/23/2017 03/02/2017 09/28/2016  PHQ - 2 Score 0 6 6  PHQ- 9 Score 6 22 16     Physical Exam  Constitutional: She is oriented to person, place, and time. She appears well-developed. No distress.  HENT:  Head: Normocephalic and atraumatic.  Neck: Normal range of motion. Neck supple.  Cardiovascular: Normal rate, regular rhythm and normal heart sounds.  Pulmonary/Chest:  Effort normal and breath sounds normal. No respiratory distress.  Musculoskeletal: Normal range of motion.       Thoracic back: She exhibits tenderness. She exhibits no edema and no spasm.  Mild tissue fullness midline at LP site with tenderness  Neurological: She is alert and oriented to person, place, and time.  Skin: Skin is warm and dry. No rash noted.  Psychiatric: She has a normal mood and affect. Her behavior is normal. Thought content normal.  Nursing note and vitals reviewed.   BP 110/68 (BP Location: Right Arm, Patient Position: Sitting, Cuff Size: Normal)   Pulse 83   Ht 5\' 5"  (1.651 m)   Wt 211 lb (95.7 kg)   SpO2 100%   BMI 35.11 kg/m   Assessment and Plan: 1. Chronic midline thoracic back pain Will try short pulse dose prednisone that she will finish before she starts MS treatment Use heat twice a day - predniSONE (DELTASONE) 10 MG tablet; Take 4 tabs daily for 4 days  Dispense: 16 tablet; Refill: 0  2. Multiple sclerosis (HCC) Under care of Dr. Malvin Johns  3. Moderate episode of recurrent major depressive disorder (HCC) Continue Wellbutrin   Meds ordered this encounter  Medications  . predniSONE (DELTASONE) 10 MG tablet    Sig: Take 4 tabs daily for 4 days    Dispense:  16 tablet    Refill:  0    Partially dictated using Animal nutritionist. Any errors are unintentional.  Bari Edward, MD [facility]  09/23/2017

## 2017-09-24 ENCOUNTER — Encounter: Payer: Self-pay | Admitting: Hematology and Oncology

## 2017-09-26 ENCOUNTER — Encounter: Payer: Self-pay | Admitting: Hematology and Oncology

## 2017-09-28 ENCOUNTER — Other Ambulatory Visit: Payer: Self-pay | Admitting: Hematology and Oncology

## 2017-09-28 ENCOUNTER — Inpatient Hospital Stay: Payer: POS

## 2017-09-28 VITALS — BP 120/77 | HR 81 | Temp 96.8°F | Resp 18

## 2017-09-28 DIAGNOSIS — Z5112 Encounter for antineoplastic immunotherapy: Secondary | ICD-10-CM | POA: Diagnosis not present

## 2017-09-28 DIAGNOSIS — G35 Multiple sclerosis: Secondary | ICD-10-CM

## 2017-09-28 MED ORDER — METHYLPREDNISOLONE SODIUM SUCC 125 MG IJ SOLR
100.0000 mg | Freq: Once | INTRAMUSCULAR | Status: AC
  Administered 2017-09-28: 100 mg via INTRAVENOUS

## 2017-09-28 MED ORDER — DIPHENHYDRAMINE HCL 25 MG PO CAPS
50.0000 mg | ORAL_CAPSULE | Freq: Once | ORAL | Status: AC
  Administered 2017-09-28: 50 mg via ORAL

## 2017-09-28 MED ORDER — SODIUM CHLORIDE 0.9 % IV SOLN
Freq: Once | INTRAVENOUS | Status: AC
  Administered 2017-09-28: 10:00:00 via INTRAVENOUS
  Filled 2017-09-28: qty 250

## 2017-09-28 MED ORDER — SODIUM CHLORIDE 0.9 % IV SOLN
1000.0000 mg | Freq: Once | INTRAVENOUS | Status: AC
  Administered 2017-09-28: 1000 mg via INTRAVENOUS
  Filled 2017-09-28: qty 100

## 2017-09-28 MED ORDER — SODIUM CHLORIDE 0.9 % IV SOLN
100.0000 mg | Freq: Once | INTRAVENOUS | Status: DC
  Filled 2017-09-28: qty 0.8

## 2017-09-28 MED ORDER — ACETAMINOPHEN 500 MG PO TABS
1000.0000 mg | ORAL_TABLET | Freq: Once | ORAL | Status: AC
  Administered 2017-09-28: 1000 mg via ORAL

## 2017-10-04 ENCOUNTER — Other Ambulatory Visit: Payer: Self-pay | Admitting: Internal Medicine

## 2017-10-04 DIAGNOSIS — Z23 Encounter for immunization: Secondary | ICD-10-CM

## 2017-10-05 ENCOUNTER — Ambulatory Visit: Payer: POS

## 2017-10-07 ENCOUNTER — Encounter: Payer: Self-pay | Admitting: Hematology and Oncology

## 2017-10-11 ENCOUNTER — Ambulatory Visit: Payer: POS | Admitting: Internal Medicine

## 2017-10-11 ENCOUNTER — Other Ambulatory Visit: Payer: Self-pay

## 2017-10-11 ENCOUNTER — Encounter: Payer: Self-pay | Admitting: Internal Medicine

## 2017-10-11 VITALS — BP 128/78 | HR 98 | Ht 65.0 in | Wt 211.0 lb

## 2017-10-11 DIAGNOSIS — N3001 Acute cystitis with hematuria: Secondary | ICD-10-CM

## 2017-10-11 LAB — POC URINALYSIS WITH MICROSCOPIC (NON AUTO)MANUAL RESULT
BILIRUBIN UA: NEGATIVE
Crystals: 0
Epithelial cells, urine per micros: 0
Glucose, UA: NEGATIVE
Ketones, UA: NEGATIVE
Mucus, UA: 0
PH UA: 6 (ref 5.0–8.0)
Protein, UA: NEGATIVE
RBC: 15 M/uL — AB (ref 4.04–5.48)
Spec Grav, UA: >=1.03 — AB (ref 1.010–1.025)
UROBILINOGEN UA: 0.2 U/dL
WBC CASTS UA: 50

## 2017-10-11 MED ORDER — NITROFURANTOIN MONOHYD MACRO 100 MG PO CAPS: 100.0000 mg | ORAL_CAPSULE | Freq: Two times a day (BID) | ORAL | 0 refills | Status: AC

## 2017-10-11 NOTE — Progress Notes (Signed)
Date:  10/11/2017   Name:  Carly Martin   DOB:  1985-02-17   MRN:  [id number]   Chief Complaint: Urinary Urgency (Urgency and pressure. Feels like not emptying bladder. X 1 week.) Urinary Tract Infection   This is a new problem. The current episode started in the past 7 days. The problem occurs every urination. The patient is experiencing no pain. There has been no fever. Associated symptoms include frequency, hesitancy, nausea and urgency. Pertinent negatives include no chills or discharge. She has tried increased fluids for the symptoms. The treatment provided no relief.     Review of Systems  Constitutional: Negative for chills, fatigue and fever.  Gastrointestinal: Positive for nausea. Negative for diarrhea.  Genitourinary: Positive for dysuria, frequency, hesitancy and urgency.    Patient Active Problem List   Diagnosis Date Noted  . Multiple sclerosis (HCC) 07/03/2017  . Right upper quadrant abdominal pain 09/28/2016  . Irritable bowel syndrome with constipation 09/28/2016  . Moderate episode of recurrent major depressive disorder (HCC) 09/28/2016  . Lightheadedness 03/03/2016  . Numbness and tingling 09/03/2015  . Chronic tension-type headache, not intractable 09/03/2015  . Sciatica of right side 03/17/2015  . Iron deficiency anemia 03/17/2015  . History of loop electrical excision procedure (LEEP) 09/28/2013    Allergies  Allergen Reactions  . Codeine Shortness Of Breath    Past Surgical History:  Procedure Laterality Date  . CERVICAL BIOPSY  W/ LOOP ELECTRODE EXCISION  2008  . TONSILLECTOMY      Social History   Tobacco Use  . Smoking status: Current Every Day Smoker    Packs/day: 0.25    Years: 10.00    Pack years: 2.50    Types: Cigarettes  . Smokeless tobacco: Never Used  Substance Use Topics  . Alcohol use: Yes    Alcohol/week: 0.0 standard drinks    Comment: socially  . Drug use: No     Medication list has been reviewed and  updated.  Current Meds  Medication Sig  . albuterol (PROVENTIL HFA;VENTOLIN HFA) 108 (90 Base) MCG/ACT inhaler Inhale 1-2 puffs into the lungs every 6 (six) hours as needed for wheezing or shortness of breath.  Marland Kitchen buPROPion (WELLBUTRIN XL) 150 MG 24 hr tablet Take 150 mg by mouth daily.  Marland Kitchen etonogestrel (IMPLANON) 68 MG IMPL implant 1 each by Subdermal route once.  . fluticasone (FLONASE) 50 MCG/ACT nasal spray Place 2 sprays into both nostrils daily. (Patient taking differently: Place 2 sprays into both nostrils daily as needed for allergies. )  . nicotine (NICODERM CQ - DOSED IN MG/24 HOURS) 14 mg/24hr patch Place 1 patch (14 mg total) onto the skin daily.    PHQ 2/9 Scores 09/23/2017 03/02/2017 09/28/2016  PHQ - 2 Score 0 6 6  PHQ- 9 Score 6 22 16     Physical Exam  Neck: Normal range of motion. Neck supple.  Cardiovascular: Normal rate, regular rhythm and normal heart sounds.  Pulmonary/Chest: Effort normal and breath sounds normal.  Abdominal: Soft. Bowel sounds are normal. She exhibits no mass. There is no tenderness. There is no guarding.  Musculoskeletal: Normal range of motion.   Urine dipstick shows positive for WBC's, positive for RBC's, positive for nitrates and positive for leukocytes.  Micro exam: 50 WBC's per HPF, 15 RBC's per HPF, 4+ bacteria, 0 crystals seen and 0 casts seen.   BP 128/78 (BP Location: Right Arm, Patient Position: Sitting, Cuff Size: Normal)   Pulse 98   Ht 5\' 5"  (  1.651 m)   Wt 211 lb (95.7 kg)   SpO2 99%   BMI 35.11 kg/m   Assessment and Plan: 1. Acute cystitis with hematuria Continue increased fluids - nitrofurantoin, macrocrystal-monohydrate, (MACROBID) 100 MG capsule; Take 1 capsule (100 mg total) by mouth 2 (two) times daily for 7 days.  Dispense: 14 capsule; Refill: 0   Meds ordered this encounter  Medications  . nitrofurantoin, macrocrystal-monohydrate, (MACROBID) 100 MG capsule    Sig: Take 1 capsule (100 mg total) by mouth 2 (two) times  daily for 7 days.    Dispense:  14 capsule    Refill:  0    Partially dictated using Animal nutritionist. Any errors are unintentional.  Bari Edward, MD [facility]  10/11/2017

## 2017-10-12 ENCOUNTER — Other Ambulatory Visit: Payer: Self-pay | Admitting: Hematology and Oncology

## 2017-10-12 ENCOUNTER — Inpatient Hospital Stay: Payer: POS | Attending: Hematology and Oncology

## 2017-10-12 VITALS — BP 109/70 | HR 87 | Temp 97.7°F | Resp 18

## 2017-10-12 DIAGNOSIS — G35 Multiple sclerosis: Secondary | ICD-10-CM

## 2017-10-12 DIAGNOSIS — Z5112 Encounter for antineoplastic immunotherapy: Secondary | ICD-10-CM | POA: Diagnosis present

## 2017-10-12 MED ORDER — DIPHENHYDRAMINE HCL 25 MG PO CAPS
50.0000 mg | ORAL_CAPSULE | Freq: Once | ORAL | Status: AC
  Administered 2017-10-12: 50 mg via ORAL
  Filled 2017-10-12: qty 2

## 2017-10-12 MED ORDER — SODIUM CHLORIDE 0.9 % IV SOLN
Freq: Once | INTRAVENOUS | Status: AC
  Administered 2017-10-12: 10:00:00 via INTRAVENOUS
  Filled 2017-10-12: qty 250

## 2017-10-12 MED ORDER — SODIUM CHLORIDE 0.9 % IV SOLN
100.0000 mg | Freq: Once | INTRAVENOUS | Status: DC
  Filled 2017-10-12: qty 0.8

## 2017-10-12 MED ORDER — SODIUM CHLORIDE 0.9 % IV SOLN
1000.0000 mg | Freq: Once | INTRAVENOUS | Status: AC
  Administered 2017-10-12: 1000 mg via INTRAVENOUS
  Filled 2017-10-12: qty 100

## 2017-10-12 MED ORDER — SODIUM CHLORIDE 0.9 % IV SOLN: 1000.0000 mg | Freq: Once | INTRAVENOUS | Status: DC

## 2017-10-12 MED ORDER — METHYLPREDNISOLONE SODIUM SUCC 125 MG IJ SOLR
100.0000 mg | Freq: Once | INTRAMUSCULAR | Status: AC
  Administered 2017-10-12: 100 mg via INTRAVENOUS
  Filled 2017-10-12: qty 2

## 2017-10-12 MED ORDER — ACETAMINOPHEN 500 MG PO TABS
1000.0000 mg | ORAL_TABLET | Freq: Once | ORAL | Status: AC
  Administered 2017-10-12: 1000 mg via ORAL
  Filled 2017-10-12: qty 2

## 2017-10-12 NOTE — Patient Instructions (Signed)

## 2017-10-12 NOTE — Progress Notes (Signed)
Patient reports that her children have been sick with head colds.  She reports that she has the sniffles and has been to primary doctor.  She was given a prescription for a UTI and for prednisone for back pain from lumbar puncture - they think she might have a blood patch in that spot.  She hasn't started prescriptions because she wanted to check with Dr. Merlene Pulling first.  Dr. Merlene Pulling consulted and patient is okay to take prescribed medications.  Mask placed on patient while she was in the infusion suite.

## 2017-10-13 ENCOUNTER — Encounter: Payer: Self-pay | Admitting: Neurology

## 2017-10-31 ENCOUNTER — Encounter: Payer: Self-pay | Admitting: Hematology and Oncology

## 2017-12-28 ENCOUNTER — Other Ambulatory Visit: Payer: Self-pay | Admitting: Psychiatry

## 2017-12-28 DIAGNOSIS — R569 Unspecified convulsions: Secondary | ICD-10-CM

## 2018-01-04 ENCOUNTER — Ambulatory Visit: Payer: POS

## 2018-02-01 IMAGING — US US ABDOMEN LIMITED
1 series · 14 of 25 positions shown · non-contrast
Comparison: None.

CLINICAL DATA: Right upper quadrant pain

EXAM:
ULTRASOUND ABDOMEN LIMITED RIGHT UPPER QUADRANT

[Series 1: us abdomen limited · 0.19mm/px · 14 of 56 slices shown]
[im 1/56]
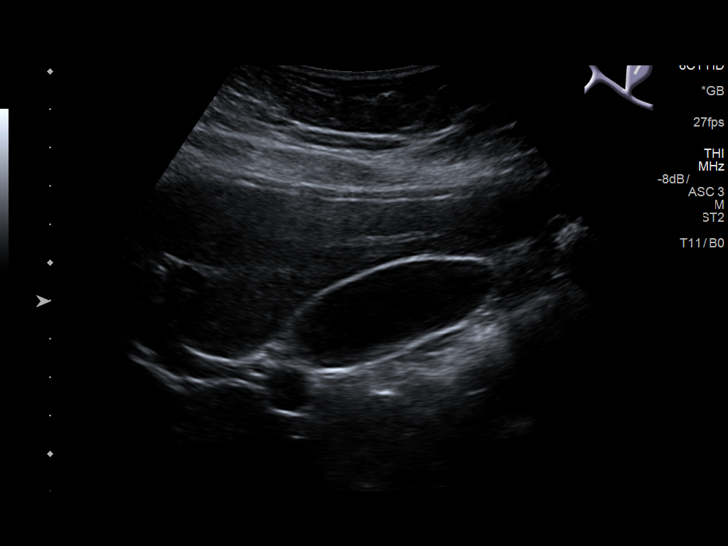
[im 5/56]
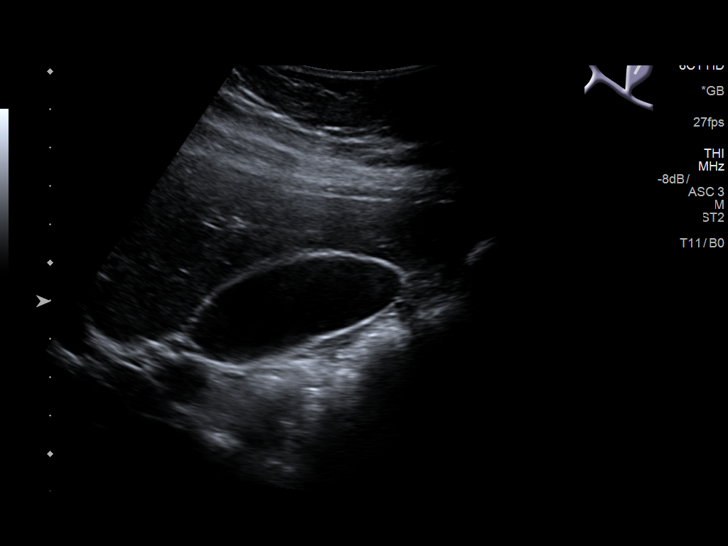
[im 10/56]
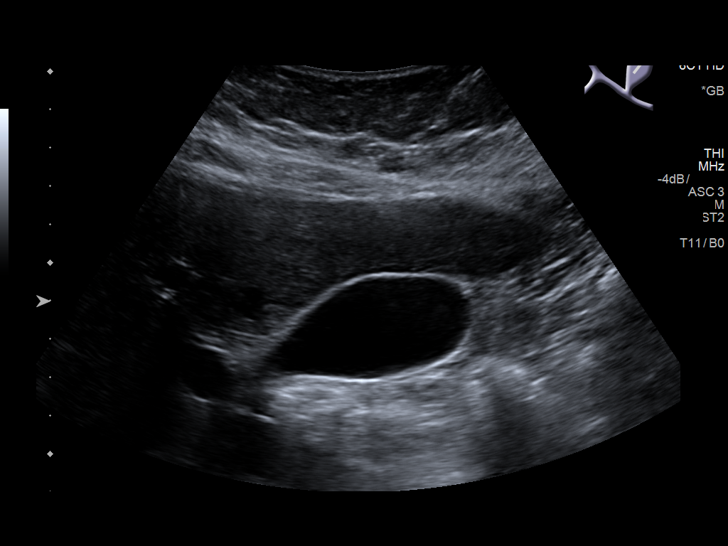
[im 14/56]
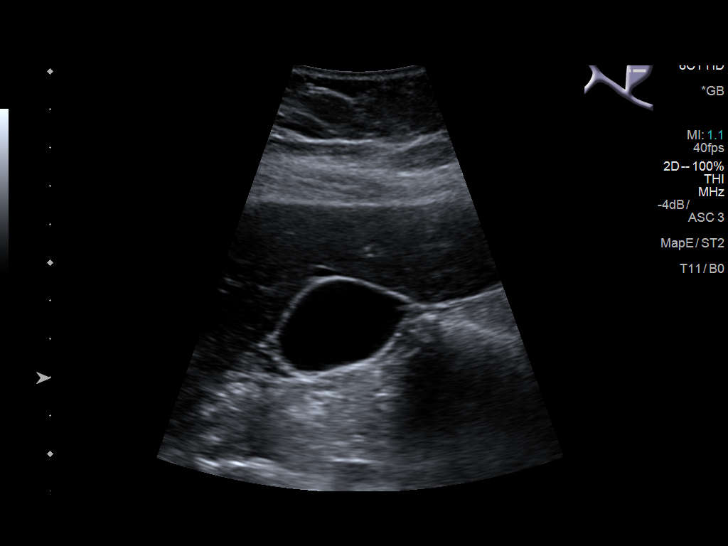
[im 19/56]
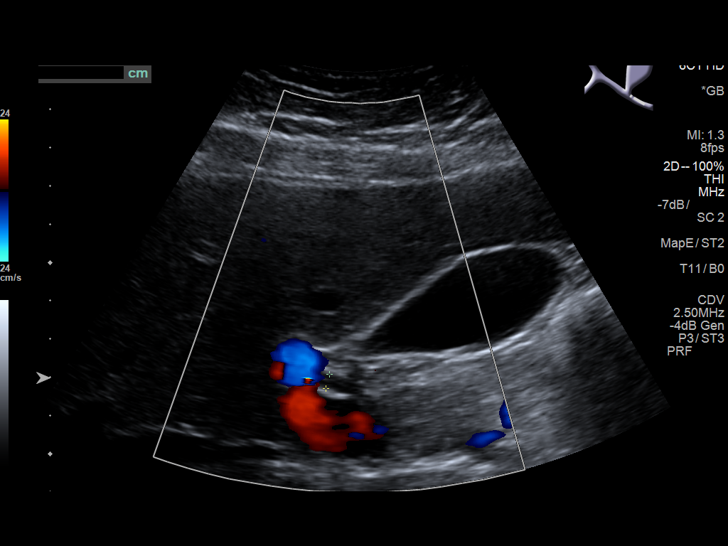
[im 21/56]
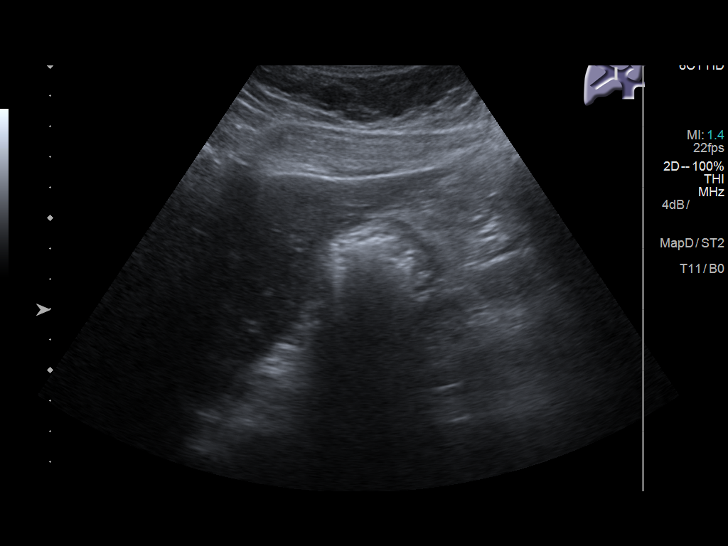
[im 26/56]
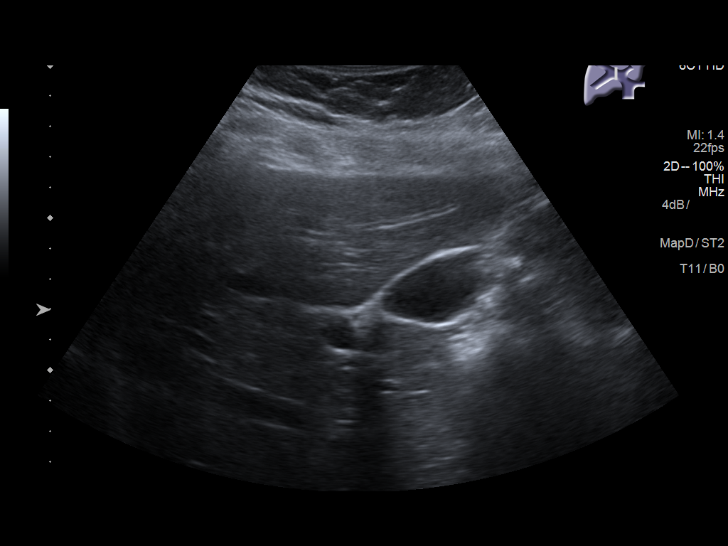
[im 30/56]
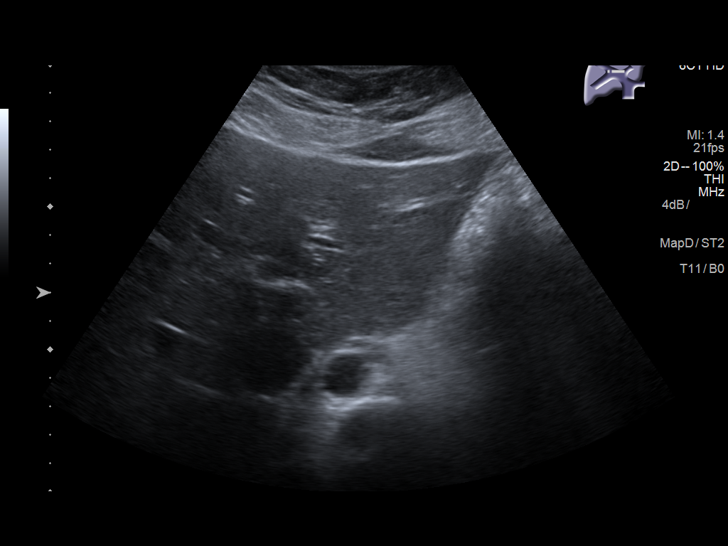
[im 35/56]
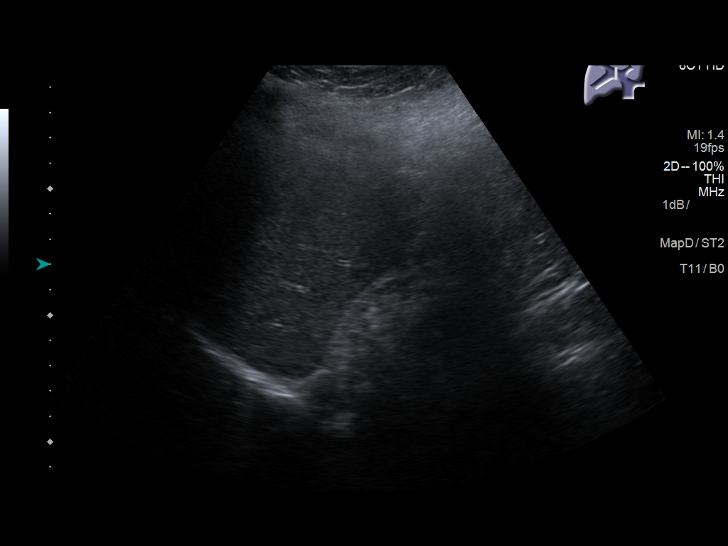
[im 37/56]
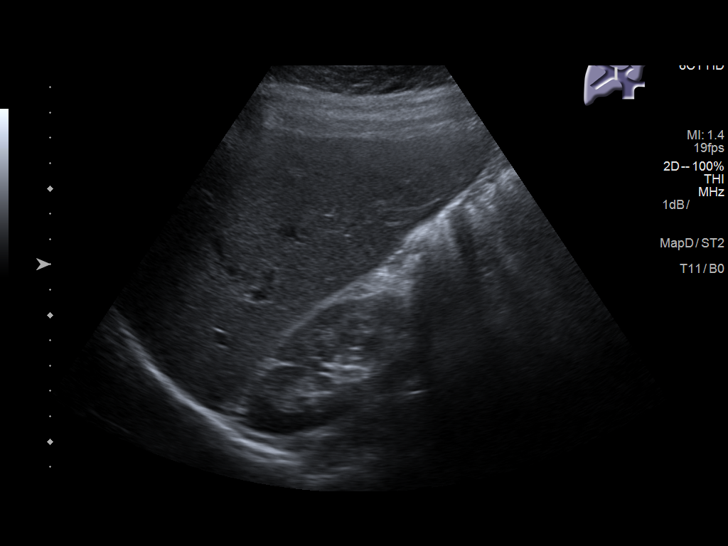
[im 42/56]
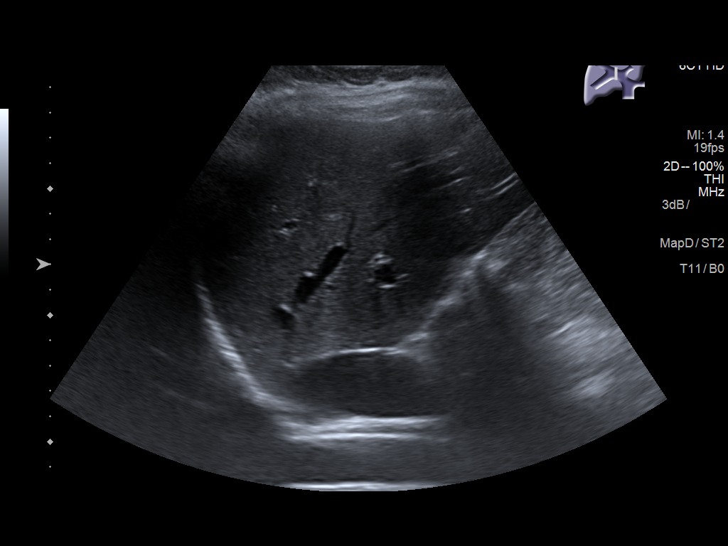
[im 46/56]
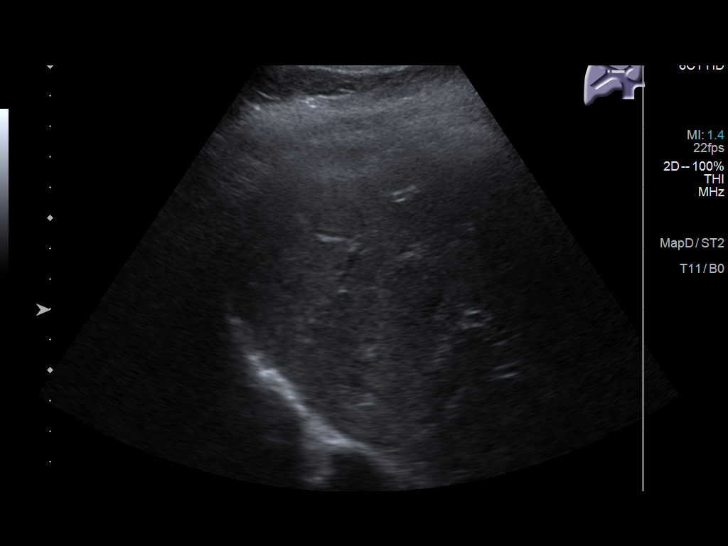
[im 51/56]
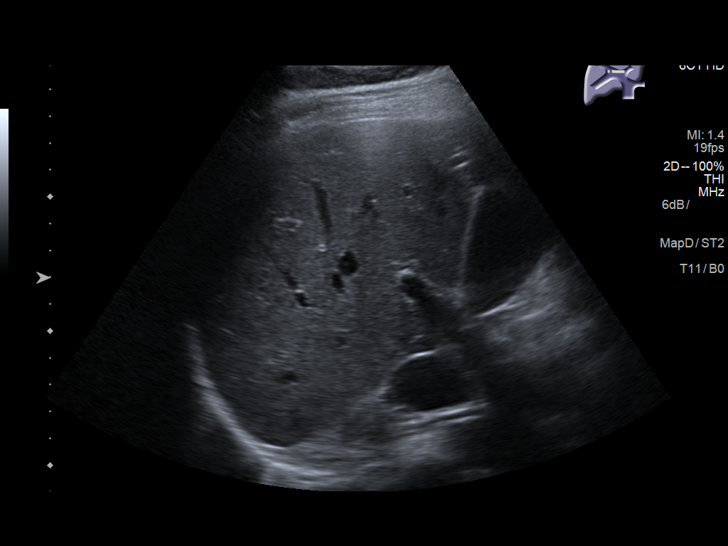
[im 56/56]
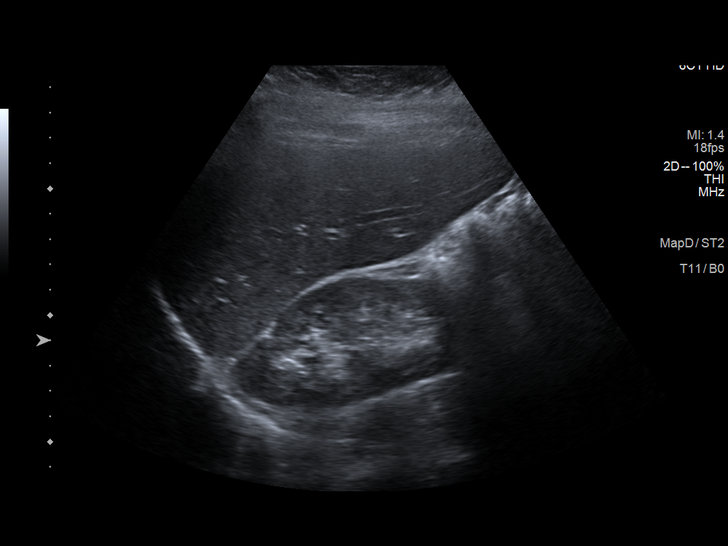

[14 of 25 positions shown; findings below may reference images not displayed]

FINDINGS: Gallbladder:

No gallstones or wall thickening visualized. No sonographic Murphy
sign noted by sonographer.

Common bile duct:

Diameter: Normal caliber, 4 mm

Liver:

No focal lesion identified. Within normal limits in parenchymal
echogenicity. Portal vein is patent on color Doppler imaging with
normal direction of blood flow towards the liver.
IMPRESSION: Normal right upper quadrant ultrasound.

## 2018-03-23 ENCOUNTER — Other Ambulatory Visit: Payer: Self-pay

## 2018-03-23 DIAGNOSIS — G35 Multiple sclerosis: Secondary | ICD-10-CM

## 2018-03-28 NOTE — Progress Notes (Signed)
[facility] day:  03/29/2018  Chief Complaint: Carly Martin is a 33 y.o. female with multiple sclerosis who is seen for assessment prior to cycle #3 Rituxan.   HPI:  The patient was last seen in the medical oncology clinic on 09/21/2017 for initial assessment prior to initiation of Rituxan.  At that time, there were issues regarding JC virus titers and hepatitis serologies.  Dr. Melrose Nakayama reported a 5 in 100,000 risk of PML.  The patient was scheduled for infusion.   Labs included a normal CBC with diff, and CMP. Hepatitis B surface antigen and hepatitis B core antibody were negative.  She received Rituxan on 09/28/2017 and 10/12/2017.  During the interim, patient is doing well today. She denies any focal neurological changes. She states, "I knew that it was time for an infusion. Over the last few weeks, I feel like I have been on a downward spiral".  Weakness in hands and legs is relaxing and remitting. Vertigo symptoms persist intermittently. She has not appreciated any changes to her speech. She has daily headaches that are chronic in nature. Patient recently seen by ophthalmology and has been prescribed glasses.   Patient denies that she has experienced any B symptoms. She denies any interval infections. Patient advises that she maintains an adequate appetite. She is eating well. Weight today is 220 lb 3.8 oz (99.9 kg), which compared to her last visit to the clinic, represents a 7 pound increase.   Patient denies pain in the clinic today.   Past Medical History:  Diagnosis Date  . Asthma     Past Surgical History:  Procedure Laterality Date  . CERVICAL BIOPSY  W/ LOOP ELECTRODE EXCISION  2008  . TONSILLECTOMY      Family History  Problem Relation Age of Onset  . Migraines Sister   . Cancer Maternal Grandfather   . Cancer Paternal Grandfather     Social History:  reports that she has been smoking cigarettes. She has a 2.50  pack-year smoking history. She has never used smokeless tobacco. She reports current alcohol use. She reports that she does not use drugs.  She smokes 5-6 cigarettes/day.  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  She lives in [place].  The patient is alone today.  Allergies:  Allergies  Allergen Reactions  . Codeine Shortness Of Breath    Current Medications: Current Outpatient Medications  Medication Sig Dispense Refill  . etonogestrel (IMPLANON) 68 MG IMPL implant 1 each by Subdermal route once.    Marland Kitchen albuterol (PROVENTIL HFA;VENTOLIN HFA) 108 (90 Base) MCG/ACT inhaler Inhale 1-2 puffs into the lungs every 6 (six) hours as needed for wheezing or shortness of breath. (Patient not taking: Reported on 03/29/2018) 1 Inhaler 0  . buPROPion (WELLBUTRIN XL) 150 MG 24 hr tablet Take 150 mg by mouth daily.  1  . fluticasone (FLONASE) 50 MCG/ACT nasal spray Place 2 sprays into both nostrils daily. (Patient not taking: Reported on 03/29/2018) 16 g 0  . nicotine (NICODERM CQ - DOSED IN MG/24 HOURS) 14 mg/24hr patch Place 1 patch (14 mg total) onto the skin daily. (Patient not taking: Reported on 03/29/2018) 30 patch 0   No current facility-administered medications for this visit.     Review of Systems  Constitutional: Negative for chills, diaphoresis, fever, malaise/fatigue and weight loss (up 7 pounds).       Feels "good".  HENT: Negative.  Negative for congestion, ear discharge, ear pain, nosebleeds, sinus pain and sore throat.   Eyes: Positive for blurred vision (mainly in RIGHT eye and associated with upward gaze). Negative for double vision, photophobia and pain.       Vision better with glasses.  Respiratory: Negative for cough, hemoptysis, sputum production and shortness of breath.        Exercise induced asthma.  Cardiovascular: Negative.  Negative for chest pain, palpitations, orthopnea, leg swelling and PND.   Gastrointestinal: Negative.  Negative for abdominal pain, blood in stool, constipation, diarrhea, heartburn, melena, nausea and vomiting.  Genitourinary: Negative.  Negative for dysuria, frequency, hematuria and urgency.  Musculoskeletal: Negative.  Negative for back pain, falls (no interval falls), joint pain, myalgias and neck pain.  Skin: Negative.  Negative for itching and rash.  Neurological: Positive for focal weakness (strength in legs comes and goes) and weakness (generalized). Negative for dizziness, tremors, sensory change, speech change and headaches.       Multiple Sclerosis.  Endo/Heme/Allergies: Negative.   Psychiatric/Behavioral: Negative.  Negative for depression and memory loss. The patient is not nervous/anxious and does not have insomnia.   All other systems reviewed and are negative.  Performance status (ECOG): 1  Vital Signs BP 118/67 (BP Location: Left Arm, Patient Position: Sitting)   Pulse 86   Temp 98.4 F (36.9 C) (Tympanic)   Resp 18   Ht 5' 5" (1.651 m)   Wt 220 lb 3.8 oz (99.9 kg)   SpO2 100%   BMI 36.65 kg/m   Physical Exam  Constitutional: She is oriented to person, place, and time and well-developed, well-nourished, and in no distress. No distress.  HENT:  Head: Normocephalic and atraumatic.  Mouth/Throat: Oropharynx is clear and moist. No oropharyngeal exudate.  Eyes: Pupils are equal, round, and reactive to light. Conjunctivae and EOM are normal. No scleral icterus.  Neck: Normal range of motion. Neck supple. No JVD present.  Cardiovascular: Normal rate, regular rhythm and normal heart sounds. Exam reveals no gallop and no friction rub.  No murmur heard. Pulmonary/Chest: Effort normal and breath sounds normal. No respiratory distress. She has no wheezes. She has no rales.  Abdominal: Soft. Bowel sounds are normal. She exhibits no distension. There is no abdominal tenderness. There is no rebound and no guarding.  Musculoskeletal: Normal range of  motion.        General: No tenderness or edema.  Lymphadenopathy:    She has no cervical adenopathy.    She has no axillary adenopathy.       Right: No inguinal and no supraclavicular adenopathy present.       Left: No inguinal and no supraclavicular adenopathy present.  Neurological: She is alert and oriented to person, place, and time. She has normal sensation, normal strength and intact cranial nerves. She displays normal speech. No cranial nerve deficit or sensory deficit. She has a normal Finger-Nose-Finger Test. Gait normal. Coordination and gait normal.  Skin: Skin is warm and dry. No rash noted. She is not diaphoretic. No erythema. No pallor.  Psychiatric: Mood, affect and judgment normal.  Nursing note and vitals reviewed.   No visits with results within 3 Day(s) from this visit.  Latest known visit with results is:  Admission on 07/03/2017, Discharged on 07/05/2017  Component Date Value Ref Range Status  . WBC 07/03/2017 7.4  3.6 - 11.0 K/uL Final  . RBC 07/03/2017 4.20  3.80 - 5.20 MIL/uL Final  . Hemoglobin 07/03/2017 12.3  12.0 - 16.0 g/dL Final  . HCT 07/03/2017 37.3  35.0 - 47.0 % Final  . MCV 07/03/2017 88.8  80.0 - 100.0 fL Final  . MCH 07/03/2017 29.3  26.0 - 34.0 pg Final  . MCHC 07/03/2017 32.9  32.0 - 36.0 g/dL Final  . RDW 07/03/2017 14.3  11.5 - 14.5 % Final  . Platelets 07/03/2017 290  150 - 440 K/uL Final  . Neutrophils Relative % 07/03/2017 59  % Final  . Neutro Abs 07/03/2017 4.4  1.4 - 6.5 K/uL Final  . Lymphocytes Relative 07/03/2017 32  % Final  . Lymphs Abs 07/03/2017 2.3  1.0 - 3.6 K/uL Final  . Monocytes Relative 07/03/2017 6  % Final  . Monocytes Absolute 07/03/2017 0.5  0.2 - 0.9 K/uL Final  . Eosinophils Relative 07/03/2017 2  % Final  . Eosinophils Absolute 07/03/2017 0.1  0 - 0.7 K/uL Final  . Basophils Relative 07/03/2017 1  % Final  . Basophils Absolute 07/03/2017 0.1  0 - 0.1 K/uL Final   Performed at [facility], [address] [place], [place] [address]  . Sodium 07/03/2017 136  135 - 145 mmol/L Final  . Potassium 07/03/2017 3.9  3.5 - 5.1 mmol/L Final  . Chloride 07/03/2017 105  101 - 111 mmol/L Final  . CO2 07/03/2017 21* 22 - 32 mmol/L Final  . Glucose, Bld 07/03/2017 103* 65 - 99 mg/dL Final  . BUN 07/03/2017 15  6 - 20 mg/dL Final  . Creatinine, Ser 07/03/2017 0.68  0.44 - 1.00 mg/dL Final  . Calcium 07/03/2017 8.9  8.9 - 10.3 mg/dL Final  . Total Protein 07/03/2017 7.7  6.5 - 8.1 g/dL Final  . Albumin 07/03/2017 4.1  3.5 - 5.0 g/dL Final  . AST 07/03/2017 19  15 - 41 U/L Final  . ALT 07/03/2017 15  14 - 54 U/L Final  . Alkaline Phosphatase 07/03/2017 49  38 - 126 U/L Final  . Total Bilirubin 07/03/2017 0.8  0.3 - 1.2 mg/dL Final  . GFR calc non Af Amer 07/03/2017 >60  >60 mL/min Final  . GFR calc Af Amer 07/03/2017 >60  >60 mL/min Final   Comment: (NOTE) The eGFR has been calculated using the CKD EPI equation. This calculation has not been validated in all clinical situations. eGFR's persistently <60 mL/min signify possible Chronic Kidney Disease.   Georgiann Hahn gap 07/03/2017 10  5 - 15 Final   Performed at [facility], [address] [place], [place] [address]  . Magnesium 07/03/2017 1.8  1.7 - 2.4 mg/dL Final   Performed at [facility], [address] [place], [place] [address]  . Color, Urine 07/04/2017 YELLOW* YELLOW Final  . APPearance 07/04/2017 CLOUDY* CLEAR Final  . Specific Gravity, Urine 07/04/2017 1.013  1.005 - 1.030 Final  . pH 07/04/2017 5.0  5.0 - 8.0 Final  . Glucose, UA 07/04/2017 >=500* NEGATIVE mg/dL Final  . Hgb urine dipstick 07/04/2017 NEGATIVE  NEGATIVE Final  . Bilirubin Urine 07/04/2017 NEGATIVE  NEGATIVE Final  . Ketones, ur 07/04/2017 NEGATIVE  NEGATIVE mg/dL Final  . Protein, ur 07/04/2017 NEGATIVE  NEGATIVE mg/dL Final  . Nitrite 07/04/2017 NEGATIVE  NEGATIVE Final  . Leukocytes, UA 07/04/2017 NEGATIVE  NEGATIVE Final  . RBC / HPF 07/04/2017  0-5  0 - 5 RBC/hpf Final  . WBC, UA 07/04/2017 0-5  0 - 5 WBC/hpf Final  . Bacteria, UA 07/04/2017 NONE SEEN  NONE SEEN Final  . Squamous  Epithelial / LPF 07/04/2017 6-10  0 - 5 Final  . Mucus 07/04/2017 PRESENT   Final   Performed at [facility], [address] [place], [place] [address]  . HIV Screen 4th Generation wRfx 07/04/2017 Non Reactive  Non Reactive Final   Comment: (NOTE) Performed At: [facility] [place], [place] [id number] Rush Farmer MD [id number] Performed at [facility], [address] [place], [place] [address]   . Sodium 07/04/2017 134* 135 - 145 mmol/L Final  . Potassium 07/04/2017 4.0  3.5 - 5.1 mmol/L Final  . Chloride 07/04/2017 107  101 - 111 mmol/L Final  . CO2 07/04/2017 20* 22 - 32 mmol/L Final  . Glucose, Bld 07/04/2017 151* 65 - 99 mg/dL Final  . BUN 07/04/2017 14  6 - 20 mg/dL Final  . Creatinine, Ser 07/04/2017 0.64  0.44 - 1.00 mg/dL Final  . Calcium 07/04/2017 9.0  8.9 - 10.3 mg/dL Final  . GFR calc non Af Amer 07/04/2017 >60  >60 mL/min Final  . GFR calc Af Amer 07/04/2017 >60  >60 mL/min Final   Comment: (NOTE) The eGFR has been calculated using the CKD EPI equation. This calculation has not been validated in all clinical situations. eGFR's persistently <60 mL/min signify possible Chronic Kidney Disease.   Georgiann Hahn gap 07/04/2017 7  5 - 15 Final   Performed at [facility], [address] [place], [place] [address]  . WBC 07/04/2017 7.6  3.6 - 11.0 K/uL Final  . RBC 07/04/2017 4.29  3.80 - 5.20 MIL/uL Final  . Hemoglobin 07/04/2017 12.4  12.0 - 16.0 g/dL Final  . HCT 07/04/2017 38.2  35.0 - 47.0 % Final  . MCV 07/04/2017 89.1  80.0 - 100.0 fL Final  . MCH 07/04/2017 29.0  26.0 - 34.0 pg Final  . MCHC 07/04/2017 32.6  32.0 - 36.0 g/dL Final  . RDW 07/04/2017 14.3  11.5 - 14.5 % Final  . Platelets 07/04/2017 286  150 - 440 K/uL Final   Performed at [facility], [address] [place], [place] [address]  . Prothrombin Time 07/04/2017 14.1  11.4 - 15.2 seconds Final  . INR 07/04/2017 1.10   Final   Performed at [facility], [address] [place], [place] [address]  . CYTOLOGY - NON GYN 07/04/2017    Final                   Value:Cytology - Non PAP CASE: [id number] PATIENT: Carly Martin Non-Gyn Cytology Report     SPECIMEN SUBMITTED: A. CSF  CLINICAL HISTORY: None Provided  PRE-OPERATIVE DIAGNOSIS: Lumbar puncture performed due to concern for multiple sclerosis  POST-OPERATIVE DIAGNOSIS: None provided.     DIAGNOSIS: A. CEREBROSPINAL FLUID; LUMBAR PUNCTURE: - NEGATIVE FOR MALIGNANCY. - LYMPHOCYTES AND A RARE MACROPHAGE IN A VERY LOW CELLULARITY SAMPLE (2 WBC PER CUBIC MM).  Comment: Slides reviewed: 1 cytospin slide, 1 ThinPrep slide, and 1 cell block   GROSS DESCRIPTION: A. Labeled: Medical record number and patient name Received: Fresh Volume: 1 mL Description: Clear fluid in an 8 mL container Submitted for ThinPrep and one cell block    Final Diagnosis performed by Bryan Lemma, MD.   Electronically signed 07/06/2017 6:17:06PM The electronic signature indicates that the named Attending Pathologist has evaluated the specimen  Technical component performed at [facility], [address] [place], [place] [address] Lab: [phone] Dir: Rush Farmer, MD, MMM  Professional component performed at [facility], [address], [place], [place] [address] Lab: [phone] Dir: Dellia Nims. Rubinas, MD   . Glucose, CSF 07/04/2017 87* 40 - 70 mg/dL Final   Performed at [facility], [address] [place], [place] [address]  . Total  Protein, CSF 07/04/2017 18  15 - 45 mg/dL Final   Performed at [facility], [address] [place], [place] [address]  . Tube # 07/04/2017 3   Final  . Color, CSF 07/04/2017 COLORLESS  COLORLESS Final  .  Appearance, CSF 07/04/2017 CLEAR  CLEAR Final  . RBC Count, CSF 07/04/2017 20* 0 - 3 /cu mm Final  . WBC, CSF 07/04/2017 2  0 - 5 /cu mm Final  . Segmented Neutrophils-CSF 07/04/2017 0  % Final  . Lymphs, CSF 07/04/2017 95  % Final  . Monocyte-Macrophage-Spinal Fluid 07/04/2017 5  % Final  . Eosinophils, CSF 07/04/2017 0  % Final  . Other Cells, CSF 07/04/2017 0   Final   Performed at [facility], [address], [place], [place] [address]  . Specimen Description 07/04/2017    Final                   Value:CSF Performed at [facility], [address] [place], [place] [address]   . Special Requests 07/04/2017    Final                   Value:NONE Performed at [facility], [address] [place], [place] [address]   . Culture 07/04/2017    Final                   Value:NO ANAEROBES ISOLATED Performed at [facility], [place] [address] [place], [place] [address]   . Report Status 07/04/2017 07/09/2017 FINAL   Final  . Specimen Description 07/04/2017    Final                   Value:CSF Performed at [facility], [address] [place], [place] [address]   . Special Requests 07/04/2017    Final                   Value:NONE Performed at [facility], [address] [place], [place] [address]   . Gram Stain 07/04/2017    Final                   Value:NO ORGANISMS SEEN FEW WBC SEEN Performed at [facility], [address] [place], [place] [address]   . Culture 07/04/2017    Final                   Value:NO GROWTH 3 DAYS Performed at [facility], [place] [address], [place], [place] [address]   . Report Status 07/04/2017 07/08/2017 FINAL   Final  . Fungus Stain 07/04/2017 Final report   Final  . Fungus (Mycology) Culture 07/04/2017 Final report   Final   Comment: (NOTE) Performed At: [facility] [place], [place] [id number] Rush Farmer MD [id number]   . Fungal Source  07/04/2017 CSF   Final   Performed at [facility], [address] [place],  [address]  . CSF Oligoclonal Bands 07/04/2017 Comment   Final  Comment: (NOTE) Eight (8) oligoclonal bands were observed in the CSF, which were not detected in the serum sample. Interpretation: Criteria for Positivity: Four (4) or more oligoclonal bands observed  only in the CSF have been shown to be most consistent with MS  using our method. [Fortini AS, Sanders EL, Weinshenker BG, and Katzmann JA: Cerebrospinal Fluid Oligoclonal Bands in the Diagnosis  of Multiple Sclerosis. Am J Clin Pathol 120(5):672-675, 2003]. Oligoclonal bands that are present only in the CSF have been associated with a variety of inflammatory brain diseases such as multiple sclerosis (MS), subacute encephalitis, neurosyphilis, etc.  Increased IgG in the CSF is not specific for MS, but is an indication  of chronic neural inflammation. Clinical correlation indicated. Approximately 2-3% of clinically confirmed MS patients show little or no evidence of oligoclonal bands in the CSF; however oligoclonal  bands may develop as the disease progresses. Oligoclonal Banding                           testing performed using Isoelectric Focusing (IEF) and immunoblotting methodology. Performed At: [facility] [place], [place] [id number] Rush Farmer MD [id number] Performed at [facility], [address] [place], [place] [address]   . IgG, CSF 07/04/2017 4.1  0.0 - 8.6 mg/dL Final  . Albumin CSF-mCnc 07/04/2017 9* 11 - 48 mg/dL Final  . IgG (Immunoglobin G), Serum 07/04/2017 1,321  700 - 1,600 mg/dL Final  . Albumin 07/04/2017 4.3  3.5 - 5.5 g/dL Final  . IgG/Alb Ratio, CSF 07/04/2017 0.46* 0.00 - 0.25 Final  . CSF IgG Index 07/04/2017 1.5* 0.0 - 0.7 Final   Comment: (NOTE) Performed At: [facility] [place], [place] [id number] Rush Farmer MD  [id number] Performed at [facility], [address] [place], [place] [address]   . Myelin Basic Protein 07/04/2017 1.4* 0.0 - 1.2 ng/mL Final   Comment: (NOTE) Results for this test are for research purposes only by the assay's manufacturer.  The performance characteristics of this product have not been established.  Results should not be used as a diagnostic procedure without confirmation of the diagnosis by another medically established diagnostic product or procedure. Performed At: [facility] [place], [place] [id number] Rush Farmer MD [id number] Performed at [facility], [address] [place], [place] [address]   . Result 1 07/04/2017 Comment   Final   Comment: (NOTE) KOH/Calcofluor preparation:  no fungus observed. Performed At: [facility] [place], [place] [id number] Rush Farmer MD [id number] Performed at [facility], [address] [place], [place] [address]   . Fungal result 1 07/04/2017 Comment   Final   Comment: (NOTE) No yeast or mold isolated after 4 weeks. Performed At: [facility] [place], [place] [id number] Rush Farmer MD [id number] Performed at [facility], [address] [place], [place] [address]     Assessment:  Carly Martin is a 33 y.o. female with multiple sclerosis.  She was diagnosed on 07/04/2017.    Patient was admitted to [facility] from 07/03/2017 - 07/05/2017 with left leg tingling and numbness.  She received high-dose steroids x 3 days with resolution of symptoms.    LP on 07/04/2017 revealed eight oligoclonal bands were observed in the CSF.  MRI cervical and thoracic spine on 07/03/2017 revealed no evidence of a demyelinating disease.  Head MRI on 07/03/2017 revealed scattered foci of  abnormal T2 and FLAIR signal in the frontal deep and subcortical white matter and in the parietal cortical and subcortical  brain.  She has + MOG testing.  Labs on 07/18/2017 revealed: Hepatitis C antibody < 0.1.  Hepatitis B surface antibody 7.4 (immunity > 9.9).  Hepatitis B core antibody total negative.  Quantiferon gold negative.  Lyme IgG/IgM < 0.91 and IgM quantitative 1.08 (0-0.79).  JC virus antibody index was 3.01 (positive).  Hepatitis B surface antigen and hepatitis B core antibody were negative on 09/21/2017.  She received Rituxan on 09/28/2017 and 10/12/2017.  Symptomatically, she denies any new or acute symptoms related to her MS.  She has intermittent weakness in her upper and lower extremities.  She has had no interval falls.  Patient recently prescribed corrective lenses (glasses) for her vision.  Exam is grossly stable.   Plan: 1. Labs:  CBC with diff, CMP. 2. Multiple sclerosis Doing well overall.  No signs of progression since last evaluation. No interval falls. Continue follow-up as scheduled with neurology. Rituxan 500 mg I today. 3. RTC in 6 months for MD assessment, labs (CBC with diff, CMP), and cycle #4 Rituxan.   Honor Loh, NP  03/29/2018, 8:55 AM   I saw and evaluated the patient, participating in the key portions of the service and reviewing pertinent diagnostic studies and records.  I reviewed the nurse practitioner's note and agree with the findings and the plan.  The assessment and plan were discussed with the patient.  Several questions were asked by the patient and answered.   Nolon Stalls, MD 03/29/2018,8:55 AM

## 2018-03-29 ENCOUNTER — Inpatient Hospital Stay: Payer: POS

## 2018-03-29 ENCOUNTER — Other Ambulatory Visit: Payer: POS

## 2018-03-29 ENCOUNTER — Encounter: Payer: Self-pay | Admitting: Hematology and Oncology

## 2018-03-29 ENCOUNTER — Inpatient Hospital Stay: Payer: POS | Attending: Hematology and Oncology | Admitting: Hematology and Oncology

## 2018-03-29 VITALS — BP 118/67 | HR 86 | Temp 98.4°F | Resp 18 | Ht 65.0 in | Wt 220.2 lb

## 2018-03-29 VITALS — BP 118/76 | HR 80 | Resp 18

## 2018-03-29 DIAGNOSIS — G35 Multiple sclerosis: Secondary | ICD-10-CM

## 2018-03-29 DIAGNOSIS — Z5112 Encounter for antineoplastic immunotherapy: Secondary | ICD-10-CM | POA: Diagnosis present

## 2018-03-29 LAB — COMPREHENSIVE METABOLIC PANEL
ALT: 15 U/L (ref 0–44)
AST: 16 U/L (ref 15–41)
Albumin: 4.1 g/dL (ref 3.5–5.0)
Alkaline Phosphatase: 44 U/L (ref 38–126)
Anion gap: 7 (ref 5–15)
BUN: 13 mg/dL (ref 6–20)
CO2: 25 mmol/L (ref 22–32)
Calcium: 8.6 mg/dL — ABNORMAL LOW (ref 8.9–10.3)
Chloride: 105 mmol/L (ref 98–111)
Creatinine, Ser: 0.64 mg/dL (ref 0.44–1.00)
GFR calc Af Amer: >60 mL/min (ref >60)
GFR calc non Af Amer: >60 mL/min (ref >60)
Glucose, Bld: 111 mg/dL — ABNORMAL HIGH (ref 70–99)
Potassium: 3.9 mmol/L (ref 3.5–5.1)
Sodium: 137 mmol/L (ref 135–145)
Total Bilirubin: 0.5 mg/dL (ref 0.3–1.2)
Total Protein: 7.3 g/dL (ref 6.5–8.1)

## 2018-03-29 LAB — CBC WITH DIFFERENTIAL/PLATELET
Abs Immature Granulocytes: 0.01 10*3/uL (ref 0.00–0.07)
Basophils Absolute: 0.1 10*3/uL (ref 0.0–0.1)
Basophils Relative: 1 %
Eosinophils Absolute: 0.2 10*3/uL (ref 0.0–0.5)
Eosinophils Relative: 3 %
HCT: 37.6 % (ref 36.0–46.0)
Hemoglobin: 12.2 g/dL (ref 12.0–15.0)
Immature Granulocytes: 0 %
Lymphocytes Relative: 35 %
Lymphs Abs: 1.9 10*3/uL (ref 0.7–4.0)
MCH: 29.8 pg (ref 26.0–34.0)
MCHC: 32.4 g/dL (ref 30.0–36.0)
MCV: 91.9 fL (ref 80.0–100.0)
Monocytes Absolute: 0.3 10*3/uL (ref 0.1–1.0)
Monocytes Relative: 5 %
Neutro Abs: 3.1 10*3/uL (ref 1.7–7.7)
Neutrophils Relative %: 56 %
Platelets: 261 10*3/uL (ref 150–400)
RBC: 4.09 MIL/uL (ref 3.87–5.11)
RDW: 13.1 % (ref 11.5–15.5)
WBC: 5.6 10*3/uL (ref 4.0–10.5)
nRBC: 0 % (ref 0.0–0.2)

## 2018-03-29 MED ORDER — SODIUM CHLORIDE 0.9 % IV SOLN: 500.0000 mg | Freq: Once | INTRAVENOUS | Status: DC

## 2018-03-29 MED ORDER — SODIUM CHLORIDE 0.9 % IV SOLN
500.0000 mg | Freq: Once | INTRAVENOUS | Status: AC
  Administered 2018-03-29: 500 mg via INTRAVENOUS
  Filled 2018-03-29: qty 50

## 2018-03-29 MED ORDER — DIPHENHYDRAMINE HCL 25 MG PO CAPS
50.0000 mg | ORAL_CAPSULE | Freq: Once | ORAL | Status: AC
  Administered 2018-03-29: 50 mg via ORAL
  Filled 2018-03-29: qty 2

## 2018-03-29 MED ORDER — METHYLPREDNISOLONE SODIUM SUCC 125 MG IJ SOLR
100.0000 mg | Freq: Once | INTRAMUSCULAR | Status: AC
  Administered 2018-03-29: 100 mg via INTRAVENOUS
  Filled 2018-03-29: qty 2

## 2018-03-29 MED ORDER — SODIUM CHLORIDE 0.9 % IV SOLN
Freq: Once | INTRAVENOUS | Status: AC
  Administered 2018-03-29: 09:00:00 via INTRAVENOUS
  Filled 2018-03-29: qty 250

## 2018-03-29 MED ORDER — ACETAMINOPHEN 500 MG PO TABS
1000.0000 mg | ORAL_TABLET | Freq: Once | ORAL | Status: AC
  Administered 2018-03-29: 1000 mg via ORAL
  Filled 2018-03-29: qty 2

## 2018-03-29 MED ORDER — SODIUM CHLORIDE 0.9 % IV SOLN
100.0000 mg | Freq: Once | INTRAVENOUS | Status: DC
  Filled 2018-03-29: qty 0.8

## 2018-03-29 NOTE — Patient Instructions (Signed)
Rituximab injection  What is this medicine?  RITUXIMAB (ri TUX i mab) is a monoclonal antibody. It is used to treat certain types of cancer like non-Hodgkin lymphoma and chronic lymphocytic leukemia. It is also used to treat rheumatoid arthritis, granulomatosis with polyangiitis (or Wegener's granulomatosis), microscopic polyangiitis, and pemphigus vulgaris.  This medicine may be used for other purposes; ask your health care provider or pharmacist if you have questions.  COMMON BRAND NAME(S): Rituxan  What should I tell my health care provider before I take this medicine?  They need to know if you have any of these conditions:  -heart disease  -infection (especially a virus infection such as hepatitis B, chickenpox, cold sores, or herpes)  -immune system problems  -irregular heartbeat  -kidney disease  -low blood counts, like low white cell, platelet, or red cell counts  -lung or breathing disease, like asthma  -recently received or scheduled to receive a vaccine  -an unusual or allergic reaction to rituximab, other medicines, foods, dyes, or preservatives  -pregnant or trying to get pregnant  -breast-feeding  How should I use this medicine?  This medicine is for infusion into a vein. It is administered in a hospital or clinic by a specially trained health care professional.  A special MedGuide will be given to you by the pharmacist with each prescription and refill. Be sure to read this information carefully each time.  Talk to your pediatrician regarding the use of this medicine in children. This medicine is not approved for use in children.  Overdosage: If you think you have taken too much of this medicine contact a poison control center or emergency room at once.  NOTE: This medicine is only for you. Do not share this medicine with others.  What if I miss a dose?  It is important not to miss a dose. Call your doctor or health care professional if you are unable to keep an appointment.  What may interact with  this medicine?  -cisplatin  -live virus vaccines  This list may not describe all possible interactions. Give your health care provider a list of all the medicines, herbs, non-prescription drugs, or dietary supplements you use. Also tell them if you smoke, drink alcohol, or use illegal drugs. Some items may interact with your medicine.  What should I watch for while using this medicine?  Your condition will be monitored carefully while you are receiving this medicine. You may need blood work done while you are taking this medicine.  This medicine can cause serious allergic reactions. To reduce your risk you may need to take medicine before treatment with this medicine. Take your medicine as directed.  In some patients, this medicine may cause a serious brain infection that may cause death. If you have any problems seeing, thinking, speaking, walking, or standing, tell your healthcare professional right away. If you cannot reach your healthcare professional, urgently seek other source of medical care.  Call your doctor or health care professional for advice if you get a fever, chills or sore throat, or other symptoms of a cold or flu. Do not treat yourself. This drug decreases your body's ability to fight infections. Try to avoid being around people who are sick.  Do not become pregnant while taking this medicine or for 12 months after stopping it. Women should inform their doctor if they wish to become pregnant or think they might be pregnant. There is a potential for serious side effects to an unborn child. Talk to your health   care professional or pharmacist for more information. Do not breast-feed an infant while taking this medicine or for 6 months after stopping it.  What side effects may I notice from receiving this medicine?  Side effects that you should report to your doctor or health care professional as soon as possible:  -allergic reactions like skin rash, itching or hives; swelling of the face, lips, or  tongue  -breathing problems  -chest pain  -changes in vision  -diarrhea  -headache with fever, neck stiffness, sensitivity to light, nausea, or confusion  -fast, irregular heartbeat  -loss of memory  -low blood counts - this medicine may decrease the number of white blood cells, red blood cells and platelets. You may be at increased risk for infections and bleeding.  -mouth sores  -problems with balance, talking, or walking  -redness, blistering, peeling or loosening of the skin, including inside the mouth  -signs of infection - fever or chills, cough, sore throat, pain or difficulty passing urine  -signs and symptoms of kidney injury like trouble passing urine or change in the amount of urine  -signs and symptoms of liver injury like dark yellow or brown urine; general ill feeling or flu-like symptoms; light-colored stools; loss of appetite; nausea; right upper belly pain; unusually weak or tired; yellowing of the eyes or skin  -signs and symptoms of low blood pressure like dizziness; feeling faint or lightheaded, falls; unusually weak or tired  -stomach pain  -swelling of the ankles, feet, hands  -unusual bleeding or bruising  -vomiting  Side effects that usually do not require medical attention (report to your doctor or health care professional if they continue or are bothersome):  -headache  -joint pain  -muscle cramps or muscle pain  -nausea  -tiredness  This list may not describe all possible side effects. Call your doctor for medical advice about side effects. You may report side effects to FDA at 1-800-FDA-1088.  Where should I keep my medicine?  This drug is given in a hospital or clinic and will not be stored at home.  NOTE: This sheet is a summary. It may not cover all possible information. If you have questions about this medicine, talk to your doctor, pharmacist, or health care provider.   2019 Elsevier/Gold Standard (2016-12-31 13:04:32)

## 2018-03-29 NOTE — Progress Notes (Signed)
No new changes noted today 

## 2018-04-12 ENCOUNTER — Ambulatory Visit: Payer: POS

## 2018-04-15 ENCOUNTER — Encounter: Payer: Self-pay | Admitting: Internal Medicine

## 2018-04-16 ENCOUNTER — Other Ambulatory Visit: Payer: Self-pay | Admitting: Internal Medicine

## 2018-04-16 DIAGNOSIS — M5431 Sciatica, right side: Secondary | ICD-10-CM

## 2018-04-16 MED ORDER — IBUPROFEN 800 MG PO TABS: 800.0000 mg | ORAL_TABLET | Freq: Three times a day (TID) | ORAL | 3 refills | Status: DC | PRN

## 2018-04-16 MED ORDER — ALBUTEROL SULFATE 108 (90 BASE) MCG/ACT IN AEPB: 2.0000 | INHALATION_SPRAY | Freq: Four times a day (QID) | RESPIRATORY_TRACT | 5 refills | Status: DC | PRN

## 2018-05-10 ENCOUNTER — Ambulatory Visit: Payer: POS

## 2018-05-10 ENCOUNTER — Other Ambulatory Visit: Payer: POS

## 2018-05-10 ENCOUNTER — Ambulatory Visit: Payer: POS | Admitting: Hematology and Oncology

## 2018-05-24 ENCOUNTER — Ambulatory Visit: Payer: POS

## 2018-07-19 ENCOUNTER — Encounter: Payer: Self-pay | Admitting: Internal Medicine

## 2018-07-26 ENCOUNTER — Ambulatory Visit: Payer: POS | Admitting: Internal Medicine

## 2018-07-26 ENCOUNTER — Encounter: Payer: Self-pay | Admitting: Internal Medicine

## 2018-07-26 ENCOUNTER — Other Ambulatory Visit: Payer: Self-pay

## 2018-07-26 VITALS — BP 112/68 | HR 78 | Ht 65.0 in | Wt 219.6 lb

## 2018-07-26 DIAGNOSIS — B9689 Other specified bacterial agents as the cause of diseases classified elsewhere: Secondary | ICD-10-CM

## 2018-07-26 DIAGNOSIS — N3000 Acute cystitis without hematuria: Secondary | ICD-10-CM

## 2018-07-26 DIAGNOSIS — N760 Acute vaginitis: Secondary | ICD-10-CM

## 2018-07-26 LAB — POC URINALYSIS WITH MICROSCOPIC (NON AUTO)MANUAL RESULT
Bilirubin, UA: NEGATIVE
Blood, UA: NEGATIVE
Crystals: 0
Epithelial cells, urine per micros: 0
Glucose, UA: NEGATIVE
Ketones, UA: NEGATIVE
Mucus, UA: 0
Nitrite, UA: POSITIVE
Protein, UA: NEGATIVE
RBC: 15 M/uL — AB (ref 4.04–5.48)
Spec Grav, UA: 1.02 (ref 1.010–1.025)
Urobilinogen, UA: 0.2 E.U./dL
WBC Casts, UA: 0
pH, UA: 6 (ref 5.0–8.0)

## 2018-07-26 LAB — POCT WET PREP WITH KOH
KOH Prep POC: NEGATIVE
RBC Wet Prep HPF POC: NEGATIVE
Trichomonas, UA: NEGATIVE
Yeast Wet Prep HPF POC: NEGATIVE

## 2018-07-26 MED ORDER — NITROFURANTOIN MONOHYD MACRO 100 MG PO CAPS: 100.0000 mg | ORAL_CAPSULE | Freq: Two times a day (BID) | ORAL | 0 refills | Status: AC

## 2018-07-26 NOTE — Progress Notes (Signed)
Date:  07/26/2018   Name:  Carly Martin   DOB:  [birth year]   MRN:  [id number]   Chief Complaint: Urinary Tract Infection (Pressure, and cloudy foul smelling urine. Started 2 months ago. ) and vaginal odor  Dysuria  This is a new problem. The current episode started more than 1 month ago. The problem has been waxing and waning. The quality of the pain is described as aching. The pain is mild. There has been no fever. Associated symptoms include frequency and urgency. Pertinent negatives include no chills, nausea or vomiting. Associated symptoms comments: Urine odor. She has tried increased fluids for the symptoms. The treatment provided no relief.  Vaginal Discharge The patient's primary symptoms include vaginal discharge. This is a recurrent problem. The patient is experiencing no pain. She is not pregnant. Associated symptoms include dysuria, frequency and urgency. Pertinent negatives include no chills, diarrhea, fever, headaches, nausea or vomiting. The vaginal discharge was clear, malodorous, white and scant. Treatments tried: boric acid suppositories. The treatment provided significant (currently having no symptoms) relief.  She has noticed that she gets UTI sx after her Rituximab infusions.  Review of Systems  Constitutional: Negative for chills, diaphoresis and fever.  Respiratory: Negative for chest tightness and shortness of breath.   Cardiovascular: Negative for chest pain and palpitations.  Gastrointestinal: Negative for diarrhea, nausea and vomiting.  Genitourinary: Positive for dysuria, frequency, urgency and vaginal discharge.  Musculoskeletal: Negative for arthralgias.  Neurological: Negative for dizziness and headaches.  Psychiatric/Behavioral: Negative for sleep disturbance. The patient is not nervous/anxious.     Patient Active Problem List   Diagnosis Date Noted  . Multiple sclerosis ([place]) 07/03/2017  . Right upper quadrant abdominal pain 09/28/2016  . Irritable  bowel syndrome with constipation 09/28/2016  . Moderate episode of recurrent major depressive disorder ([place]) 09/28/2016  . Lightheadedness 03/03/2016  . Numbness and tingling 09/03/2015  . Chronic tension-type headache, not intractable 09/03/2015  . Sciatica of right side 03/17/2015  . Iron deficiency anemia 03/17/2015  . History of loop electrical excision procedure (LEEP) 09/28/2013    Allergies  Allergen Reactions  . Codeine Shortness Of Breath    Past Surgical History:  Procedure Laterality Date  . CERVICAL BIOPSY  W/ LOOP ELECTRODE EXCISION  2008  . TONSILLECTOMY      Social History   Tobacco Use  . Smoking status: Current Every Day Smoker    Packs/day: 0.25    Years: 10.00    Pack years: 2.50    Types: Cigarettes  . Smokeless tobacco: Never Used  Substance Use Topics  . Alcohol use: Yes    Alcohol/week: 0.0 standard drinks    Comment: socially  . Drug use: No     Medication list has been reviewed and updated.  Current Meds  Medication Sig  . Albuterol Sulfate (PROAIR RESPICLICK) 017 (90 Base) MCG/ACT AEPB Inhale 2 puffs into the lungs 4 (four) times daily as needed.  . Ascorbic Acid (VITAMIN C) 1000 MG tablet Take 1,000 mg by mouth daily.  . cholecalciferol (VITAMIN D3) 25 MCG (1000 UT) tablet Take 400 Units by mouth daily.  Marland Kitchen etonogestrel (IMPLANON) 68 MG IMPL implant 1 each by Subdermal route once.  . fluticasone (FLONASE) 50 MCG/ACT nasal spray Place 2 sprays into both nostrils daily.  Marland Kitchen ibuprofen (ADVIL,MOTRIN) 800 MG tablet Take 1 tablet (800 mg total) by mouth every 8 (eight) hours as needed.  . riTUXimab in sodium chloride 0.9 % 250 mL Inject 1,000 mg into  the vein every 6 (six) months.  Marland Kitchen URINARY HEALTH/CRANBERRY PO Take by mouth.    PHQ 2/9 Scores 07/26/2018 09/23/2017 03/02/2017 09/28/2016  PHQ - 2 Score 0 0 6 6  PHQ- 9 Score 0 6 22 16     BP Readings from Last 3 Encounters:  07/26/18 112/68  03/29/18 118/76  03/29/18 118/67    Physical Exam  Vitals signs and nursing note reviewed.  Constitutional:      Appearance: She is well-developed.  Neck:     Musculoskeletal: Normal range of motion.  Cardiovascular:     Rate and Rhythm: Normal rate and regular rhythm.     Heart sounds: Normal heart sounds. No murmur.  Pulmonary:     Effort: Pulmonary effort is normal. No respiratory distress.     Breath sounds: Normal breath sounds. No wheezing or rhonchi.  Abdominal:     General: Bowel sounds are normal.     Palpations: Abdomen is soft.     Tenderness: There is abdominal tenderness in the suprapubic area. There is no guarding or rebound.  Skin:    General: Skin is warm and dry.  Psychiatric:        Attention and Perception: Attention normal.        Mood and Affect: Mood normal.     Wt Readings from Last 3 Encounters:  07/26/18 219 lb 9.6 oz (99.6 kg)  03/29/18 220 lb 3.8 oz (99.9 kg)  10/11/17 211 lb (95.7 kg)    BP 112/68   Pulse 78   Ht 5\' 5"  (1.651 m)   Wt 219 lb 9.6 oz (99.6 kg)   SpO2 98%   BMI 36.54 kg/m   Assessment and Plan: 1. Acute cystitis without hematuria Increase fluid intake Treat with macrobid x 7d - POC urinalysis w microscopic (non auto) - nitrofurantoin, macrocrystal-monohydrate, (MACROBID) 100 MG capsule; Take 1 capsule (100 mg total) by mouth 2 (two) times daily for 7 days.  Dispense: 14 capsule; Refill: 0  2. BV (bacterial vaginosis) Minimal sx currently Continue boric acid vag suppositories prescribed by GYN - no additional treatment at this time - POCT Wet Prep with KOH   Partially dictated using Animal nutritionist. Any errors are unintentional.  Bari Edward, MD [facility]  07/26/2018

## 2018-09-27 ENCOUNTER — Ambulatory Visit: Payer: POS

## 2018-09-27 ENCOUNTER — Other Ambulatory Visit: Payer: POS

## 2018-09-27 ENCOUNTER — Ambulatory Visit: Payer: POS | Admitting: Hematology and Oncology

## 2018-09-29 ENCOUNTER — Encounter: Payer: Self-pay | Admitting: Pharmacy Technician

## 2018-09-29 NOTE — Progress Notes (Signed)
Patient has been approved for drug assistance by [place] for Rituxan. The enrollment period is from 09/28/18-09/28/19 based on EOOP. First DOS covered is 10/05/2018.

## 2018-10-02 ENCOUNTER — Telehealth: Payer: Self-pay

## 2018-10-02 NOTE — Telephone Encounter (Signed)
Called the office of Dr Melrose Nakayama to see if they had any recent Hep B, Hep B core, Hep C results. I was waiting on Loma Sousa to return my or fax over recent labs. I will reach back out later today if I haven't received recent labs

## 2018-10-03 NOTE — Progress Notes (Signed)
[facility]  [address], [address] [place], [place] [address] Phone: [phone]  Fax: [phone]   Clinic Day:  10/05/2018  Referring physician: Reubin Milan, MD  Chief Complaint: Carly Martin is a 33 y.o. female with multiple sclerosis who is seen for 6 month assessment prior to cycle #4 Rituxan.   HPI: The patient was last seen in the medical oncology clinic on 03/29/2018.  At that time, she denied any new or acute symptoms related to her MS. She had intermittent weakness in her upper and lower extremities. She had no interval falls. She had recently been prescribed corrective lenses (glasses) for her vision. Exam was grossly stable. CBC was normal. Calcium was 8.6. She received Rituxan.   She had a phone visit with Dr. Desma Mcgregor on 06/05/2018 with blurred vision in the right eye, fatigue, right hand weakness, bladder control problems and "MS hugs".  Plan was to continue Rituxan every 6 months. She was started on Provigil for fatigue.   During the interim, she has felt "good".  She denies any complaints while on Provigil.  She has had sharp shooting pain and tingling in her lower bilateral extremities for 10 days. She notes numbness in her feet.  She denies blurred vision.  She reports eye pain and "pressure building up" in her both eyes.  Strength comes and goes in her legs.  Her "MS hugs" are unchanged.  She denies any bladder issues.  She notes that she has a delay in response to conversations or simple movements.    Past Medical History:  Diagnosis Date  . Asthma     Past Surgical History:  Procedure Laterality Date  . CERVICAL BIOPSY  W/ LOOP ELECTRODE EXCISION  2008  . TONSILLECTOMY      Family History  Problem Relation Age of Onset  . Migraines Sister   . Cancer Maternal Grandfather   . Cancer Paternal Grandfather     Social History:  reports that she has been smoking cigarettes. She has a 2.50 pack-year smoking history. She  has never used smokeless tobacco. She reports current alcohol use. She reports that she does not use drugs. She smokes 5-6 cigarettes/day.  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  She lives in [place]. The patient is alone today.  Allergies:  Allergies  Allergen Reactions  . Codeine Shortness Of Breath    Current Medications: Current Outpatient Medications  Medication Sig Dispense Refill  . Albuterol Sulfate (PROAIR RESPICLICK) 108 (90 Base) MCG/ACT AEPB Inhale 2 puffs into the lungs 4 (four) times daily as needed. 1 each 5  . Ascorbic Acid (VITAMIN C) 1000 MG tablet Take 1,000 mg by mouth daily.    . Cholecalciferol (VITAMIN D3 SUPER STRENGTH) 50 MCG (2000 UT) TABS Take 2 tablets by mouth daily.     Marland Kitchen etonogestrel (IMPLANON) 68 MG IMPL implant 1 each by Subdermal route once.    . fluticasone (FLONASE) 50 MCG/ACT nasal spray Place 2 sprays into both nostrils daily. (Patient taking differently: Place 2 sprays into both nostrils as needed. ) 16 g 0  . ibuprofen (ADVIL,MOTRIN) 800 MG tablet Take 1 tablet (800 mg total) by mouth every 8 (eight) hours as needed. 90 tablet 3  . modafinil (PROVIGIL) 100 MG tablet Take 200 mg by mouth daily.     . Multiple Vitamin (MULTI-VITAMIN) tablet Take 1 tablet by mouth daily.     Marland Kitchen  riTUXimab in sodium chloride 0.9 % 250 mL Inject 1,000 mg into the vein every 6 (six) months.    Marland Kitchen URINARY HEALTH/CRANBERRY PO Take 1 tablet by mouth daily.     Marland Kitchen buPROPion (WELLBUTRIN XL) 150 MG 24 hr tablet Take 150 mg by mouth daily.      No current facility-administered medications for this visit.     Review of Systems  Constitutional: Positive for weight loss (1 pound). Negative for chills, diaphoresis, fever and malaise/fatigue.       Feels "good".  HENT: Negative.  Negative for congestion, ear discharge, ear pain, nosebleeds, sinus pain and sore throat.   Eyes: Positive for pain (both eyes,  feels like pressure). Negative for blurred vision (none), double vision and photophobia.       Vision better with glasses.  Respiratory: Negative for cough, hemoptysis, sputum production and shortness of breath.        Exercise induced asthma.  Cardiovascular: Negative.  Negative for chest pain, palpitations, orthopnea, leg swelling and PND.  Gastrointestinal: Negative.  Negative for abdominal pain, blood in stool, constipation, diarrhea, heartburn, melena, nausea and vomiting.  Genitourinary: Negative.  Negative for dysuria, frequency, hematuria and urgency.  Musculoskeletal: Negative.  Negative for back pain, falls, joint pain, myalgias and neck pain.  Skin: Negative.  Negative for itching and rash.  Neurological: Positive for tingling (in feet), focal weakness (strength in legs comes and goes) and weakness (generalized). Negative for dizziness, tremors, sensory change, speech change, seizures and headaches.       Multiple Sclerosis. Patient notes "delayed responses". Coordination issues. "MS hugs" stable.  Endo/Heme/Allergies: Negative.   Psychiatric/Behavioral: Negative.  Negative for depression and memory loss. The patient is not nervous/anxious and does not have insomnia.   All other systems reviewed and are negative.  Performance status (ECOG): 1  Vitals Blood pressure 106/64, pulse 97, temperature 97.8 F (36.6 C), temperature source Tympanic, resp. rate 16, weight 218 lb 7.6 oz (99.1 kg), SpO2 100 %.  Physical Exam  Constitutional: She is oriented to person, place, and time. She appears well-developed and well-nourished. No distress.  HENT:  Head: Normocephalic and atraumatic.  Mouth/Throat: Oropharynx is clear and moist. No oropharyngeal exudate.  Dark hair.  Mask.  Eyes: Pupils are equal, round, and reactive to light. Conjunctivae and EOM are normal. No scleral icterus.  Brown eyes.  Neck: Normal range of motion. Neck supple. No JVD present.  Cardiovascular: Regular rhythm  and normal heart sounds.  No murmur heard. Pulmonary/Chest: Effort normal and breath sounds normal. No respiratory distress. She has no wheezes. She has no rales. She exhibits no tenderness.  Abdominal: Soft. Bowel sounds are normal. She exhibits no mass. There is no abdominal tenderness. There is no rebound and no guarding.  Musculoskeletal: Normal range of motion.        General: No tenderness or edema.  Lymphadenopathy:    She has no cervical adenopathy.    She has no axillary adenopathy.       Right: No supraclavicular adenopathy present.       Left: No supraclavicular adenopathy present.  Neurological: She is alert and oriented to person, place, and time.  Finger to nose with slightly stuttered motion; +/- Rhomberg (sways).  Skin: Skin is warm and dry. She is not diaphoretic.  Psychiatric: She has a normal mood and affect. Her behavior is normal. Judgment and thought content normal.  Nursing note and vitals reviewed.   Appointment on 10/05/2018  Component Date Value Ref Range  Status  . WBC 10/05/2018 7.3  4.0 - 10.5 K/uL Final  . RBC 10/05/2018 4.36  3.87 - 5.11 MIL/uL Final  . Hemoglobin 10/05/2018 12.7  12.0 - 15.0 g/dL Final  . HCT 16/10/960409/04/2018 39.3  36.0 - 46.0 % Final  . MCV 10/05/2018 90.1  80.0 - 100.0 fL Final  . MCH 10/05/2018 29.1  26.0 - 34.0 pg Final  . MCHC 10/05/2018 32.3  30.0 - 36.0 g/dL Final  . RDW 54/09/811909/04/2018 13.1  11.5 - 15.5 % Final  . Platelets 10/05/2018 261  150 - 400 K/uL Final  . nRBC 10/05/2018 0.0  0.0 - 0.2 % Final  . Neutrophils Relative % 10/05/2018 65  % Final  . Neutro Abs 10/05/2018 4.8  1.7 - 7.7 K/uL Final  . Lymphocytes Relative 10/05/2018 26  % Final  . Lymphs Abs 10/05/2018 1.9  0.7 - 4.0 K/uL Final  . Monocytes Relative 10/05/2018 6  % Final  . Monocytes Absolute 10/05/2018 0.5  0.1 - 1.0 K/uL Final  . Eosinophils Relative 10/05/2018 2  % Final  . Eosinophils Absolute 10/05/2018 0.1  0.0 - 0.5 K/uL Final  . Basophils Relative 10/05/2018  1  % Final  . Basophils Absolute 10/05/2018 0.1  0.0 - 0.1 K/uL Final  . Immature Granulocytes 10/05/2018 0  % Final  . Abs Immature Granulocytes 10/05/2018 0.02  0.00 - 0.07 K/uL Final   Performed at [facility], [address] [place], [place] [address]    Assessment:  Carly Martin is a 33 y.o. female with multiple sclerosis.  She was diagnosed on 07/04/2017.    Patient was [facility] to [facility] from 07/03/2017 - 07/05/2017 with left leg tingling and numbness.  She received high-dose steroids x 3 days with resolution of symptoms.    LP on 07/04/2017 revealed eight oligoclonal bands were observed in the CSF.  MRI cervical and thoracic spine on 07/03/2017 revealed no evidence of a demyelinating disease.  Head MRI on 07/03/2017 revealed scattered foci of abnormal T2 and FLAIR signal in the frontal deep and subcortical white matter and in the parietal cortical and subcortical brain.  She has + MOG testing.  Labs on 07/18/2017 revealed: Hepatitis C antibody < 0.1.  Hepatitis B surface antibody 7.4 (immunity > 9.9).  Hepatitis B core antibody total negative.  Quantiferon gold negative.  Lyme IgG/IgM < 0.91 and IgM quantitative 1.08 (0-0.79).  JC virus antibody index was 3.01 (positive).  Hepatitis B surface antigen and hepatitis B core antibody were negative on 09/21/2017.  Hepatitis B surface antibody was reactive on 10/03/2018.  Hepatitis B core antibody total was negative on 10/03/2018.   Dr Karn CassisBerglund's office notes patient received hepatitis B vaccine on 09/01/2017.  She received Rituxan on 09/28/2017, 10/12/2017, and 03/29/2018.  Symptomatically, she is doing "pretty good".  She notes lower extremity symptoms.  MS hugs are stable.  Plan: 1.   Labs today: CBC with diff, CMP. 2.   Multiple sclerosis Clinically, she is doing "pretty good".    She is tolerating Rituxan well. Review plan for Rituxan 1000 mg today (6 months after last dose) . Review plan by neurology for  Rituxan every 8 months after this dose. Confirm hepatitis testing -done. 3.   RTC in 8 months for MD assessment, labs (CBC with diff, CMP), and cycle #5 Rituxan.  I discussed the assessment and treatment plan with the patient.  The patient was provided an opportunity to ask questions and all were answered.  The  patient agreed with the plan and demonstrated an understanding of the instructions.  The patient was advised to call back if the symptoms worsen or if the condition fails to improve as anticipated.   Rosey BathMelissa C Corcoran, MD, PhD    10/05/2018, 9:02 AM  I, Theador HawthorneAlexis Patterson, am acting as scribe for General MotorsMelissa C. Merlene Pullingorcoran, MD, PhD.  I, Melissa C. Merlene Pullingorcoran, MD, have reviewed the above documentation for accuracy and completeness, and I agree with the above.

## 2018-10-04 ENCOUNTER — Other Ambulatory Visit: Payer: Self-pay

## 2018-10-05 ENCOUNTER — Inpatient Hospital Stay: Payer: POS | Attending: Hematology and Oncology

## 2018-10-05 ENCOUNTER — Telehealth: Payer: Self-pay

## 2018-10-05 ENCOUNTER — Inpatient Hospital Stay: Payer: POS

## 2018-10-05 ENCOUNTER — Telehealth: Payer: Self-pay | Admitting: *Deleted

## 2018-10-05 ENCOUNTER — Inpatient Hospital Stay (HOSPITAL_BASED_OUTPATIENT_CLINIC_OR_DEPARTMENT_OTHER): Payer: POS | Admitting: Hematology and Oncology

## 2018-10-05 ENCOUNTER — Encounter: Payer: Self-pay | Admitting: Hematology and Oncology

## 2018-10-05 VITALS — BP 125/74 | HR 88 | Temp 98.8°F | Resp 18

## 2018-10-05 VITALS — BP 106/64 | HR 97 | Temp 97.8°F | Resp 16 | Wt 218.5 lb

## 2018-10-05 DIAGNOSIS — G35 Multiple sclerosis: Secondary | ICD-10-CM

## 2018-10-05 DIAGNOSIS — R531 Weakness: Secondary | ICD-10-CM | POA: Diagnosis not present

## 2018-10-05 DIAGNOSIS — H538 Other visual disturbances: Secondary | ICD-10-CM | POA: Insufficient documentation

## 2018-10-05 DIAGNOSIS — Z5112 Encounter for antineoplastic immunotherapy: Secondary | ICD-10-CM | POA: Insufficient documentation

## 2018-10-05 DIAGNOSIS — Z79899 Other long term (current) drug therapy: Secondary | ICD-10-CM | POA: Diagnosis not present

## 2018-10-05 LAB — CBC WITH DIFFERENTIAL/PLATELET
Abs Immature Granulocytes: 0.02 10*3/uL (ref 0.00–0.07)
Basophils Absolute: 0.1 10*3/uL (ref 0.0–0.1)
Basophils Relative: 1 %
Eosinophils Absolute: 0.1 10*3/uL (ref 0.0–0.5)
Eosinophils Relative: 2 %
HCT: 39.3 % (ref 36.0–46.0)
Hemoglobin: 12.7 g/dL (ref 12.0–15.0)
Immature Granulocytes: 0 %
Lymphocytes Relative: 26 %
Lymphs Abs: 1.9 10*3/uL (ref 0.7–4.0)
MCH: 29.1 pg (ref 26.0–34.0)
MCHC: 32.3 g/dL (ref 30.0–36.0)
MCV: 90.1 fL (ref 80.0–100.0)
Monocytes Absolute: 0.5 10*3/uL (ref 0.1–1.0)
Monocytes Relative: 6 %
Neutro Abs: 4.8 10*3/uL (ref 1.7–7.7)
Neutrophils Relative %: 65 %
Platelets: 261 10*3/uL (ref 150–400)
RBC: 4.36 MIL/uL (ref 3.87–5.11)
RDW: 13.1 % (ref 11.5–15.5)
WBC: 7.3 10*3/uL (ref 4.0–10.5)
nRBC: 0 % (ref 0.0–0.2)

## 2018-10-05 LAB — COMPREHENSIVE METABOLIC PANEL
ALT: 14 U/L (ref 0–44)
AST: 14 U/L — ABNORMAL LOW (ref 15–41)
Albumin: 3.9 g/dL (ref 3.5–5.0)
Alkaline Phosphatase: 47 U/L (ref 38–126)
Anion gap: 8 (ref 5–15)
BUN: 13 mg/dL (ref 6–20)
CO2: 23 mmol/L (ref 22–32)
Calcium: 9 mg/dL (ref 8.9–10.3)
Chloride: 104 mmol/L (ref 98–111)
Creatinine, Ser: 0.72 mg/dL (ref 0.44–1.00)
GFR calc Af Amer: >60 mL/min (ref >60)
GFR calc non Af Amer: >60 mL/min (ref >60)
Glucose, Bld: 101 mg/dL — ABNORMAL HIGH (ref 70–99)
Potassium: 3.8 mmol/L (ref 3.5–5.1)
Sodium: 135 mmol/L (ref 135–145)
Total Bilirubin: 0.7 mg/dL (ref 0.3–1.2)
Total Protein: 7.3 g/dL (ref 6.5–8.1)

## 2018-10-05 MED ORDER — ACETAMINOPHEN 500 MG PO TABS
1000.0000 mg | ORAL_TABLET | Freq: Once | ORAL | Status: AC
  Administered 2018-10-05: 1000 mg via ORAL

## 2018-10-05 MED ORDER — SODIUM CHLORIDE 0.9 % IV SOLN
Freq: Once | INTRAVENOUS | Status: AC
  Administered 2018-10-05: 09:00:00 via INTRAVENOUS
  Filled 2018-10-05: qty 250

## 2018-10-05 MED ORDER — SODIUM CHLORIDE 0.9 % IV SOLN
1000.0000 mg | Freq: Once | INTRAVENOUS | Status: AC
  Administered 2018-10-05: 1000 mg via INTRAVENOUS
  Filled 2018-10-05: qty 100

## 2018-10-05 MED ORDER — DIPHENHYDRAMINE HCL 25 MG PO CAPS
50.0000 mg | ORAL_CAPSULE | Freq: Once | ORAL | Status: AC
  Administered 2018-10-05: 50 mg via ORAL

## 2018-10-05 MED ORDER — METHYLPREDNISOLONE SODIUM SUCC 125 MG IJ SOLR
100.0000 mg | Freq: Once | INTRAMUSCULAR | Status: AC
  Administered 2018-10-05: 100 mg via INTRAVENOUS

## 2018-10-05 MED ORDER — SODIUM CHLORIDE 0.9 % IV SOLN
100.0000 mg | Freq: Once | INTRAVENOUS | Status: DC
  Filled 2018-10-05: qty 0.8

## 2018-10-05 NOTE — Telephone Encounter (Signed)
Our office received a Prescription for patient to receive Rituxan every 8 months on 09/18/18 . Patient here today for her 6 month Rituxan. I called Dr Weber Cooks office. I received a verbal order to proceed with 6 month Rituxan today per Dr Melrose Nakayama via Jake Shark CMA.

## 2018-10-05 NOTE — Telephone Encounter (Signed)
Spoke with Daisy with Dr. Gaspar Cola office to inquire if patient has received Hep. B vaccine. Charleston Ropes confirms patient received Hep B Vaccine 09/01/2017.

## 2018-10-05 NOTE — Progress Notes (Signed)
Tolerated Rituxan infusion well today. No complaints of any discomfort at time of discharge. VSS.

## 2018-10-05 NOTE — Progress Notes (Signed)
Pt here for follow up. Denies any concerns at this time.  

## 2018-10-05 NOTE — Patient Instructions (Signed)
Rituximab injection What is this medicine? RITUXIMAB (ri TUX i mab) is a monoclonal antibody. It is used to treat certain types of cancer like non-Hodgkin lymphoma and chronic lymphocytic leukemia. It is also used to treat rheumatoid arthritis, granulomatosis with polyangiitis (or Wegener's granulomatosis), microscopic polyangiitis, and pemphigus vulgaris. This medicine may be used for other purposes; ask your health care provider or pharmacist if you have questions. COMMON BRAND NAME(S): Rituxan, RUXIENCE What should I tell my health care provider before I take this medicine? They need to know if you have any of these conditions:  heart disease  infection (especially a virus infection such as hepatitis B, chickenpox, cold sores, or herpes)  immune system problems  irregular heartbeat  kidney disease  low blood counts, like low white cell, platelet, or red cell counts  lung or breathing disease, like asthma  recently received or scheduled to receive a vaccine  an unusual or allergic reaction to rituximab, other medicines, foods, dyes, or preservatives  pregnant or trying to get pregnant  breast-feeding How should I use this medicine? This medicine is for infusion into a vein. It is administered in a hospital or clinic by a specially trained health care professional. A special MedGuide will be given to you by the pharmacist with each prescription and refill. Be sure to read this information carefully each time. Talk to your pediatrician regarding the use of this medicine in children. This medicine is not approved for use in children. Overdosage: If you think you have taken too much of this medicine contact a poison control center or emergency room at once. NOTE: This medicine is only for you. Do not share this medicine with others. What if I miss a dose? It is important not to miss a dose. Call your doctor or health care professional if you are unable to keep an appointment. What  may interact with this medicine?  cisplatin  live virus vaccines This list may not describe all possible interactions. Give your health care provider a list of all the medicines, herbs, non-prescription drugs, or dietary supplements you use. Also tell them if you smoke, drink alcohol, or use illegal drugs. Some items may interact with your medicine. What should I watch for while using this medicine? Your condition will be monitored carefully while you are receiving this medicine. You may need blood work done while you are taking this medicine. This medicine can cause serious allergic reactions. To reduce your risk you may need to take medicine before treatment with this medicine. Take your medicine as directed. In some patients, this medicine may cause a serious brain infection that may cause death. If you have any problems seeing, thinking, speaking, walking, or standing, tell your healthcare professional right away. If you cannot reach your healthcare professional, urgently seek other source of medical care. Call your doctor or health care professional for advice if you get a fever, chills or sore throat, or other symptoms of a cold or flu. Do not treat yourself. This drug decreases your body's ability to fight infections. Try to avoid being around people who are sick. Do not become pregnant while taking this medicine or for at least 12 months after stopping it. Women should inform their doctor if they wish to become pregnant or think they might be pregnant. There is a potential for serious side effects to an unborn child. Talk to your health care professional or pharmacist for more information. Do not breast-feed an infant while taking this medicine or for at   least 6 months after stopping it. What side effects may I notice from receiving this medicine? Side effects that you should report to your doctor or health care professional as soon as possible:  allergic reactions like skin rash, itching or  hives; swelling of the face, lips, or tongue  breathing problems  chest pain  changes in vision  diarrhea  headache with fever, neck stiffness, sensitivity to light, nausea, or confusion  fast, irregular heartbeat  loss of memory  low blood counts - this medicine may decrease the number of white blood cells, red blood cells and platelets. You may be at increased risk for infections and bleeding.  mouth sores  problems with balance, talking, or walking  redness, blistering, peeling or loosening of the skin, including inside the mouth  signs of infection - fever or chills, cough, sore throat, pain or difficulty passing urine  signs and symptoms of kidney injury like trouble passing urine or change in the amount of urine  signs and symptoms of liver injury like dark yellow or brown urine; general ill feeling or flu-like symptoms; light-colored stools; loss of appetite; nausea; right upper belly pain; unusually weak or tired; yellowing of the eyes or skin  signs and symptoms of low blood pressure like dizziness; feeling faint or lightheaded, falls; unusually weak or tired  stomach pain  swelling of the ankles, feet, hands  unusual bleeding or bruising  vomiting Side effects that usually do not require medical attention (report to your doctor or health care professional if they continue or are bothersome):  headache  joint pain  muscle cramps or muscle pain  nausea  tiredness This list may not describe all possible side effects. Call your doctor for medical advice about side effects. You may report side effects to FDA at 1-800-FDA-1088. Where should I keep my medicine? This drug is given in a hospital or clinic and will not be stored at home. NOTE: This sheet is a summary. It may not cover all possible information. If you have questions about this medicine, talk to your doctor, pharmacist, or health care provider.  2020 Elsevier/Gold Standard (2018-03-01  22:01:36)  

## 2018-10-11 ENCOUNTER — Ambulatory Visit: Payer: POS | Admitting: Hematology and Oncology

## 2018-10-11 ENCOUNTER — Ambulatory Visit: Payer: POS

## 2018-10-11 ENCOUNTER — Other Ambulatory Visit: Payer: POS

## 2018-10-26 ENCOUNTER — Encounter: Payer: Self-pay | Admitting: Internal Medicine

## 2018-12-20 ENCOUNTER — Ambulatory Visit: Payer: POS | Attending: Internal Medicine

## 2019-01-31 ENCOUNTER — Ambulatory Visit: Payer: POS | Attending: Neurology

## 2019-01-31 DIAGNOSIS — G4733 Obstructive sleep apnea (adult) (pediatric): Secondary | ICD-10-CM | POA: Insufficient documentation

## 2019-02-05 ENCOUNTER — Other Ambulatory Visit: Payer: Self-pay

## 2019-05-03 ENCOUNTER — Telehealth: Payer: Self-pay | Admitting: *Deleted

## 2019-05-03 NOTE — Telephone Encounter (Signed)
Patients next appt for Rituxan is 06/04/19. Request for labs and clearance form faxed to [facility]~ Dr Malvin Johns.

## 2019-05-23 ENCOUNTER — Encounter: Payer: Self-pay | Admitting: Internal Medicine

## 2019-05-23 ENCOUNTER — Other Ambulatory Visit: Payer: Self-pay

## 2019-05-23 MED ORDER — FLUCONAZOLE 150 MG PO TABS: 150.0000 mg | ORAL_TABLET | Freq: Once | ORAL | 0 refills | Status: AC

## 2019-05-29 NOTE — Progress Notes (Signed)
Pharmacist Chemotherapy Monitoring - Follow Up Assessment    I verify that I have reviewed each item in the below checklist:  . Regimen for the patient is scheduled for the appropriate day and plan matches scheduled date. Marland Kitchen Appropriate non-routine labs are ordered dependent on drug ordered. . If applicable, additional medications reviewed and ordered per protocol based on lifetime cumulative doses and/or treatment regimen.   Plan for follow-up and/or issues identified: No . I-vent associated with next due treatment: No . MD and/or nursing notified: No  Carly Martin K 05/29/2019 2:09 PM

## 2019-06-03 NOTE — Progress Notes (Signed)
[facility]  [address] [place], [place] [address] Phone: [phone]  Fax: [phone]   Clinic Day:  06/04/2019  Referring physician: Glean Hess, MD  Chief Complaint: Carly Martin is a 34 y.o. female with multiple sclerosis who is seen for 8 month assessment prior to cycle #5 Rituxan.   HPI: The patient was last seen in the medical oncology clinic on 10/05/2018. At that time, she was doing "pretty good".  She noted lower extremity symptoms.  MS hugs were stable. CBC was normal. She received cycle #4 Rituxan.    She saw Dr Jacqulynn Cad[facility] in the [facility] neurologic clinic via telemedicine on 05/11/2019. She was given 1250mg /day x3 prednisone for treatment for an MS flare. She had a yeast infection after her prednisone treatment and was treated with diflucan.   Labs on 05/25/2019 included a hematocrit of 42.4, hemoglobin 13.2, platelets 319,000, WBC 9400 with an [place] of 5600.  Creatinine was 0.8 and liver function tests normal.  Hepatitis B core antibody was negative.  During the interim, she has felt well overall.  She is scheduled for a head, cervical spine, and thoracic spine MRI on 06/21/2019.   She has a calcium, zinc, magnesium multivitamin that she would like to take.    Past Medical History:  Diagnosis Date  . Asthma     Past Surgical History:  Procedure Laterality Date  . CERVICAL BIOPSY  W/ LOOP ELECTRODE EXCISION  2008  . TONSILLECTOMY      Family History  Problem Relation Age of Onset  . Migraines Sister   . Cancer Maternal Grandfather   . Cancer Paternal Grandfather     Social History:  reports that she has been smoking cigarettes. She has a 2.50 pack-year smoking history. She has never used smokeless tobacco. She reports current alcohol use. She reports that she does not use drugs. She smokes 5-6 cigarettes/day.  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  She lives in [place]. The patient is alone today.   Allergies:  Allergies  Allergen Reactions  . Codeine Shortness Of Breath    Current Medications: Current Outpatient Medications  Medication Sig Dispense Refill  . Albuterol Sulfate (PROAIR RESPICLICK) 578 (90 Base) MCG/ACT AEPB Inhale 2 puffs into the lungs 4 (four) times daily as needed. 1 each 5  . Ascorbic Acid (VITAMIN C) 1000 MG tablet Take 1,000 mg by mouth daily.    Marland Kitchen buPROPion (WELLBUTRIN XL) 150 MG 24 hr tablet Take 150 mg by mouth daily.     . Cholecalciferol (VITAMIN D3 SUPER STRENGTH) 50 MCG (2000 UT) TABS Take 2 tablets by mouth daily.     Marland Kitchen etonogestrel (IMPLANON) 68 MG IMPL implant 1 each by Subdermal route once.    . fluticasone (FLONASE) 50 MCG/ACT nasal spray Place 2 sprays into both nostrils daily. (Patient taking differently: Place 2 sprays into both nostrils as needed. ) 16 g 0  . ibuprofen (ADVIL,MOTRIN) 800 MG tablet Take 1 tablet (800 mg total) by mouth every 8 (eight) hours as needed. 90 tablet 3  . modafinil (PROVIGIL) 100 MG tablet Take 200 mg by mouth daily.     . Multiple Vitamin (MULTI-VITAMIN) tablet Take 1 tablet by mouth daily.     . riTUXimab in sodium chloride 0.9 % 250 mL Inject 1,000 mg into the vein every 6 (six) months.    Marland Kitchen URINARY HEALTH/CRANBERRY PO Take  1 tablet by mouth daily.     . predniSONE (DELTASONE) 50 MG tablet TAKE 25 TABLETS BY MOUTH DAILY AFTER BREAKFAST FOR 3 DAYS     No current facility-administered medications for this visit.    Review of Systems  Constitutional: Negative for chills, diaphoresis, fever, malaise/fatigue and weight loss (Up 6 lbs).       Feels "good".  HENT: Negative.  Negative for congestion, ear discharge, ear pain, nosebleeds, sinus pain and sore throat.   Eyes: Positive for pain (both eyes, feels like pressure). Negative for blurred vision (none), double vision and photophobia.       Vision better with glasses.  Respiratory: Negative for  cough, hemoptysis, sputum production and shortness of breath.        Exercise induced asthma.  Cardiovascular: Negative.  Negative for chest pain, palpitations, orthopnea, leg swelling and PND.  Gastrointestinal: Negative.  Negative for abdominal pain, blood in stool, constipation, diarrhea, heartburn, melena, nausea and vomiting.  Genitourinary: Negative.  Negative for dysuria, frequency, hematuria and urgency.  Musculoskeletal: Negative.  Negative for back pain, falls, joint pain, myalgias and neck pain.  Skin: Negative.  Negative for itching and rash.  Neurological: Positive for tingling (in feet), focal weakness (strength in legs comes and goes) and weakness (generalized). Negative for dizziness, tremors, sensory change, speech change, seizures and headaches.       Multiple Sclerosis. Patient notes "delayed responses". Coordination issues. "MS hugs" stable.  Endo/Heme/Allergies: Negative.   Psychiatric/Behavioral: Negative.  Negative for depression and memory loss. The patient is not nervous/anxious and does not have insomnia.   All other systems reviewed and are negative.  Performance status (ECOG): 1 - Symptomatic but completely ambulatory   Vitals Blood pressure 128/72, pulse 87, temperature 97.6 F (36.4 C), temperature source Tympanic, resp. rate 16, weight 224 lb 15.7 oz (102 kg), SpO2 100 %.  Physical Exam  Constitutional: She is oriented to person, place, and time. She appears well-developed and well-nourished. No distress.  HENT:  Head: Normocephalic and atraumatic.  Mouth/Throat: Oropharynx is clear and moist. No oropharyngeal exudate.  Dark hair.  Mask.  Eyes: Pupils are equal, round, and reactive to light. Conjunctivae and EOM are normal. No scleral icterus.  Brown eyes.  Neck: No JVD present.  Cardiovascular: Normal rate, regular rhythm and normal heart sounds.  No murmur heard. Pulmonary/Chest: Effort normal and breath sounds normal. No respiratory distress. She has no  wheezes. She has no rales. She exhibits no tenderness.  Abdominal: Soft. Bowel sounds are normal. She exhibits no mass. There is no abdominal tenderness. There is no rebound and no guarding.  Musculoskeletal:        General: No tenderness or edema. Normal range of motion.     Cervical back: Normal range of motion and neck supple.  Lymphadenopathy:    She has no cervical adenopathy.    She has no axillary adenopathy.       Right: No supraclavicular adenopathy present.       Left: No supraclavicular adenopathy present.  Neurological: She is alert and oriented to person, place, and time.  Finger to nose with slightly stuttered motion; +/- [place] (sways).  Skin: Skin is warm and dry. She is not diaphoretic.  Psychiatric: She has a normal mood and affect. Her behavior is normal. Judgment and thought content normal.  Nursing note and vitals reviewed.   Appointment on 06/04/2019  Component Date Value Ref Range Status  . Sodium 06/04/2019 135  135 - 145 mmol/L Final  .  Potassium 06/04/2019 3.7  3.5 - 5.1 mmol/L Final  . Chloride 06/04/2019 105  98 - 111 mmol/L Final  . CO2 06/04/2019 24  22 - 32 mmol/L Final  . Glucose, Bld 06/04/2019 119* 70 - 99 mg/dL Final   Glucose reference range applies only to samples taken after fasting for at least 8 hours.  . BUN 06/04/2019 11  6 - 20 mg/dL Final  . Creatinine, Ser 06/04/2019 0.69  0.44 - 1.00 mg/dL Final  . Calcium 68/01/7516 8.7* 8.9 - 10.3 mg/dL Final  . Total Protein 06/04/2019 7.0  6.5 - 8.1 g/dL Final  . Albumin 00/17/4944 3.9  3.5 - 5.0 g/dL Final  . AST 96/75/9163 14* 15 - 41 U/L Final  . ALT 06/04/2019 13  0 - 44 U/L Final  . Alkaline Phosphatase 06/04/2019 45  38 - 126 U/L Final  . Total Bilirubin 06/04/2019 0.7  0.3 - 1.2 mg/dL Final  . GFR calc non Af Amer 06/04/2019 >60  >60 mL/min Final  . GFR calc Af Amer 06/04/2019 >60  >60 mL/min Final  . Anion gap 06/04/2019 6  5 - 15 Final   Performed at [facility], [address] [place], [place] [address]  . WBC 06/04/2019 5.6  4.0 - 10.5 K/uL Final  . RBC 06/04/2019 4.29  3.87 - 5.11 MIL/uL Final  . Hemoglobin 06/04/2019 12.4  12.0 - 15.0 g/dL Final  . HCT 99/35/7017 38.7  36.0 - 46.0 % Final  . MCV 06/04/2019 90.2  80.0 - 100.0 fL Final  . MCH 06/04/2019 28.9  26.0 - 34.0 pg Final  . MCHC 06/04/2019 32.0  30.0 - 36.0 g/dL Final  . RDW 79/39/0300 13.4  11.5 - 15.5 % Final  . Platelets 06/04/2019 264  150 - 400 K/uL Final  . nRBC 06/04/2019 0.0  0.0 - 0.2 % Final  . Neutrophils Relative % 06/04/2019 57  % Final  . Neutro Abs 06/04/2019 3.2  1.7 - 7.7 K/uL Final  . Lymphocytes Relative 06/04/2019 32  % Final  . Lymphs Abs 06/04/2019 1.8  0.7 - 4.0 K/uL Final  . Monocytes Relative 06/04/2019 7  % Final  . Monocytes Absolute 06/04/2019 0.4  0.1 - 1.0 K/uL Final  . Eosinophils Relative 06/04/2019 3  % Final  . Eosinophils Absolute 06/04/2019 0.1  0.0 - 0.5 K/uL Final  . Basophils Relative 06/04/2019 1  % Final  . Basophils Absolute 06/04/2019 0.1  0.0 - 0.1 K/uL Final  . Immature Granulocytes 06/04/2019 0  % Final  . Abs Immature Granulocytes 06/04/2019 0.02  0.00 - 0.07 K/uL Final   Performed at [facility], [address], [place], [place] [address]    Assessment:  Carly Martin is a 34 y.o. female with multiple sclerosis.  She was diagnosed on 07/04/2017.    Patient was admitted to [facility] from 07/03/2017 - 07/05/2017 with left leg tingling and numbness.  She received high-dose steroids x 3 days with resolution of symptoms.    LP on 07/04/2017 revealed eight oligoclonal bands were observed in the CSF.  MRI cervical and thoracic spine on 07/03/2017 revealed no evidence of a demyelinating disease.  Head MRI on 07/03/2017 revealed scattered foci of abnormal T2 and FLAIR signal in the frontal deep and subcortical white matter and in the parietal cortical and subcortical brain.  She has + MOG testing.  Labs on 07/18/2017 revealed:  Hepatitis C antibody < 0.1.  Hepatitis B surface antibody 7.4 (  immunity > 9.9).  Hepatitis B core antibody total negative.  Quantiferon gold negative.  Lyme IgG/IgM < 0.91 and IgM quantitative 1.08 (0-0.79).  JC virus antibody index was 3.01 (positive).  Hepatitis B surface antigen and hepatitis B core antibody were negative on 09/21/2017.  Hepatitis B surface antibody was reactive on 10/03/2018.  Hepatitis B core antibody total was negative on 10/03/2018.   Dr Karn Cassis office notes patient received hepatitis B vaccine on 09/01/2017.  She received Rituxan on 09/28/2017, 10/12/2017, 03/29/2018, and 10/05/2018.  Symptomatically, she feels "good".  She notes intermittent symptoms.  Exam is stable.  Plan: 1.   Labs today: CBC with diff, CMP 2.   Multiple sclerosis Clinically, she feels "good". She had an MS flare in 05/2019.    She tolerates Rituxan well. Rituxan 1000 mg today (8 months after last dose) . Continue Rituxan every 8 months after this dose. She is scheduled for head and cervical, thoracic spine MRI on 06/21/2019. 3.   Rituxan today. 4.   RTC in 8 months for MD assessment, labs (CBC with diff, CMP) and Rituxan.  I discussed the assessment and treatment plan with the patient.  The patient was provided an opportunity to ask questions and all were answered.  The patient agreed with the plan and demonstrated an understanding of the instructions.  The patient was advised to call back if the symptoms worsen or if the condition fails to improve as anticipated.  I provided 14 minutes (10:14 AM - 10:28 AM) of face-to-face time during this this encounter and > 50% was spent counseling as documented under my assessment and plan. An additional 5 minutes were spent reviewing her chart (Epic and Care Everywhere) including notes, labs, and imaging studies.    Rosey Bath, MD, PhD    06/04/2019, 10:28 AM   I, Mal Misty, am acting as a Neurosurgeon for [employer]. Merlene Pulling, MD.   I, Melissa C.  Merlene Pulling, MD, have reviewed the above documentation for accuracy and completeness, and I agree with the above.

## 2019-06-04 ENCOUNTER — Encounter: Payer: Self-pay | Admitting: Hematology and Oncology

## 2019-06-04 ENCOUNTER — Inpatient Hospital Stay: Payer: POS

## 2019-06-04 ENCOUNTER — Other Ambulatory Visit: Payer: Self-pay

## 2019-06-04 ENCOUNTER — Inpatient Hospital Stay: Payer: POS | Attending: Hematology and Oncology | Admitting: Hematology and Oncology

## 2019-06-04 VITALS — BP 138/78 | HR 88 | Temp 98.6°F | Resp 16

## 2019-06-04 VITALS — BP 128/72 | HR 87 | Temp 97.6°F | Resp 16 | Wt 225.0 lb

## 2019-06-04 DIAGNOSIS — Z809 Family history of malignant neoplasm, unspecified: Secondary | ICD-10-CM | POA: Insufficient documentation

## 2019-06-04 DIAGNOSIS — G35 Multiple sclerosis: Secondary | ICD-10-CM | POA: Insufficient documentation

## 2019-06-04 DIAGNOSIS — R531 Weakness: Secondary | ICD-10-CM | POA: Insufficient documentation

## 2019-06-04 DIAGNOSIS — Z5112 Encounter for antineoplastic immunotherapy: Secondary | ICD-10-CM | POA: Diagnosis not present

## 2019-06-04 DIAGNOSIS — Z8249 Family history of ischemic heart disease and other diseases of the circulatory system: Secondary | ICD-10-CM | POA: Insufficient documentation

## 2019-06-04 DIAGNOSIS — Z79899 Other long term (current) drug therapy: Secondary | ICD-10-CM | POA: Diagnosis not present

## 2019-06-04 LAB — COMPREHENSIVE METABOLIC PANEL
ALT: 13 U/L (ref 0–44)
AST: 14 U/L — ABNORMAL LOW (ref 15–41)
Albumin: 3.9 g/dL (ref 3.5–5.0)
Alkaline Phosphatase: 45 U/L (ref 38–126)
Anion gap: 6 (ref 5–15)
BUN: 11 mg/dL (ref 6–20)
CO2: 24 mmol/L (ref 22–32)
Calcium: 8.7 mg/dL — ABNORMAL LOW (ref 8.9–10.3)
Chloride: 105 mmol/L (ref 98–111)
Creatinine, Ser: 0.69 mg/dL (ref 0.44–1.00)
GFR calc Af Amer: >60 mL/min (ref >60)
GFR calc non Af Amer: >60 mL/min (ref >60)
Glucose, Bld: 119 mg/dL — ABNORMAL HIGH (ref 70–99)
Potassium: 3.7 mmol/L (ref 3.5–5.1)
Sodium: 135 mmol/L (ref 135–145)
Total Bilirubin: 0.7 mg/dL (ref 0.3–1.2)
Total Protein: 7 g/dL (ref 6.5–8.1)

## 2019-06-04 LAB — CBC WITH DIFFERENTIAL/PLATELET
Abs Immature Granulocytes: 0.02 10*3/uL (ref 0.00–0.07)
Basophils Absolute: 0.1 10*3/uL (ref 0.0–0.1)
Basophils Relative: 1 %
Eosinophils Absolute: 0.1 10*3/uL (ref 0.0–0.5)
Eosinophils Relative: 3 %
HCT: 38.7 % (ref 36.0–46.0)
Hemoglobin: 12.4 g/dL (ref 12.0–15.0)
Immature Granulocytes: 0 %
Lymphocytes Relative: 32 %
Lymphs Abs: 1.8 10*3/uL (ref 0.7–4.0)
MCH: 28.9 pg (ref 26.0–34.0)
MCHC: 32 g/dL (ref 30.0–36.0)
MCV: 90.2 fL (ref 80.0–100.0)
Monocytes Absolute: 0.4 10*3/uL (ref 0.1–1.0)
Monocytes Relative: 7 %
Neutro Abs: 3.2 10*3/uL (ref 1.7–7.7)
Neutrophils Relative %: 57 %
Platelets: 264 10*3/uL (ref 150–400)
RBC: 4.29 MIL/uL (ref 3.87–5.11)
RDW: 13.4 % (ref 11.5–15.5)
WBC: 5.6 10*3/uL (ref 4.0–10.5)
nRBC: 0 % (ref 0.0–0.2)

## 2019-06-04 MED ORDER — METHYLPREDNISOLONE SODIUM SUCC 125 MG IJ SOLR
100.0000 mg | Freq: Once | INTRAMUSCULAR | Status: AC
  Administered 2019-06-04: 100 mg via INTRAVENOUS
  Filled 2019-06-04: qty 2

## 2019-06-04 MED ORDER — SODIUM CHLORIDE 0.9 % IV SOLN
1000.0000 mg | Freq: Once | INTRAVENOUS | Status: AC
  Administered 2019-06-04: 1000 mg via INTRAVENOUS
  Filled 2019-06-04: qty 100

## 2019-06-04 MED ORDER — SODIUM CHLORIDE 0.9 % IV SOLN: 100.0000 mg | Freq: Once | INTRAVENOUS | Status: DC

## 2019-06-04 MED ORDER — DIPHENHYDRAMINE HCL 25 MG PO CAPS
50.0000 mg | ORAL_CAPSULE | Freq: Once | ORAL | Status: AC
  Administered 2019-06-04: 50 mg via ORAL
  Filled 2019-06-04: qty 2

## 2019-06-04 MED ORDER — SODIUM CHLORIDE 0.9 % IV SOLN
Freq: Once | INTRAVENOUS | Status: AC
  Filled 2019-06-04: qty 250

## 2019-06-04 MED ORDER — ACETAMINOPHEN 500 MG PO TABS
1000.0000 mg | ORAL_TABLET | Freq: Once | ORAL | Status: AC
  Administered 2019-06-04: 1000 mg via ORAL
  Filled 2019-06-04: qty 2

## 2019-06-04 NOTE — Progress Notes (Signed)
No new c/o noted today. 

## 2019-06-04 NOTE — Patient Instructions (Signed)
Rituximab injection What is this medicine? RITUXIMAB (ri TUX i mab) is a monoclonal antibody. It is used to treat certain types of cancer like non-Hodgkin lymphoma and chronic lymphocytic leukemia. It is also used to treat rheumatoid arthritis, granulomatosis with polyangiitis (or Wegener's granulomatosis), microscopic polyangiitis, and pemphigus vulgaris. This medicine may be used for other purposes; ask your health care provider or pharmacist if you have questions. COMMON BRAND NAME(S): Rituxan, RUXIENCE What should I tell my health care provider before I take this medicine? They need to know if you have any of these conditions:  heart disease  infection (especially a virus infection such as hepatitis B, chickenpox, cold sores, or herpes)  immune system problems  irregular heartbeat  kidney disease  low blood counts, like low white cell, platelet, or red cell counts  lung or breathing disease, like asthma  recently received or scheduled to receive a vaccine  an unusual or allergic reaction to rituximab, other medicines, foods, dyes, or preservatives  pregnant or trying to get pregnant  breast-feeding How should I use this medicine? This medicine is for infusion into a vein. It is administered in a hospital or clinic by a specially trained health care professional. A special MedGuide will be given to you by the pharmacist with each prescription and refill. Be sure to read this information carefully each time. Talk to your pediatrician regarding the use of this medicine in children. This medicine is not approved for use in children. Overdosage: If you think you have taken too much of this medicine contact a poison control center or emergency room at once. NOTE: This medicine is only for you. Do not share this medicine with others. What if I miss a dose? It is important not to miss a dose. Call your doctor or health care professional if you are unable to keep an appointment. What  may interact with this medicine?  cisplatin  live virus vaccines This list may not describe all possible interactions. Give your health care provider a list of all the medicines, herbs, non-prescription drugs, or dietary supplements you use. Also tell them if you smoke, drink alcohol, or use illegal drugs. Some items may interact with your medicine. What should I watch for while using this medicine? Your condition will be monitored carefully while you are receiving this medicine. You may need blood work done while you are taking this medicine. This medicine can cause serious allergic reactions. To reduce your risk you may need to take medicine before treatment with this medicine. Take your medicine as directed. In some patients, this medicine may cause a serious brain infection that may cause death. If you have any problems seeing, thinking, speaking, walking, or standing, tell your healthcare professional right away. If you cannot reach your healthcare professional, urgently seek other source of medical care. Call your doctor or health care professional for advice if you get a fever, chills or sore throat, or other symptoms of a cold or flu. Do not treat yourself. This drug decreases your body's ability to fight infections. Try to avoid being around people who are sick. Do not become pregnant while taking this medicine or for at least 12 months after stopping it. Women should inform their doctor if they wish to become pregnant or think they might be pregnant. There is a potential for serious side effects to an unborn child. Talk to your health care professional or pharmacist for more information. Do not breast-feed an infant while taking this medicine or for at   least 6 months after stopping it. What side effects may I notice from receiving this medicine? Side effects that you should report to your doctor or health care professional as soon as possible:  allergic reactions like skin rash, itching or  hives; swelling of the face, lips, or tongue  breathing problems  chest pain  changes in vision  diarrhea  headache with fever, neck stiffness, sensitivity to light, nausea, or confusion  fast, irregular heartbeat  loss of memory  low blood counts - this medicine may decrease the number of white blood cells, red blood cells and platelets. You may be at increased risk for infections and bleeding.  mouth sores  problems with balance, talking, or walking  redness, blistering, peeling or loosening of the skin, including inside the mouth  signs of infection - fever or chills, cough, sore throat, pain or difficulty passing urine  signs and symptoms of kidney injury like trouble passing urine or change in the amount of urine  signs and symptoms of liver injury like dark yellow or brown urine; general ill feeling or flu-like symptoms; light-colored stools; loss of appetite; nausea; right upper belly pain; unusually weak or tired; yellowing of the eyes or skin  signs and symptoms of low blood pressure like dizziness; feeling faint or lightheaded, falls; unusually weak or tired  stomach pain  swelling of the ankles, feet, hands  unusual bleeding or bruising  vomiting Side effects that usually do not require medical attention (report to your doctor or health care professional if they continue or are bothersome):  headache  joint pain  muscle cramps or muscle pain  nausea  tiredness This list may not describe all possible side effects. Call your doctor for medical advice about side effects. You may report side effects to FDA at 1-800-FDA-1088. Where should I keep my medicine? This drug is given in a hospital or clinic and will not be stored at home. NOTE: This sheet is a summary. It may not cover all possible information. If you have questions about this medicine, talk to your doctor, pharmacist, or health care provider.  2020 Elsevier/Gold Standard (2018-03-01  22:01:36)  

## 2019-06-07 ENCOUNTER — Encounter: Payer: Self-pay | Admitting: Internal Medicine

## 2019-06-09 ENCOUNTER — Other Ambulatory Visit: Payer: Self-pay | Admitting: Internal Medicine

## 2019-06-09 NOTE — Telephone Encounter (Signed)
Requested  medications are  due for refill today yes  Requested medications are on the active medication list yes  Last refill 04/16/2018  Future visit scheduled no  Last related visit more than one year ago  Notes to clinic Failed protocol due to related visit within 6 months

## 2019-07-10 ENCOUNTER — Encounter: Payer: Self-pay | Admitting: Internal Medicine

## 2019-07-11 ENCOUNTER — Encounter: Payer: Self-pay | Admitting: Internal Medicine

## 2019-07-11 ENCOUNTER — Other Ambulatory Visit: Payer: Self-pay

## 2019-07-11 ENCOUNTER — Ambulatory Visit: Payer: POS | Admitting: Internal Medicine

## 2019-07-11 VITALS — BP 112/68 | HR 101 | Temp 98.1°F | Ht 65.0 in | Wt 224.0 lb

## 2019-07-11 DIAGNOSIS — N3001 Acute cystitis with hematuria: Secondary | ICD-10-CM | POA: Diagnosis not present

## 2019-07-11 LAB — POC URINALYSIS WITH MICROSCOPIC (NON AUTO)MANUAL RESULT
Bilirubin, UA: NEGATIVE
Blood, UA: NEGATIVE
Crystals: 0
Epithelial cells, urine per micros: 2
Glucose, UA: NEGATIVE
Ketones, UA: NEGATIVE
Leukocytes, UA: NEGATIVE
Mucus, UA: 0
Nitrite, UA: POSITIVE
Protein, UA: NEGATIVE
RBC: 0 M/uL — AB (ref 4.04–5.48)
Spec Grav, UA: 1.02 (ref 1.010–1.025)
Urobilinogen, UA: 0.2 E.U./dL
WBC Casts, UA: 5
pH, UA: 5 (ref 5.0–8.0)

## 2019-07-11 MED ORDER — ALBUTEROL SULFATE HFA 108 (90 BASE) MCG/ACT IN AERS: 2.0000 | INHALATION_SPRAY | Freq: Four times a day (QID) | RESPIRATORY_TRACT | 1 refills | Status: DC | PRN

## 2019-07-11 MED ORDER — NITROFURANTOIN MONOHYD MACRO 100 MG PO CAPS: 100.0000 mg | ORAL_CAPSULE | Freq: Two times a day (BID) | ORAL | 0 refills | Status: AC

## 2019-07-11 NOTE — Progress Notes (Signed)
Date:  07/11/2019   Name:  Carly Martin   DOB:  Jan 06, 1986   MRN:  [id number]   Chief Complaint: Urinary Tract Infection (X9-10 days burning, foul smell, cloudy, had blood in urine it cleared up )  Urinary Tract Infection  This is a new problem. The current episode started in the past 7 days. The problem occurs every urination. The quality of the pain is described as aching. The pain is mild. There has been no fever. Associated symptoms include frequency, hematuria, hesitancy and urgency. Pertinent negatives include no chills or nausea. She has tried increased fluids for the symptoms. The treatment provided mild relief.    Lab Results  Component Value Date   CREATININE 0.69 06/04/2019   BUN 11 06/04/2019   NA 135 06/04/2019   K 3.7 06/04/2019   CL 105 06/04/2019   CO2 24 06/04/2019   Lab Results  Component Value Date   CHOL 165 03/18/2015   HDL 36 (L) 03/18/2015   LDLCALC 120 (H) 03/18/2015   TRIG 46 03/18/2015   CHOLHDL 4.6 (H) 03/18/2015   Lab Results  Component Value Date   TSH 1.320 03/03/2016   Lab Results  Component Value Date   HGBA1C 5.6 03/18/2015   Lab Results  Component Value Date   WBC 5.6 06/04/2019   HGB 12.4 06/04/2019   HCT 38.7 06/04/2019   MCV 90.2 06/04/2019   PLT 264 06/04/2019   Lab Results  Component Value Date   ALT 13 06/04/2019   AST 14 (L) 06/04/2019   ALKPHOS 45 06/04/2019   BILITOT 0.7 06/04/2019     Review of Systems  Constitutional: Negative for chills, fatigue and fever.  Respiratory: Positive for shortness of breath (with exertion - uses albuterol MDI). Negative for chest tightness.   Cardiovascular: Negative for chest pain.  Gastrointestinal: Negative for nausea.  Genitourinary: Positive for frequency, hematuria, hesitancy and urgency.  Neurological: Negative for dizziness and headaches.    Patient Active Problem List   Diagnosis Date Noted  . Multiple sclerosis (HCC) 07/03/2017  . Right upper quadrant abdominal  pain 09/28/2016  . Irritable bowel syndrome with constipation 09/28/2016  . Moderate episode of recurrent major depressive disorder (HCC) 09/28/2016  . Lightheadedness 03/03/2016  . Numbness and tingling 09/03/2015  . Chronic tension-type headache, not intractable 09/03/2015  . Sciatica of right side 03/17/2015  . Iron deficiency anemia 03/17/2015  . History of loop electrical excision procedure (LEEP) 09/28/2013    Allergies  Allergen Reactions  . Codeine Shortness Of Breath    Past Surgical History:  Procedure Laterality Date  . CERVICAL BIOPSY  W/ LOOP ELECTRODE EXCISION  2008  . TONSILLECTOMY      Social History   Tobacco Use  . Smoking status: Current Every Day Smoker    Packs/day: 0.25    Years: 10.00    Pack years: 2.50    Types: Cigarettes  . Smokeless tobacco: Never Used  Substance Use Topics  . Alcohol use: Yes    Alcohol/week: 0.0 standard drinks    Comment: socially  . Drug use: No     Medication list has been reviewed and updated.  Current Meds  Medication Sig  . Ascorbic Acid (VITAMIN C) 1000 MG tablet Take 1,000 mg by mouth daily.  Marland Kitchen buPROPion (WELLBUTRIN XL) 150 MG 24 hr tablet Take 150 mg by mouth daily.   . Cholecalciferol (VITAMIN D3 SUPER STRENGTH) 50 MCG (2000 UT) TABS Take 2 tablets by mouth daily.   Marland Kitchen  etonogestrel (IMPLANON) 68 MG IMPL implant 1 each by Subdermal route once.  . fluticasone (FLONASE) 50 MCG/ACT nasal spray Place 2 sprays into both nostrils daily. (Patient taking differently: Place 2 sprays into both nostrils as needed. )  . ibuprofen (ADVIL,MOTRIN) 800 MG tablet Take 1 tablet (800 mg total) by mouth every 8 (eight) hours as needed.  . modafinil (PROVIGIL) 100 MG tablet Take 100 mg by mouth daily.   . Multiple Minerals (CALCIUM/MAGNESIUM/ZINC PO) Take 1 tablet by mouth daily.  . Multiple Vitamin (MULTI-VITAMIN) tablet Take 1 tablet by mouth daily.   Marland Kitchen PROAIR RESPICLICK 700 (90 Base) MCG/ACT AEPB INHALE 2 PUFFS INTO THE LUNGS  FOUR TIMES DAILY AS NEEDED  . riTUXimab in sodium chloride 0.9 % 250 mL Inject 1,000 mg into the vein every 6 (six) months.  Marland Kitchen URINARY HEALTH/CRANBERRY PO Take 1 tablet by mouth daily. With out cranberry    PHQ 2/9 Scores 07/11/2019 07/26/2018 09/23/2017 03/02/2017  PHQ - 2 Score 0 0 0 6  PHQ- 9 Score 0 0 6 22    BP Readings from Last 3 Encounters:  07/11/19 112/68  06/04/19 138/78  06/04/19 128/72    Physical Exam Vitals and nursing note reviewed.  Constitutional:      Appearance: Normal appearance. She is well-developed.  Cardiovascular:     Rate and Rhythm: Normal rate and regular rhythm.     Heart sounds: Normal heart sounds.  Pulmonary:     Effort: Pulmonary effort is normal. No respiratory distress.     Breath sounds: Normal breath sounds.  Abdominal:     General: Bowel sounds are normal.     Palpations: Abdomen is soft.     Tenderness: There is abdominal tenderness in the suprapubic area. There is no guarding or rebound.  Neurological:     Mental Status: She is alert.     Wt Readings from Last 3 Encounters:  07/11/19 224 lb (101.6 kg)  06/04/19 224 lb 15.7 oz (102 kg)  10/05/18 218 lb 7.6 oz (99.1 kg)    BP 112/68   Pulse (!) 101   Temp 98.1 F (36.7 C) (Oral)   Ht 5\' 5"  (1.651 m)   Wt 224 lb (101.6 kg)   SpO2 98%   BMI 37.28 kg/m   Assessment and Plan: 1. Acute cystitis with hematuria Continue fluids, D-Mannose supplements If recurrent, will need to consider culture - POC urinalysis w microscopic (non auto) - nitrofurantoin, macrocrystal-monohydrate, (MACROBID) 100 MG capsule; Take 1 capsule (100 mg total) by mouth 2 (two) times daily for 7 days.  Dispense: 14 capsule; Refill: 0   Partially dictated using Editor, commissioning. Any errors are unintentional.  Halina Maidens, MD [facility]  07/11/2019

## 2019-08-03 ENCOUNTER — Encounter: Payer: Self-pay | Admitting: Internal Medicine

## 2019-08-03 ENCOUNTER — Ambulatory Visit: Payer: POS | Admitting: Internal Medicine

## 2019-08-03 ENCOUNTER — Other Ambulatory Visit: Payer: Self-pay

## 2019-08-03 ENCOUNTER — Ambulatory Visit: Payer: Self-pay | Admitting: *Deleted

## 2019-08-03 VITALS — BP 108/70 | HR 80 | Temp 97.8°F | Ht 65.0 in | Wt 221.0 lb

## 2019-08-03 DIAGNOSIS — J0100 Acute maxillary sinusitis, unspecified: Secondary | ICD-10-CM | POA: Diagnosis not present

## 2019-08-03 DIAGNOSIS — R42 Dizziness and giddiness: Secondary | ICD-10-CM | POA: Diagnosis not present

## 2019-08-03 MED ORDER — AZITHROMYCIN 250 MG PO TABS: ORAL_TABLET | ORAL | 0 refills | Status: AC

## 2019-08-03 NOTE — Telephone Encounter (Signed)
Pt called with complaints of severe dizziness that started 08/02/19; she says it is intermittent, and it feels like the room is spinning; these episodes lasted about 5 min; the pt had an episode today that lasted about 15 minutes, and she had nausea at that time also; the pt is not currently dizzy; recommendations made per nurse triage protocol; she would prefer to be seen in the office; decision tree completed; the pt sees Dr Judithann Graves, [facility]; spoke with [facility] regarding scheduling pt; pt offered and accepted appt at 1620 with Dr Judithann Graves; she verbalized understanding; will route to office for notification. Reason for Disposition . [1] MODERATE dizziness (e.g., interferes with normal activities) AND [2] has NOT been evaluated by physician for this  (Exception: dizziness caused by heat exposure, sudden standing, or poor fluid intake)  Answer Assessment - Initial Assessment Questions 1. DESCRIPTION: "Describe your dizziness."     Light headed; lasts about 5 min on 08/02/19; lasted 15 min on 08/03/19 2. LIGHTHEADED: "Do you feel lightheaded?" (e.g., somewhat faint, woozy, weak upon standing)    Yes lightheaded 3. VERTIGO: "Do you feel like either you or the room is spinning or tilting?" (i.e. vertigo)     Yes, intermittently 4. SEVERITY: "How bad is it?"  "Do you feel like you are going to faint?" "Can you stand and walk?"   - MILD: Feels slightly dizzy, but walking normally.   - MODERATE: Feels very unsteady when walking, but not falling; interferes with normal activities (e.g., school, work) .   - SEVERE: Unable to walk without falling, or requires assistance to walk without falling; feels like passing out now.     moderate 5. ONSET:  "When did the dizziness begin?"    08/02/19 afternoon 6. AGGRAVATING FACTORS: "Does anything make it worse?" (e.g., standing, change in head position)     no 7. HEART RATE: "Can you tell me your heart rate?" "How many beats in 15 seconds?"  (Note: not all patients  can do this)     08/03/19 at 1504 20 beats in 15 seconds 8. CAUSE: "What do you think is causing the dizziness?"    Not sure 9. RECURRENT SYMPTOM: "Have you had dizziness before?" If Yes, ask: "When was the last time?" "What happened that time?"    no 10. OTHER SYMPTOMS: "Do you have any other symptoms?" (e.g., fever, chest pain, vomiting, diarrhea, bleeding)      Nausea when the room is spinning on 08/03/19 11. PREGNANCY: "Is there any chance you are pregnant?" "When was your last menstrual period?"      No LMP 07/31/29  Protocols used: DIZZINESS [facility]

## 2019-08-03 NOTE — Patient Instructions (Signed)
Use Dramamine as needed 3-4 times per day.

## 2019-08-03 NOTE — Progress Notes (Signed)
Date:  08/03/2019   Name:  Carly Martin   DOB:  12/23/1985   MRN:  [id number]   Chief Complaint: Dizziness (Started yesterday. This morning was a 15 minute episode. So bad it feels like the room is spinning. Nausea- still feels the room spinning when she closes her eyes. )  Dizziness This is a new problem. The current episode started yesterday. The problem occurs intermittently. The problem has been unchanged. Associated symptoms include nausea. Pertinent negatives include no chest pain, congestion, coughing, sore throat or vomiting.    Lab Results  Component Value Date   CREATININE 0.69 06/04/2019   BUN 11 06/04/2019   NA 135 06/04/2019   K 3.7 06/04/2019   CL 105 06/04/2019   CO2 24 06/04/2019   Lab Results  Component Value Date   CHOL 165 03/18/2015   HDL 36 (L) 03/18/2015   LDLCALC 120 (H) 03/18/2015   TRIG 46 03/18/2015   CHOLHDL 4.6 (H) 03/18/2015   Lab Results  Component Value Date   TSH 1.320 03/03/2016   Lab Results  Component Value Date   HGBA1C 5.6 03/18/2015   Lab Results  Component Value Date   WBC 5.6 06/04/2019   HGB 12.4 06/04/2019   HCT 38.7 06/04/2019   MCV 90.2 06/04/2019   PLT 264 06/04/2019   Lab Results  Component Value Date   ALT 13 06/04/2019   AST 14 (L) 06/04/2019   ALKPHOS 45 06/04/2019   BILITOT 0.7 06/04/2019     Review of Systems  HENT: Positive for postnasal drip and sinus pressure. Negative for congestion and sore throat.   Respiratory: Negative for cough.   Cardiovascular: Negative for chest pain and palpitations.  Gastrointestinal: Positive for nausea. Negative for vomiting.  Neurological: Positive for dizziness. Negative for syncope.    Patient Active Problem List   Diagnosis Date Noted  . Multiple sclerosis (HCC) 07/03/2017  . Right upper quadrant abdominal pain 09/28/2016  . Irritable bowel syndrome with constipation 09/28/2016  . Lightheadedness 03/03/2016  . Numbness and tingling 09/03/2015  . Chronic  tension-type headache, not intractable 09/03/2015  . Sciatica of right side 03/17/2015  . Iron deficiency anemia 03/17/2015  . History of loop electrical excision procedure (LEEP) 09/28/2013    Allergies  Allergen Reactions  . Codeine Shortness Of Breath    Past Surgical History:  Procedure Laterality Date  . CERVICAL BIOPSY  W/ LOOP ELECTRODE EXCISION  2008  . TONSILLECTOMY      Social History   Tobacco Use  . Smoking status: Current Every Day Smoker    Packs/day: 0.25    Years: 10.00    Pack years: 2.50    Types: Cigarettes  . Smokeless tobacco: Never Used  Vaping Use  . Vaping Use: Never used  Substance Use Topics  . Alcohol use: Yes    Alcohol/week: 0.0 standard drinks    Comment: socially  . Drug use: No     Medication list has been reviewed and updated.  Current Meds  Medication Sig  . albuterol (VENTOLIN HFA) 108 (90 Base) MCG/ACT inhaler Inhale 2 puffs into the lungs every 6 (six) hours as needed for wheezing or shortness of breath.  . Ascorbic Acid (VITAMIN C) 1000 MG tablet Take 1,000 mg by mouth daily.  Marland Kitchen buPROPion (WELLBUTRIN XL) 150 MG 24 hr tablet Take 150 mg by mouth daily.   . Cholecalciferol (VITAMIN D3 SUPER STRENGTH) 50 MCG (2000 UT) TABS Take 2 tablets by mouth daily.   Marland Kitchen  diazepam (VALIUM) 2 MG tablet Take 2 mg by mouth 2 (two) times daily.  Marland Kitchen etonogestrel (IMPLANON) 68 MG IMPL implant 1 each by Subdermal route once.  . fluticasone (FLONASE) 50 MCG/ACT nasal spray Place 2 sprays into both nostrils daily. (Patient taking differently: Place 2 sprays into both nostrils as needed. )  . ibuprofen (ADVIL,MOTRIN) 800 MG tablet Take 1 tablet (800 mg total) by mouth every 8 (eight) hours as needed.  . modafinil (PROVIGIL) 100 MG tablet Take 100 mg by mouth daily.   . Multiple Minerals (CALCIUM/MAGNESIUM/ZINC PO) Take 1 tablet by mouth daily.  . Multiple Vitamin (MULTI-VITAMIN) tablet Take 1 tablet by mouth daily.   . riTUXimab in sodium chloride 0.9 % 250  mL Inject 1,000 mg into the vein every 6 (six) months.  Marland Kitchen URINARY HEALTH/CRANBERRY PO Take 1 tablet by mouth daily. With out cranberry    PHQ 2/9 Scores 08/03/2019 07/11/2019 07/26/2018 09/23/2017  PHQ - 2 Score 0 0 0 0  PHQ- 9 Score 0 0 0 6    GAD 7 : Generalized Anxiety Score 08/03/2019 07/11/2019  Nervous, Anxious, on Edge 0 0  Control/stop worrying 0 0  Worry too much - different things 0 0  Trouble relaxing 0 0  Restless 0 0  Easily annoyed or irritable 0 0  Afraid - awful might happen 0 0  Total GAD 7 Score 0 0  Anxiety Difficulty Not difficult at all Not difficult at all    BP Readings from Last 3 Encounters:  08/03/19 108/70  07/11/19 112/68  06/04/19 138/78    Physical Exam Vitals and nursing note reviewed.  Constitutional:      General: She is not in acute distress.    Appearance: She is well-developed.  HENT:     Head: Normocephalic and atraumatic.     Right Ear: Tympanic membrane and ear canal normal.     Left Ear: Tympanic membrane and ear canal normal.     Nose:     Right Sinus: Maxillary sinus tenderness present.     Left Sinus: Maxillary sinus tenderness present.  Eyes:     Extraocular Movements: Extraocular movements intact.     Right eye: Normal extraocular motion and no nystagmus.     Left eye: Normal extraocular motion and no nystagmus.  Cardiovascular:     Rate and Rhythm: Normal rate and regular rhythm.  Pulmonary:     Effort: Pulmonary effort is normal. No respiratory distress.     Breath sounds: No wheezing or rhonchi.  Musculoskeletal:        General: Normal range of motion.     Cervical back: Normal range of motion.  Lymphadenopathy:     Cervical: No cervical adenopathy.  Skin:    General: Skin is warm and dry.     Findings: No rash.  Neurological:     Mental Status: She is alert and oriented to person, place, and time.  Psychiatric:        Behavior: Behavior normal.        Thought Content: Thought content normal.     Wt Readings from  Last 3 Encounters:  08/03/19 221 lb (100.2 kg)  07/11/19 224 lb (101.6 kg)  06/04/19 224 lb 15.7 oz (102 kg)    BP 108/70 (BP Location: Right Arm, Patient Position: Sitting, Cuff Size: Large)   Pulse 80   Temp 97.8 F (36.6 C) (Oral)   Ht 5\' 5"  (1.651 m)   Wt 221 lb (100.2 kg)  LMP 08/01/2019 (Exact Date)   SpO2 98%   BMI 36.78 kg/m   Assessment and Plan: 1. Vertigo Likely viral but she has sinus tenderness and drainage Hydrate well, use otc dramamine as needed for more severe sx  2. Acute non-recurrent maxillary sinusitis - azithromycin (ZITHROMAX Z-PAK) 250 MG tablet; UAD  Dispense: 6 each; Refill: 0   Partially dictated using Animal nutritionist. Any errors are unintentional.  Bari Edward, MD [facility]  08/03/2019

## 2019-08-15 DIAGNOSIS — M21371 Foot drop, right foot: Secondary | ICD-10-CM | POA: Insufficient documentation

## 2019-08-21 ENCOUNTER — Encounter: Payer: Self-pay | Admitting: Internal Medicine

## 2019-08-21 ENCOUNTER — Ambulatory Visit
Admission: RE | Admit: 2019-08-21 | Discharge: 2019-08-21 | Disposition: A | Discharge status: Home / Self Care | Payer: POS | Source: Ambulatory Visit | Attending: Internal Medicine | Admitting: Internal Medicine

## 2019-08-21 ENCOUNTER — Other Ambulatory Visit: Payer: Self-pay

## 2019-08-21 ENCOUNTER — Ambulatory Visit: Payer: POS | Admitting: Internal Medicine

## 2019-08-21 ENCOUNTER — Ambulatory Visit
Admission: RE | Admit: 2019-08-21 | Discharge: 2019-08-21 | Disposition: A | Discharge status: Home / Self Care | Payer: POS | Attending: Internal Medicine | Admitting: Internal Medicine

## 2019-08-21 VITALS — BP 130/82 | HR 90 | Temp 97.7°F | Ht 65.0 in | Wt 220.0 lb

## 2019-08-21 DIAGNOSIS — M79671 Pain in right foot: Secondary | ICD-10-CM

## 2019-08-21 NOTE — Progress Notes (Signed)
Date:  08/21/2019   Name:  Carly Martin   DOB:  01-27-1986   MRN:  [id number]   Chief Complaint: Toe Pain (Taking aleve PRN, but not helping pain. Right toes on foot. Pulse throbbing. Lastnight is whent his happened- running through a door and hit her toe on the door. )  Toe Pain  The incident occurred 12 to 24 hours ago. The incident occurred at home. The injury mechanism was a direct blow. The pain is present in the right toes. The pain is moderate. The pain has been improving since onset. Associated symptoms include an inability to bear weight, a loss of motion (unable to wiggle toes) and tingling. Pertinent negatives include no numbness.    Lab Results  Component Value Date   CREATININE 0.69 06/04/2019   BUN 11 06/04/2019   NA 135 06/04/2019   K 3.7 06/04/2019   CL 105 06/04/2019   CO2 24 06/04/2019   Lab Results  Component Value Date   CHOL 165 03/18/2015   HDL 36 (L) 03/18/2015   LDLCALC 120 (H) 03/18/2015   TRIG 46 03/18/2015   CHOLHDL 4.6 (H) 03/18/2015   Lab Results  Component Value Date   TSH 1.320 03/03/2016   Lab Results  Component Value Date   HGBA1C 5.6 03/18/2015   Lab Results  Component Value Date   WBC 5.6 06/04/2019   HGB 12.4 06/04/2019   HCT 38.7 06/04/2019   MCV 90.2 06/04/2019   PLT 264 06/04/2019   Lab Results  Component Value Date   ALT 13 06/04/2019   AST 14 (L) 06/04/2019   ALKPHOS 45 06/04/2019   BILITOT 0.7 06/04/2019     Review of Systems  Constitutional: Negative for chills and fatigue.  Musculoskeletal: Positive for arthralgias and gait problem. Negative for joint swelling.  Neurological: Positive for tingling. Negative for numbness.    Patient Active Problem List   Diagnosis Date Noted  . Multiple sclerosis (HCC) 07/03/2017  . Right upper quadrant abdominal pain 09/28/2016  . Irritable bowel syndrome with constipation 09/28/2016  . Lightheadedness 03/03/2016  . Numbness and tingling 09/03/2015  . Chronic  tension-type headache, not intractable 09/03/2015  . Sciatica of right side 03/17/2015  . Iron deficiency anemia 03/17/2015  . History of loop electrical excision procedure (LEEP) 09/28/2013    Allergies  Allergen Reactions  . Codeine Shortness Of Breath    Past Surgical History:  Procedure Laterality Date  . CERVICAL BIOPSY  W/ LOOP ELECTRODE EXCISION  2008  . TONSILLECTOMY      Social History   Tobacco Use  . Smoking status: Current Every Day Smoker    Packs/day: 0.25    Years: 10.00    Pack years: 2.50    Types: Cigarettes  . Smokeless tobacco: Never Used  Vaping Use  . Vaping Use: Never used  Substance Use Topics  . Alcohol use: Yes    Alcohol/week: 0.0 standard drinks    Comment: socially  . Drug use: No     Medication list has been reviewed and updated.  Current Meds  Medication Sig  . albuterol (VENTOLIN HFA) 108 (90 Base) MCG/ACT inhaler Inhale 2 puffs into the lungs every 6 (six) hours as needed for wheezing or shortness of breath.  . Ascorbic Acid (VITAMIN C) 1000 MG tablet Take 1,000 mg by mouth daily.  . Cholecalciferol (VITAMIN D3 SUPER STRENGTH) 50 MCG (2000 UT) TABS Take 2 tablets by mouth daily.   . diazepam (VALIUM) 2 MG  tablet Take 2 mg by mouth 2 (two) times daily.  Marland Kitchen etonogestrel (IMPLANON) 68 MG IMPL implant 1 each by Subdermal route once.  . fluticasone (FLONASE) 50 MCG/ACT nasal spray Place 2 sprays into both nostrils daily. (Patient taking differently: Place 2 sprays into both nostrils as needed. )  . ibuprofen (ADVIL,MOTRIN) 800 MG tablet Take 1 tablet (800 mg total) by mouth every 8 (eight) hours as needed.  . modafinil (PROVIGIL) 100 MG tablet Take 100 mg by mouth daily.   . Multiple Minerals (CALCIUM/MAGNESIUM/ZINC PO) Take 1 tablet by mouth daily.  . Multiple Vitamin (MULTI-VITAMIN) tablet Take 1 tablet by mouth daily.   . riTUXimab in sodium chloride 0.9 % 250 mL Inject 1,000 mg into the vein every 6 (six) months.  Marland Kitchen URINARY  HEALTH/CRANBERRY PO Take 1 tablet by mouth daily. With out cranberry    PHQ 2/9 Scores 08/21/2019 08/03/2019 07/11/2019 07/26/2018  PHQ - 2 Score 0 0 0 0  PHQ- 9 Score 0 0 0 0    GAD 7 : Generalized Anxiety Score 08/21/2019 08/03/2019 07/11/2019  Nervous, Anxious, on Edge 0 0 0  Control/stop worrying 0 0 0  Worry too much - different things 0 0 0  Trouble relaxing 0 0 0  Restless 0 0 0  Easily annoyed or irritable 0 0 0  Afraid - awful might happen 0 0 0  Total GAD 7 Score 0 0 0  Anxiety Difficulty Not difficult at all Not difficult at all Not difficult at all    BP Readings from Last 3 Encounters:  08/21/19 130/82  08/03/19 108/70  07/11/19 112/68    Physical Exam Vitals and nursing note reviewed.  Constitutional:      General: She is not in acute distress.    Appearance: She is well-developed.  HENT:     Head: Normocephalic and atraumatic.  Pulmonary:     Effort: Pulmonary effort is normal. No respiratory distress.  Musculoskeletal:     Right foot: Decreased range of motion. Tenderness present. No swelling or crepitus.     Comments: Tender over the third metatarsal bone mid-foot  Skin:    General: Skin is warm and dry.     Findings: No rash.  Neurological:     Mental Status: She is alert and oriented to person, place, and time.  Psychiatric:        Behavior: Behavior normal.        Thought Content: Thought content normal.     Wt Readings from Last 3 Encounters:  08/21/19 220 lb (99.8 kg)  08/03/19 221 lb (100.2 kg)  07/11/19 224 lb (101.6 kg)    BP 130/82   Pulse 90   Temp 97.7 F (36.5 C) (Oral)   Ht 5\' 5"  (1.651 m)   Wt 220 lb (99.8 kg)   LMP 08/01/2019 (Exact Date)   SpO2 100%   BMI 36.61 kg/m   Assessment and Plan: 1. Acute pain of right foot Continue Aleve alternating with Tylenol; elevate; limit weight bearing - DG Foot Complete Right; Future   Partially dictated using Dragon software. Any errors are unintentional.  08/03/2019, MD [facility]  08/21/2019

## 2019-08-22 ENCOUNTER — Encounter: Payer: Self-pay | Admitting: Internal Medicine

## 2019-08-30 ENCOUNTER — Ambulatory Visit: Payer: POS | Admitting: Physical Therapy

## 2019-09-03 ENCOUNTER — Ambulatory Visit: Payer: POS | Attending: Physician Assistant | Admitting: Physical Therapy

## 2019-09-03 ENCOUNTER — Other Ambulatory Visit: Payer: Self-pay

## 2019-09-03 ENCOUNTER — Encounter: Payer: Self-pay | Admitting: Physical Therapy

## 2019-09-03 DIAGNOSIS — R262 Difficulty in walking, not elsewhere classified: Secondary | ICD-10-CM | POA: Diagnosis present

## 2019-09-03 DIAGNOSIS — R278 Other lack of coordination: Secondary | ICD-10-CM | POA: Diagnosis present

## 2019-09-03 DIAGNOSIS — M6281 Muscle weakness (generalized): Secondary | ICD-10-CM | POA: Diagnosis present

## 2019-09-03 NOTE — Therapy (Signed)
[facility] [address] [place], [place], [address] Phone: [phone]   Fax:  [phone]  Physical Therapy Evaluation  Patient Details  Name: Carly Martin MRN: [id number] Date of Birth: [birth year] Referring Provider (PT): [place], [place]   Encounter Date: 09/03/2019   PT End of Session - 09/03/19 1009    Visit Number 1    Number of Visits 8    Date for PT Re-Evaluation 10/29/19    Authorization Type IE: 09/03/2019    PT Start Time 0800    PT Stop Time 0855    PT Time Calculation (min) 55 min    Activity Tolerance Patient tolerated treatment well;Patient limited by fatigue    Behavior During Therapy [facility] for tasks assessed/performed           Past Medical History:  Diagnosis Date  . Asthma     Past Surgical History:  Procedure Laterality Date  . CERVICAL BIOPSY  W/ LOOP ELECTRODE EXCISION  2008  . TONSILLECTOMY      There were no vitals filed for this visit.    Subjective Assessment - 09/03/19 0809    Subjective Patient notes that she is participating to achieve greater function of RLE. Patient notes that she uses a cane when she feels she needs to; it is only her RLE that gives out on her. Patient notes that she can usually tell in the morning if her RLE is going to be more of a problem because it will feel weak and heavy. Patient walks everyday 0.5 miles after which she feels like she is walking in mud because of the heaviness of her RLE. Patient used to ride her bike, but feels too fatigued to participate in bike riding anymore.    Pertinent History MS diagnosed 07/04/2017; 2017 onset of migraines; about 11 years ago patient noted onset of numbness and other symptoms.    How long can you walk comfortably? 0.5 mile    Patient Stated Goals improve walking; return to riding bike              [facility] PT Assessment - 09/03/19 0001      Assessment   Medical Diagnosis MS; right sided weakness    Referring Provider  (PT) Zalasky, Clara    Hand Dominance Right      Balance Screen   Has the patient fallen in the past 6 months Yes    How many times? 2    Has the patient had a decrease in activity level because of a fear of falling?  No    Is the patient reluctant to leave their home because of a fear of falling?  No      Sensation   Light Touch Appears Intact    Proprioception Appears Intact      ROM / Strength   AROM / PROM / Strength Strength      Strength   Strength Assessment Site Ankle;Knee;Hip    Right/Left Hip Right;Left    Right Hip Flexion 3-/5    Right Hip Extension 3+/5    Right Hip ABduction 3-/5    Right Hip ADduction 3-/5    Left Hip Flexion 5/5    Left Hip Extension 5/5    Left Hip ABduction 4/5    Left Hip ADduction 4/5    Right/Left Knee Right;Left    Right Knee Flexion 3+/5    Right Knee Extension 3/5    Left Knee Flexion 5/5  Left Knee Extension 5/5    Right/Left Ankle Right;Left    Right Ankle Dorsiflexion 2+/5    Right Ankle Plantar Flexion 4/5    Right Ankle Inversion 2+/5    Right Ankle Eversion 2+/5    Left Ankle Dorsiflexion 5/5    Left Ankle Plantar Flexion 5/5   sitting   Left Ankle Inversion 5/5    Left Ankle Eversion 5/5      6 minute walk test results    Aerobic Endurance Distance Walked 359   (meters) R toe drag x2         ASSESSMENT  Patient is a 34 year old presenting to clinic with chief complaints of RLE weakness and decreased function 2/2 to MS. Upon examination, patient demonstrates deficits in muscular endurance, RLE strength, balance, gait, and coordination as evidenced by of 359 m with 2 instances of R toe drag during swing phase, RLE gross MMT 3-/5, + Trendelenburg BLE, decreased R hip flexion, R knee extension control, and R dorsiflexion during gait. Patient's responses on FOTO outcome measures (60) indicate moderate functional limitations/disability/distress. Patient's progress may be limited due to the progressive nature of MS;  however, patient's motivation and current health behaviors are advantageous. Patient will benefit from continued skilled therapeutic intervention to address deficits in muscular endurance, RLE strength, balance, gait, and coordination in order to increase function and improve overall QOL.   EDUCATION  Patient educated on prognosis, POC, and provided with HEP including: eccentric dorsiflexion, hip abduction. Patient articulated understanding and returned demonstration. Patient will benefit from further education in order to maximize compliance and understanding for long-term therapeutic gains.    Objective measurements completed on examination: See above findings.                    PT Long Term Goals - 09/03/19 1036      PT LONG TERM GOAL #1   Title Patient will demonstrate independence with HEP in order to maximize therapeutic gains and improve carryover from physical therapy sessions to ADLs in the home and community.    Baseline IE: provided    Time 8    Period Weeks    Status New    Target Date 10/29/19      PT LONG TERM GOAL #2   Title Patient will demonstrate improved function as evidenced by a score of 78 on FOTO measure for full participation in activities at home and in the community.    Baseline IE: 60    Time 8    Period Weeks    Status New    Target Date 10/29/19      PT LONG TERM GOAL #3   Title Patient will increase by at least 64m (142ft) in order to demonstrate clinically significant improvement in cardiopulmonary endurance and community ambulation.    Baseline IE: 359 m    Time 8    Period Weeks    Status New    Target Date 10/29/19      PT LONG TERM GOAL #4   Title Patient will increase RLE strength by 1/2 grade on MMT to demonstrate clinically significant improvement in strength for improved participation in community activities.    Baseline IE: 3-/5 gross    Time 8    Period Weeks    Status New    Target Date 10/29/19                   Plan - 09/03/19 1010  Clinical Impression Statement Patient is a 35 year old presenting to clinic with chief complaints of RLE weakness and decreased function 2/2 to MS. Upon examination, patient demonstrates deficits in muscular endurance, RLE strength, balance, gait, and coordination as evidenced by of 359 m with 2 instances of R toe drag during swing phase, RLE gross MMT 3-/5, + Trendelenburg BLE, decreased R hip flexion, R knee extension control, and R dorsiflexion during gait. Patient's responses on FOTO outcome measures (60) indicate moderate functional limitations/disability/distress. Patient's progress may be limited due to the progressive nature of MS; however, patient's motivation and current health behaviors are advantageous. Patient will benefit from continued skilled therapeutic intervention to address deficits in muscular endurance, RLE strength, balance, gait, and coordination in order to increase function and improve overall QOL.    Personal Factors and Comorbidities Age;Comorbidity 3+;Fitness;Past/Current Experience;Time since onset of injury/illness/exacerbation    Comorbidities IBS-C, anemia, asthma    Examination-Activity Limitations Transfers;Squat;Lift;Locomotion Level;Stairs    Examination-Participation Restrictions Cleaning;Yard Work;Laundry;Community Activity;Shop    Stability/Clinical Decision Making Evolving/Moderate complexity    Clinical Decision Making Moderate    Rehab Potential Fair    PT Frequency 1x / week    PT Duration 8 weeks    PT Treatment/Interventions ADLs/Self Care Home Management;Aquatic Therapy;Moist Heat;Cryotherapy;Electrical Stimulation;Therapeutic activities;Functional mobility training;Stair training;Gait training;Therapeutic exercise;Balance training;Neuromuscular re-education;Scar mobilization;Taping;Orthotic Fit/Training;Patient/family education;Manual techniques;Passive range of motion;Spinal Manipulations;Joint  Manipulations;DME Instruction    PT Next Visit Plan balance, strengthening    PT Home Exercise Plan eccentric DF, hip abduction    Consulted and Agree with Plan of Care Patient           Patient will benefit from skilled therapeutic intervention in order to improve the following deficits and impairments:  Abnormal gait, Decreased balance, Decreased endurance, Decreased mobility, Difficulty walking, Improper body mechanics, Impaired flexibility, Decreased strength, Decreased coordination, Decreased activity tolerance  Visit Diagnosis: Muscle weakness (generalized)  Other lack of coordination  Difficulty in walking, not elsewhere classified     Problem List Patient Active Problem List   Diagnosis Date Noted  . Multiple sclerosis (HCC) 07/03/2017  . Right upper quadrant abdominal pain 09/28/2016  . Irritable bowel syndrome with constipation 09/28/2016  . Lightheadedness 03/03/2016  . Numbness and tingling 09/03/2015  . Chronic tension-type headache, not intractable 09/03/2015  . Sciatica of right side 03/17/2015  . Iron deficiency anemia 03/17/2015  . History of loop electrical excision procedure (LEEP) 09/28/2013   Sheria Lang PT, DPT [phone] 09/03/2019, 10:38 AM  [facility] [address] [place], [place], [address] Phone: [phone]   Fax:  [phone]  Name: Carly Martin MRN: [id number] Date of Birth: [birth year]

## 2019-09-10 ENCOUNTER — Encounter: Payer: Self-pay | Admitting: Physical Therapy

## 2019-09-10 ENCOUNTER — Ambulatory Visit: Payer: POS | Admitting: Physical Therapy

## 2019-09-10 ENCOUNTER — Other Ambulatory Visit: Payer: Self-pay

## 2019-09-10 DIAGNOSIS — M6281 Muscle weakness (generalized): Secondary | ICD-10-CM

## 2019-09-10 DIAGNOSIS — R262 Difficulty in walking, not elsewhere classified: Secondary | ICD-10-CM

## 2019-09-10 DIAGNOSIS — R278 Other lack of coordination: Secondary | ICD-10-CM

## 2019-09-10 NOTE — Therapy (Signed)
[facility] [address] [place], [place], [address] Phone: [phone]   Fax:  [phone]  Physical Therapy Treatment  Patient Details  Name: Carly Martin MRN: [id number] Date of Birth: Sep 03, 1985 Referring Provider (PT): [place], [place]   Encounter Date: 09/10/2019   PT End of Session - 09/10/19 2706    Visit Number 2    Number of Visits 8    Date for PT Re-Evaluation 10/29/19    Authorization Type IE: 09/03/2019    PT Start Time 0803    PT Stop Time 0903    PT Time Calculation (min) 60 min    Activity Tolerance Patient tolerated treatment well;Patient limited by fatigue    Behavior During Therapy [facility] for tasks assessed/performed           Past Medical History:  Diagnosis Date   Asthma     Past Surgical History:  Procedure Laterality Date   CERVICAL BIOPSY  W/ LOOP ELECTRODE EXCISION  2008   TONSILLECTOMY      There were no vitals filed for this visit.   Subjective Assessment - 09/10/19 0918    Subjective Patient reports no significant changes since evaluation. She does note that she is easily able to fit in banded hip abduction while working with either resistance band for 30 reps multiple times during the day. She has been working on her eccentric dorsiflexion and reports high level of RLE fatigue, but was able to get 20 reps in albeit not for multiple sets throughout the day. Additionally, patient states that she has become more aware of the amount of focus she requires for maintaining her form when walking; she adds that she can walk and talk but has an increased frequency of tripping.    Pertinent History MS diagnosed 07/04/2017; 2017 onset of migraines; about 11 years ago patient noted onset of numbness and other symptoms.    How long can you walk comfortably? 0.5 mile    Patient Stated Goals improve walking; return to riding bike    Currently in Pain? No/denies          TREATMENT  Manual Therapy: STM  and TPR performed to R foot to allow for decreased tension and pain and improved posture and function Gentle metatarsal mobilizations with movement for improved mobility, grade II/III, R foot  Neuromuscular Re-education: Supine R toe flexion x30 for improved motor control at R foot Supine R toe extension x30 for improved motor control at R foot Seated R lesser toe extension x30 with mirror feedback and tactile cues for improved motor control at R foot Seated R great toe extension x30 with mirror feedback and tactile cues for improved motor control at R foot Supine hip strengthening for improved postural control during gait:  Bridge with GTB hip abduction, B, x15  Bridge with GTB hip abduction, R, x15  Bridge with R b-stance bias, x15  RLE bridge x8  LLE bridge x15   Patient educated throughout session on appropriate technique and form using multi-modal cueing, HEP, and activity modification. Patient articulated understanding and returned demonstration.  Patient Response to interventions: Reports confidence with new HEP additions.  ASSESSMENT Patient presents to clinic with excellent motivation to participate in therapy. Patient demonstrates deficits in muscular endurance, RLE strength, balance, gait, and coordination. Patient able to achieve active ROM activities at R foot with increased repetition and mirror feedback during today's session and responded positively to active interventions. Patient will benefit from continued skilled therapeutic intervention  to address remaining deficits in muscular endurance, RLE strength, balance, gait, and coordination in order to increase function and improve overall QOL.     PT Long Term Goals - 09/03/19 1036      PT LONG TERM GOAL #1   Title Patient will demonstrate independence with HEP in order to maximize therapeutic gains and improve carryover from physical therapy sessions to ADLs in the home and community.    Baseline IE: provided    Time 8     Period Weeks    Status New    Target Date 10/29/19      PT LONG TERM GOAL #2   Title Patient will demonstrate improved function as evidenced by a score of 78 on FOTO measure for full participation in activities at home and in the community.    Baseline IE: 60    Time 8    Period Weeks    Status New    Target Date 10/29/19      PT LONG TERM GOAL #3   Title Patient will increase by at least 68m (182ft) in order to demonstrate clinically significant improvement in cardiopulmonary endurance and community ambulation.    Baseline IE: 359 m    Time 8    Period Weeks    Status New    Target Date 10/29/19      PT LONG TERM GOAL #4   Title Patient will increase RLE strength by 1/2 grade on MMT to demonstrate clinically significant improvement in strength for improved participation in community activities.    Baseline IE: 3-/5 gross    Time 8    Period Weeks    Status New    Target Date 10/29/19                 Plan - 09/10/19 4656    Clinical Impression Statement Patient presents to clinic with excellent motivation to participate in therapy. Patient demonstrates deficits in muscular endurance, RLE strength, balance, gait, and coordination. Patient able to achieve active ROM activities at R foot with increased repetition and mirror feedback during today's session and responded positively to active interventions. Patient will benefit from continued skilled therapeutic intervention to address remaining deficits in muscular endurance, RLE strength, balance, gait, and coordination in order to increase function and improve overall QOL.    Personal Factors and Comorbidities Age;Comorbidity 3+;Fitness;Past/Current Experience;Time since onset of injury/illness/exacerbation    Comorbidities IBS-C, anemia, asthma    Examination-Activity Limitations Transfers;Squat;Lift;Locomotion Level;Stairs    Examination-Participation Restrictions Cleaning;Yard Work;Laundry;Community Activity;Shop     Stability/Clinical Decision Making Evolving/Moderate complexity    Rehab Potential Fair    PT Frequency 1x / week    PT Duration 8 weeks    PT Treatment/Interventions ADLs/Self Care Home Management;Aquatic Therapy;Moist Heat;Cryotherapy;Electrical Stimulation;Therapeutic activities;Functional mobility training;Stair training;Gait training;Therapeutic exercise;Balance training;Neuromuscular re-education;Scar mobilization;Taping;Orthotic Fit/Training;Patient/family education;Manual techniques;Passive range of motion;Spinal Manipulations;Joint Manipulations;DME Instruction    PT Next Visit Plan balance, strengthening    PT Home Exercise Plan eccentric DF, hip abduction    Consulted and Agree with Plan of Care Patient           Patient will benefit from skilled therapeutic intervention in order to improve the following deficits and impairments:  Abnormal gait, Decreased balance, Decreased endurance, Decreased mobility, Difficulty walking, Improper body mechanics, Impaired flexibility, Decreased strength, Decreased coordination, Decreased activity tolerance  Visit Diagnosis: Muscle weakness (generalized)  Difficulty in walking, not elsewhere classified  Other lack of coordination     Problem List Patient Active Problem  List   Diagnosis Date Noted   Multiple sclerosis (HCC) 07/03/2017   Right upper quadrant abdominal pain 09/28/2016   Irritable bowel syndrome with constipation 09/28/2016   Lightheadedness 03/03/2016   Numbness and tingling 09/03/2015   Chronic tension-type headache, not intractable 09/03/2015   Sciatica of right side 03/17/2015   Iron deficiency anemia 03/17/2015   History of loop electrical excision procedure (LEEP) 09/28/2013   Sheria Lang PT, DPT [phone] 09/10/2019, 9:48 AM  [facility] [address] [place], [place], [address] Phone: [phone]   Fax:  [phone]  Name: KRYSTI HICKLING MRN:  [id number] Date of Birth: 09-19-1985

## 2019-09-20 ENCOUNTER — Ambulatory Visit: Payer: POS | Admitting: Physical Therapy

## 2019-09-20 ENCOUNTER — Encounter: Payer: Self-pay | Admitting: Physical Therapy

## 2019-09-20 DIAGNOSIS — M6281 Muscle weakness (generalized): Secondary | ICD-10-CM | POA: Diagnosis not present

## 2019-09-20 DIAGNOSIS — R278 Other lack of coordination: Secondary | ICD-10-CM

## 2019-09-20 DIAGNOSIS — R262 Difficulty in walking, not elsewhere classified: Secondary | ICD-10-CM

## 2019-09-20 NOTE — Therapy (Signed)
[place] [facility] [address] [place], [place], [address] Phone: [phone]   Fax:  [phone]  Physical Therapy Treatment  Patient Details  Name: Carly Martin MRN: [id number] Date of Birth: 12/17/1985 Referring Provider (PT): [place], [place]   Encounter Date: 09/20/2019   PT End of Session - 09/20/19 0819    Visit Number 3    Number of Visits 8    Date for PT Re-Evaluation 10/29/19    Authorization Type IE: 09/03/2019    PT Start Time 0812    PT Stop Time 0855    PT Time Calculation (min) 43 min    Activity Tolerance Patient tolerated treatment well;Patient limited by fatigue    Behavior During Therapy [facility] for tasks assessed/performed           Past Medical History:  Diagnosis Date  . Asthma     Past Surgical History:  Procedure Laterality Date  . CERVICAL BIOPSY  W/ LOOP ELECTRODE EXCISION  2008  . TONSILLECTOMY      There were no vitals filed for this visit.   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 She notes that she feels she might be coming up on an MS flare; she has noted that when she does her daily walk she is unable to walk her typical distance at her typical pace. When she completes her walk, she hsa tingling and extreme fatigue throughout the RLE. Patient notes this morning that she is feeling good but has not done much to fatigue her body. Patient does note that with her walk yesterday despite symptoms and decreased pace and distance, she only caught her R toe once and maintained balance.    Pertinent History MS diagnosed 07/04/2017; 2017 onset of migraines; about 11 years ago patient noted onset of numbness and other symptoms.    How long can you walk comfortably? 0.5 mile    Patient Stated Goals improve walking; return to riding bike    Currently in Pain? No/denies            TREATMENT  Neuromuscular Re-education: Standing RLE, 8" step taps for improved hip flexion and foot clearance during gait/ADLs. VCs for posture. 2x20 Standing RLE, 8" lateral step taps for improved hip flexion and foot clearance during gait/ADLs. VCs for posture. 2x20 Patient education/discussion on energy conservation and AFO use.    Patient educated throughout session on appropriate technique and form using multi-modal cueing, HEP, and activity modification. Patient articulated understanding and returned demonstration.  Patient Response to interventions: Decreased R ankle DF AROM at end of session.  ASSESSMENT Patient presents to clinic with excellent motivation to participate in therapy. Patient demonstrates deficits in muscular endurance, RLE strength, balance, gait, and coordination. Patient able to perform 8" step taps with good clearance consistency ascending during today's session and responded positively to active interventions. Patient will benefit from continued skilled therapeutic intervention to address remaining deficits in muscular endurance, RLE strength, balance, gait, and coordination in order to increase function and improve overall QOL.       PT Long Term Goals - 09/03/19 1036      PT LONG TERM GOAL #1   Title Patient will demonstrate independence with HEP in order to maximize therapeutic gains and improve carryover from physical therapy sessions to ADLs in the home and community.    Baseline IE: provided  Time 8    Period Weeks    Status New    Target Date 10/29/19      PT LONG TERM GOAL #2   Title Patient will demonstrate improved function as evidenced by a score of 78 on FOTO measure for full participation in activities at home and in the community.    Baseline IE: 60    Time 8    Period Weeks    Status New    Target Date 10/29/19      PT LONG TERM GOAL #3   Title Patient will increase by at least 16m (124ft) in order to demonstrate  clinically significant improvement in cardiopulmonary endurance and community ambulation.    Baseline IE: 359 m    Time 8    Period Weeks    Status New    Target Date 10/29/19      PT LONG TERM GOAL #4   Title Patient will increase RLE strength by 1/2 grade on MMT to demonstrate clinically significant improvement in strength for improved participation in community activities.    Baseline IE: 3-/5 gross    Time 8    Period Weeks    Status New    Target Date 10/29/19                 Plan - 09/20/19 0819    Clinical Impression Statement Patient presents to clinic with excellent motivation to participate in therapy. Patient demonstrates deficits in muscular endurance, RLE strength, balance, gait, and coordination. Patient able to perform 8" step taps with good clearance consistency ascending during today's session and responded positively to active interventions. Patient will benefit from continued skilled therapeutic intervention to address remaining deficits in muscular endurance, RLE strength, balance, gait, and coordination in order to increase function and improve overall QOL.    Personal Factors and Comorbidities Age;Comorbidity 3+;Fitness;Past/Current Experience;Time since onset of injury/illness/exacerbation    Comorbidities IBS-C, anemia, asthma    Examination-Activity Limitations Transfers;Squat;Lift;Locomotion Level;Stairs    Examination-Participation Restrictions Cleaning;Yard Work;Laundry;Community Activity;Shop    Stability/Clinical Decision Making Evolving/Moderate complexity    Rehab Potential Fair    PT Frequency 1x / week    PT Duration 8 weeks    PT Treatment/Interventions ADLs/Self Care Home Management;Aquatic Therapy;Moist Heat;Cryotherapy;Electrical Stimulation;Therapeutic activities;Functional mobility training;Stair training;Gait training;Therapeutic exercise;Balance training;Neuromuscular re-education;Scar mobilization;Taping;Orthotic Fit/Training;Patient/family  education;Manual techniques;Passive range of motion;Spinal Manipulations;Joint Manipulations;DME Instruction    PT Next Visit Plan balance, strengthening    PT Home Exercise Plan eccentric DF, hip abduction    Consulted and Agree with Plan of Care Patient           Patient will benefit from skilled therapeutic intervention in order to improve the following deficits and impairments:  Abnormal gait, Decreased balance, Decreased endurance, Decreased mobility, Difficulty walking, Improper body mechanics, Impaired flexibility, Decreased strength, Decreased coordination, Decreased activity tolerance  Visit Diagnosis: Muscle weakness (generalized)  Difficulty in walking, not elsewhere classified  Other lack of coordination     Problem List Patient Active Problem List   Diagnosis Date Noted  . Multiple sclerosis (HCC) 07/03/2017  . Right upper quadrant abdominal pain 09/28/2016  . Irritable bowel syndrome with constipation 09/28/2016  . Lightheadedness 03/03/2016  . Numbness and tingling 09/03/2015  . Chronic tension-type headache, not intractable 09/03/2015  . Sciatica of right side 03/17/2015  . Iron deficiency anemia 03/17/2015  . History of loop electrical excision procedure (LEEP) 09/28/2013   Sheria Lang PT, DPT [phone] 09/20/2019, 12:09 PM  [place] [address] [place], [place], [address] Phone: [phone]   Fax:  [phone]  Name: ZAMYIAH TINO MRN: [id number] Date of Birth: 1985/04/10

## 2019-09-24 ENCOUNTER — Other Ambulatory Visit: Payer: Self-pay

## 2019-09-24 ENCOUNTER — Encounter: Payer: Self-pay | Admitting: Physical Therapy

## 2019-09-24 ENCOUNTER — Ambulatory Visit: Payer: POS | Admitting: Physical Therapy

## 2019-09-24 DIAGNOSIS — M6281 Muscle weakness (generalized): Secondary | ICD-10-CM | POA: Diagnosis not present

## 2019-09-24 DIAGNOSIS — R262 Difficulty in walking, not elsewhere classified: Secondary | ICD-10-CM

## 2019-09-24 DIAGNOSIS — R278 Other lack of coordination: Secondary | ICD-10-CM

## 2019-09-24 NOTE — Therapy (Signed)
[facility] [address] [place], [place], [address] Phone: [phone]   Fax:  [phone]  Physical Therapy Treatment  Patient Details  Name: Carly Martin MRN: [id number] Date of Birth: 07/25/1985 Referring Provider (PT): [place], [place]   Encounter Date: 09/24/2019   PT End of Session - 09/24/19 0953    Visit Number 4    Number of Visits 8    Date for PT Re-Evaluation 10/29/19    Authorization Type IE: 09/03/2019    PT Start Time 0857    PT Stop Time 0951    PT Time Calculation (min) 54 min    Activity Tolerance Patient tolerated treatment well;Patient limited by fatigue    Behavior During Therapy [facility] for tasks assessed/performed           Past Medical History:  Diagnosis Date  . Asthma     Past Surgical History:  Procedure Laterality Date  . CERVICAL BIOPSY  W/ LOOP ELECTRODE EXCISION  2008  . TONSILLECTOMY      There were no vitals filed for this visit.   Subjective Assessment - 09/24/19 0901    Subjective Patient notes that she has been busy with getting her kids ready for the first day of school. Patient was worn out from school shopping. Patient did steps and walk on Sunday which she feels went well.    Pertinent History MS diagnosed 07/04/2017; 2017 onset of migraines; about 11 years ago patient noted onset of numbness and other symptoms.    How long can you walk comfortably? 0.5 mile    Patient Stated Goals improve walking; return to riding bike    Currently in Pain? No/denies           TREATMENT  Neuromuscular Re-education: Standing Pilates Postural Control Hug a Tree, BTB, x10 b/l and u/l Serve a Tray, BTB, x10 b/l and u/l Shaving/Salute, BTB, x10, b/l and u/l Patient education on importance of postural control to conserve energy and preserve BLE joints. Patient education on OTC AFO and ankle braces for improved R ankle stability until appointment with orthotist.  Patient educated throughout  session on appropriate technique and form using multi-modal cueing, HEP, and activity modification. Patient articulated understanding and returned demonstration.  Patient Response to interventions: Patient reports confidence with postural control activities  ASSESSMENT Patient presents to clinic with excellent motivation to participate in therapy. Patient demonstrates deficits in muscular endurance, RLE strength, balance, gait, and coordination. Patient coordinating good postural control with anti-extension activities during today's session and responded positively to active interventions. Patient will benefit from continued skilled therapeutic intervention to address remaining deficits in muscular endurance, RLE strength, balance, gait, and coordination in order to increase function and improve overall QOL.     PT Long Term Goals - 09/03/19 1036      PT LONG TERM GOAL #1   Title Patient will demonstrate independence with HEP in order to maximize therapeutic gains and improve carryover from physical therapy sessions to ADLs in the home and community.    Baseline IE: provided    Time 8    Period Weeks    Status New    Target Date 10/29/19      PT LONG TERM GOAL #2   Title Patient will demonstrate improved function as evidenced by a score of 78 on FOTO measure for full participation in activities at home and in the community.    Baseline IE: 60    Time 8  Period Weeks    Status New    Target Date 10/29/19      PT LONG TERM GOAL #3   Title Patient will increase by at least 79m (153ft) in order to demonstrate clinically significant improvement in cardiopulmonary endurance and community ambulation.    Baseline IE: 359 m    Time 8    Period Weeks    Status New    Target Date 10/29/19      PT LONG TERM GOAL #4   Title Patient will increase RLE strength by 1/2 grade on MMT to demonstrate clinically significant improvement in strength for improved participation in community  activities.    Baseline IE: 3-/5 gross    Time 8    Period Weeks    Status New    Target Date 10/29/19                 Plan - 09/24/19 0956    Clinical Impression Statement Patient presents to clinic with excellent motivation to participate in therapy. Patient demonstrates deficits in muscular endurance, RLE strength, balance, gait, and coordination. Patient coordinating good postural control with anti-extension activities during today's session and responded positively to active interventions. Patient will benefit from continued skilled therapeutic intervention to address remaining deficits in muscular endurance, RLE strength, balance, gait, and coordination in order to increase function and improve overall QOL.    Personal Factors and Comorbidities Age;Comorbidity 3+;Fitness;Past/Current Experience;Time since onset of injury/illness/exacerbation    Comorbidities IBS-C, anemia, asthma    Examination-Activity Limitations Transfers;Squat;Lift;Locomotion Level;Stairs    Examination-Participation Restrictions Cleaning;Yard Work;Laundry;Community Activity;Shop    Stability/Clinical Decision Making Evolving/Moderate complexity    Rehab Potential Fair    PT Frequency 1x / week    PT Duration 8 weeks    PT Treatment/Interventions ADLs/Self Care Home Management;Aquatic Therapy;Moist Heat;Cryotherapy;Electrical Stimulation;Therapeutic activities;Functional mobility training;Stair training;Gait training;Therapeutic exercise;Balance training;Neuromuscular re-education;Scar mobilization;Taping;Orthotic Fit/Training;Patient/family education;Manual techniques;Passive range of motion;Spinal Manipulations;Joint Manipulations;DME Instruction    PT Next Visit Plan balance, strengthening    PT Home Exercise Plan eccentric DF, hip abduction    Consulted and Agree with Plan of Care Patient           Patient will benefit from skilled therapeutic intervention in order to improve the following deficits  and impairments:  Abnormal gait, Decreased balance, Decreased endurance, Decreased mobility, Difficulty walking, Improper body mechanics, Impaired flexibility, Decreased strength, Decreased coordination, Decreased activity tolerance  Visit Diagnosis: Muscle weakness (generalized)  Difficulty in walking, not elsewhere classified  Other lack of coordination     Problem List Patient Active Problem List   Diagnosis Date Noted  . Multiple sclerosis (HCC) 07/03/2017  . Right upper quadrant abdominal pain 09/28/2016  . Irritable bowel syndrome with constipation 09/28/2016  . Lightheadedness 03/03/2016  . Numbness and tingling 09/03/2015  . Chronic tension-type headache, not intractable 09/03/2015  . Sciatica of right side 03/17/2015  . Iron deficiency anemia 03/17/2015  . History of loop electrical excision procedure (LEEP) 09/28/2013   Sheria Lang PT, DPT [phone]  09/24/2019, 12:39 PM  [place] [facility] [address] [place], [place], [address] Phone: [phone]   Fax:  [phone]  Name: Carly Martin MRN: [id number] Date of Birth: [birth year]

## 2019-10-01 ENCOUNTER — Ambulatory Visit: Payer: POS | Admitting: Physical Therapy

## 2019-10-09 ENCOUNTER — Ambulatory Visit: Payer: POS | Attending: Physician Assistant | Admitting: Physical Therapy

## 2019-10-09 DIAGNOSIS — R278 Other lack of coordination: Secondary | ICD-10-CM | POA: Insufficient documentation

## 2019-10-09 DIAGNOSIS — M6281 Muscle weakness (generalized): Secondary | ICD-10-CM | POA: Insufficient documentation

## 2019-10-09 DIAGNOSIS — R262 Difficulty in walking, not elsewhere classified: Secondary | ICD-10-CM | POA: Insufficient documentation

## 2019-10-15 ENCOUNTER — Other Ambulatory Visit: Payer: Self-pay

## 2019-10-15 ENCOUNTER — Encounter: Payer: Self-pay | Admitting: Physical Therapy

## 2019-10-15 ENCOUNTER — Ambulatory Visit: Payer: POS | Admitting: Physical Therapy

## 2019-10-15 DIAGNOSIS — R278 Other lack of coordination: Secondary | ICD-10-CM

## 2019-10-15 DIAGNOSIS — R262 Difficulty in walking, not elsewhere classified: Secondary | ICD-10-CM | POA: Diagnosis present

## 2019-10-15 DIAGNOSIS — M6281 Muscle weakness (generalized): Secondary | ICD-10-CM | POA: Diagnosis present

## 2019-10-15 NOTE — Therapy (Signed)
[facility] [address] [place], [place], [address] Phone: [phone]   Fax:  [phone]  Physical Therapy Treatment  Patient Details  Name: Carly Martin MRN: [id number] Date of Birth: 1985/07/17 Referring Provider (PT): [place], [place]   Encounter Date: 10/15/2019   PT End of Session - 10/15/19 0917    Visit Number 5    Number of Visits 8    Date for PT Re-Evaluation 10/29/19    Authorization Type IE: 09/03/2019    PT Start Time 0905    PT Stop Time 1000    PT Time Calculation (min) 55 min    Activity Tolerance Patient tolerated treatment well;Patient limited by fatigue    Behavior During Therapy [facility] for tasks assessed/performed           Past Medical History:  Diagnosis Date  . Asthma     Past Surgical History:  Procedure Laterality Date  . CERVICAL BIOPSY  W/ LOOP ELECTRODE EXCISION  2008  . TONSILLECTOMY      There were no vitals filed for this visit.   Subjective Assessment - 10/15/19 0914    Subjective Patient reports that she had a successful appointment with the orthotist and will be receving her AFO on 10/31/2019. Patient notes that since her last appointment she has only had 1 bad day which was a stressful day overall. Patient notes she feels comfortable with her HEP.    Pertinent History MS diagnosed 07/04/2017; 2017 onset of migraines; about 11 years ago patient noted onset of numbness and other symptoms.    How long can you walk comfortably? 0.5 mile    Patient Stated Goals improve walking; return to riding bike    Currently in Pain? No/denies          TREATMENT  Neuromuscular Re-education: Patient discussion on energy conservation principles and grading activity based on daily energy levels. Patient amenable to logging/tracking AM and PM energy levels for improved selection of daily activities. Postural stretches for nervous system downtraining prior to bed:  Supine hooklying trunk rotations with  diaphragmatic breath for decreased pain  Supine single knee to chest with diaphragmatic breath for decreased pain, BLE  Supine double knee to chest with diaphragmatic breath for decreased pain  Supine figure four stretch with diaphragmatic breath for decreased pain  Supine trapezius stretch with diaphragmatic breath for decreased pain    Patient educated throughout session on appropriate technique and form using multi-modal cueing, HEP, and activity modification. Patient articulated understanding and returned demonstration.  Patient Response to interventions: Patient reports comfort with pre-bed stretch routine and monitoring energy levels  ASSESSMENT Patient presents to clinic with excellent motivation to participate in therapy. Patient demonstrates deficits in muscular endurance, RLE strength, balance, gait, and coordination. Patient keenly self-aware and articulating barriers to progress including difficulty conserving/expending energy in sustainable/managable ways during today's session and responded positively to active interventions. Patient will benefit from continued skilled therapeutic intervention to address remaining deficits in muscular endurance, RLE strength, balance, gait, and coordination in order to increase function and improve overall QOL.         PT Long Term Goals - 09/03/19 1036      PT LONG TERM GOAL #1   Title Patient will demonstrate independence with HEP in order to maximize therapeutic gains and improve carryover from physical therapy sessions to ADLs in the home and community.    Baseline IE: provided    Time 8    Period Weeks  Status New    Target Date 10/29/19      PT LONG TERM GOAL #2   Title Patient will demonstrate improved function as evidenced by a score of 78 on FOTO measure for full participation in activities at home and in the community.    Baseline IE: 60    Time 8    Period Weeks    Status New    Target Date 10/29/19      PT LONG TERM  GOAL #3   Title Patient will increase by at least 29m (133ft) in order to demonstrate clinically significant improvement in cardiopulmonary endurance and community ambulation.    Baseline IE: 359 m    Time 8    Period Weeks    Status New    Target Date 10/29/19      PT LONG TERM GOAL #4   Title Patient will increase RLE strength by 1/2 grade on MMT to demonstrate clinically significant improvement in strength for improved participation in community activities.    Baseline IE: 3-/5 gross    Time 8    Period Weeks    Status New    Target Date 10/29/19                 Plan - 10/15/19 1191    Clinical Impression Statement Patient presents to clinic with excellent motivation to participate in therapy. Patient demonstrates deficits in muscular endurance, RLE strength, balance, gait, and coordination. Patient keenly self-aware and articulating barriers to progress including difficulty conserving/expending energy in sustainable/managable ways during today's session and responded positively to active interventions. Patient will benefit from continued skilled therapeutic intervention to address remaining deficits in muscular endurance, RLE strength, balance, gait, and coordination in order to increase function and improve overall QOL.    Personal Factors and Comorbidities Age;Comorbidity 3+;Fitness;Past/Current Experience;Time since onset of injury/illness/exacerbation    Comorbidities IBS-C, anemia, asthma    Examination-Activity Limitations Transfers;Squat;Lift;Locomotion Level;Stairs    Examination-Participation Restrictions Cleaning;Yard Work;Laundry;Community Activity;Shop    Stability/Clinical Decision Making Evolving/Moderate complexity    Rehab Potential Fair    PT Frequency 1x / week    PT Duration 8 weeks    PT Treatment/Interventions ADLs/Self Care Home Management;Aquatic Therapy;Moist Heat;Cryotherapy;Electrical Stimulation;Therapeutic activities;Functional mobility  training;Stair training;Gait training;Therapeutic exercise;Balance training;Neuromuscular re-education;Scar mobilization;Taping;Orthotic Fit/Training;Patient/family education;Manual techniques;Passive range of motion;Spinal Manipulations;Joint Manipulations;DME Instruction    PT Next Visit Plan balance, strengthening    PT Home Exercise Plan eccentric DF, hip abduction    Consulted and Agree with Plan of Care Patient           Patient will benefit from skilled therapeutic intervention in order to improve the following deficits and impairments:  Abnormal gait, Decreased balance, Decreased endurance, Decreased mobility, Difficulty walking, Improper body mechanics, Impaired flexibility, Decreased strength, Decreased coordination, Decreased activity tolerance  Visit Diagnosis: Muscle weakness (generalized)  Difficulty in walking, not elsewhere classified  Other lack of coordination     Problem List Patient Active Problem List   Diagnosis Date Noted  . Multiple sclerosis (HCC) 07/03/2017  . Right upper quadrant abdominal pain 09/28/2016  . Irritable bowel syndrome with constipation 09/28/2016  . Lightheadedness 03/03/2016  . Numbness and tingling 09/03/2015  . Chronic tension-type headache, not intractable 09/03/2015  . Sciatica of right side 03/17/2015  . Iron deficiency anemia 03/17/2015  . History of loop electrical excision procedure (LEEP) 09/28/2013   Sheria Lang PT, DPT [phone]  10/15/2019, 3:12 PM  [place] [facility] [address] [place], [place], [address] Phone: [phone]   Fax:  [phone]  Name: Carly Martin MRN: [id number] Date of Birth: March 13, 1985

## 2019-10-23 ENCOUNTER — Encounter: Payer: Self-pay | Admitting: Physical Therapy

## 2019-10-23 ENCOUNTER — Ambulatory Visit: Payer: POS | Admitting: Physical Therapy

## 2019-10-23 ENCOUNTER — Other Ambulatory Visit: Payer: Self-pay

## 2019-10-23 DIAGNOSIS — R262 Difficulty in walking, not elsewhere classified: Secondary | ICD-10-CM

## 2019-10-23 DIAGNOSIS — R278 Other lack of coordination: Secondary | ICD-10-CM

## 2019-10-23 DIAGNOSIS — M6281 Muscle weakness (generalized): Secondary | ICD-10-CM

## 2019-10-23 NOTE — Therapy (Signed)
[place] [facility] [address] [place], [place], [address] Phone: [phone]   Fax:  [phone]  Physical Therapy Treatment  Patient Details  Name: Carly Martin MRN: [id number] Date of Birth: 1985-02-08 Referring Provider (PT): [place], [place]   Encounter Date: 10/23/2019   PT End of Session - 10/23/19 0907    Visit Number 6    Number of Visits 8    Date for PT Re-Evaluation 10/29/19    Authorization Type IE: 09/03/2019    PT Start Time 0900    PT Stop Time 0955    PT Time Calculation (min) 55 min    Activity Tolerance Patient tolerated treatment well;Patient limited by fatigue    Behavior During Therapy [facility] for tasks assessed/performed           Past Medical History:  Diagnosis Date  . Asthma     Past Surgical History:  Procedure Laterality Date  . CERVICAL BIOPSY  W/ LOOP ELECTRODE EXCISION  2008  . TONSILLECTOMY      There were no vitals filed for this visit.   Subjective Assessment - 10/23/19 0903    Subjective Patient notes that she is feeling stressed as her wedding approaches. Patient denies any increased symptoms or falls since last session.    Pertinent History MS diagnosed 07/04/2017; 2017 onset of migraines; about 11 years ago patient noted onset of numbness and other symptoms.    How long can you walk comfortably? 0.5 mile    Patient Stated Goals improve walking; return to riding bike    Currently in Pain? No/denies           TREATMENT  Neuromuscular Re-education: Reassessed goals: see below.  Patient education on gradual exposure to heel lift in L shoe and AFO on RLE for improved gait mechanics and decreased postural compensations.   Patient educated throughout session on appropriate technique and form using multi-modal cueing, HEP, and activity modification. Patient articulated understanding and returned demonstration.  Patient Response to interventions: Patient happy to return after  wedding/vacation for follow-up with AFO.  ASSESSMENT Patient presents to clinic with excellent motivation to participate in therapy. Patient demonstrates deficits in muscular endurance, RLE strength, balance, gait, and coordination. Patient performing functional tests with noted improvement absent of AFO during today's session and responded positively to active interventions. Patient will benefit from continued skilled therapeutic intervention to address remaining deficits in muscular endurance, RLE strength, balance, gait, and coordination in order to increase function and improve overall QOL.     PT Long Term Goals - 10/23/19 7591      PT LONG TERM GOAL #1   Title Patient will demonstrate independence with HEP in order to maximize therapeutic gains and improve carryover from physical therapy sessions to ADLs in the home and community.    Baseline IE: provided; 9/21: IND    Time 8    Period Weeks    Status Achieved    Target Date 10/29/19      PT LONG TERM GOAL #2   Title Patient will demonstrate improved function as evidenced by a score of 78 on FOTO measure for full participation in activities at home and in the community.    Baseline IE: 60; 9/13: 69    Time 8    Period Weeks    Status On-going    Target Date 10/29/19      PT LONG TERM GOAL #3   Title Patient will increase by at least 38m (152ft)  in order to demonstrate clinically significant improvement in cardiopulmonary endurance and community ambulation.    Baseline IE: 359 m; 9/21: 385.9 m    Time 8    Period Weeks    Status On-going    Target Date 10/29/19      PT LONG TERM GOAL #4   Title Patient will increase RLE strength by 1/2 grade on MMT to demonstrate clinically significant improvement in strength for improved participation in community activities.    Baseline IE: 3-/5 gross; 9/21: 3+/5    Time 8    Period Weeks    Status On-going    Target Date 10/29/19                 Plan - 10/23/19 0907     Clinical Impression Statement Patient presents to clinic with excellent motivation to participate in therapy. Patient demonstrates deficits in muscular endurance, RLE strength, balance, gait, and coordination. Patient performing functional tests with noted improvement absent of AFO during today's session and responded positively to active interventions. Patient will benefit from continued skilled therapeutic intervention to address remaining deficits in muscular endurance, RLE strength, balance, gait, and coordination in order to increase function and improve overall QOL.    Personal Factors and Comorbidities Age;Comorbidity 3+;Fitness;Past/Current Experience;Time since onset of injury/illness/exacerbation    Comorbidities IBS-C, anemia, asthma    Examination-Activity Limitations Transfers;Squat;Lift;Locomotion Level;Stairs    Examination-Participation Restrictions Cleaning;Yard Work;Laundry;Community Activity;Shop    Stability/Clinical Decision Making Evolving/Moderate complexity    Rehab Potential Fair    PT Frequency 1x / week    PT Duration 8 weeks    PT Treatment/Interventions ADLs/Self Care Home Management;Aquatic Therapy;Moist Heat;Cryotherapy;Electrical Stimulation;Therapeutic activities;Functional mobility training;Stair training;Gait training;Therapeutic exercise;Balance training;Neuromuscular re-education;Scar mobilization;Taping;Orthotic Fit/Training;Patient/family education;Manual techniques;Passive range of motion;Spinal Manipulations;Joint Manipulations;DME Instruction    PT Next Visit Plan balance, strengthening    PT Home Exercise Plan eccentric DF, hip abduction    Consulted and Agree with Plan of Care Patient           Patient will benefit from skilled therapeutic intervention in order to improve the following deficits and impairments:  Abnormal gait, Decreased balance, Decreased endurance, Decreased mobility, Difficulty walking, Improper body mechanics, Impaired flexibility,  Decreased strength, Decreased coordination, Decreased activity tolerance  Visit Diagnosis: Muscle weakness (generalized)  Difficulty in walking, not elsewhere classified  Other lack of coordination     Problem List Patient Active Problem List   Diagnosis Date Noted  . Multiple sclerosis (HCC) 07/03/2017  . Right upper quadrant abdominal pain 09/28/2016  . Irritable bowel syndrome with constipation 09/28/2016  . Lightheadedness 03/03/2016  . Numbness and tingling 09/03/2015  . Chronic tension-type headache, not intractable 09/03/2015  . Sciatica of right side 03/17/2015  . Iron deficiency anemia 03/17/2015  . History of loop electrical excision procedure (LEEP) 09/28/2013   Sheria Lang PT, DPT [phone]  10/23/2019, 1:34 PM  [facility] [address] [place], [place], [address] Phone: [phone]   Fax:  [phone]  Name: Carly Martin MRN: [id number] Date of Birth: 11/18/1985

## 2019-10-25 ENCOUNTER — Telehealth: Payer: Self-pay | Admitting: Internal Medicine

## 2019-10-25 NOTE — Telephone Encounter (Signed)
Patient is calling to request an appoint for possible UTI symptoms. Patient states she gets UTI often. Patient says her Urine is cloudy. Soonest appt offered was 11/01/19. Patient is requesting an antibiotic. Patient reports that she is also getting married next Saturday 11/03/19. And antibiotics tend to make her nauseous. And she does not want to chance it. Please advise CB- [phone] Preferred Pharmacy- [facility] in [place].

## 2019-10-25 NOTE — Telephone Encounter (Signed)
Noted  KP 

## 2019-10-29 ENCOUNTER — Other Ambulatory Visit: Payer: Self-pay

## 2019-10-29 ENCOUNTER — Encounter: Payer: Self-pay | Admitting: Internal Medicine

## 2019-10-29 ENCOUNTER — Ambulatory Visit: Payer: POS | Admitting: Internal Medicine

## 2019-10-29 VITALS — BP 102/68 | HR 83 | Temp 97.7°F | Ht 65.0 in | Wt 215.0 lb

## 2019-10-29 DIAGNOSIS — N3000 Acute cystitis without hematuria: Secondary | ICD-10-CM | POA: Diagnosis not present

## 2019-10-29 DIAGNOSIS — R822 Biliuria: Secondary | ICD-10-CM

## 2019-10-29 LAB — POCT URINALYSIS DIPSTICK
Blood, UA: NEGATIVE
Glucose, UA: NEGATIVE
Leukocytes, UA: NEGATIVE
Nitrite, UA: NEGATIVE
Protein, UA: NEGATIVE
Spec Grav, UA: 1.015 (ref 1.010–1.025)
Urobilinogen, UA: 0.2 E.U./dL
pH, UA: 5 (ref 5.0–8.0)

## 2019-10-29 NOTE — Progress Notes (Signed)
Date:  10/29/2019   Name:  Carly Martin   DOB:  11/21/1985   MRN:  [id number]   Chief Complaint: Urinary Tract Infection (X5 days, started taking medication that she was prescribed last visit, clearing up, nauseous, no other problems)  Urinary Tract Infection  This is a new problem. The current episode started in the past 7 days. The problem has been resolved (after taking old macrobid Rx). The patient is experiencing no pain. There has been no fever. Associated symptoms include nausea. Pertinent negatives include no chills, hematuria or vomiting. She has tried antibiotics for the symptoms. The treatment provided significant relief.  Nausea - for the past few weeks nausea without vomiting or diarrhea.  Abdomen and stomach generally feel full and uncomfortable. Some belching.  No pain or fever/chills.  She is getting married next week and is under some stress.   Lab Results  Component Value Date   CREATININE 0.69 06/04/2019   BUN 11 06/04/2019   NA 135 06/04/2019   K 3.7 06/04/2019   CL 105 06/04/2019   CO2 24 06/04/2019   Lab Results  Component Value Date   CHOL 165 03/18/2015   HDL 36 (L) 03/18/2015   LDLCALC 120 (H) 03/18/2015   TRIG 46 03/18/2015   CHOLHDL 4.6 (H) 03/18/2015   Lab Results  Component Value Date   TSH 1.320 03/03/2016   Lab Results  Component Value Date   HGBA1C 5.6 03/18/2015   Lab Results  Component Value Date   WBC 5.6 06/04/2019   HGB 12.4 06/04/2019   HCT 38.7 06/04/2019   MCV 90.2 06/04/2019   PLT 264 06/04/2019   Lab Results  Component Value Date   ALT 13 06/04/2019   AST 14 (L) 06/04/2019   ALKPHOS 45 06/04/2019   BILITOT 0.7 06/04/2019     Review of Systems  Constitutional: Positive for fatigue. Negative for chills and fever.  Respiratory: Negative for chest tightness and shortness of breath.   Cardiovascular: Negative for chest pain and palpitations.  Gastrointestinal: Positive for abdominal distention and nausea. Negative  for anal bleeding, blood in stool, constipation, diarrhea and vomiting.  Genitourinary: Positive for dysuria (resolved). Negative for hematuria.       Urine dark  Neurological: Negative for dizziness and headaches.    Patient Active Problem List   Diagnosis Date Noted  . Multiple sclerosis (HCC) 07/03/2017  . Right upper quadrant abdominal pain 09/28/2016  . Irritable bowel syndrome with constipation 09/28/2016  . Lightheadedness 03/03/2016  . Numbness and tingling 09/03/2015  . Chronic tension-type headache, not intractable 09/03/2015  . Sciatica of right side 03/17/2015  . Iron deficiency anemia 03/17/2015  . History of loop electrical excision procedure (LEEP) 09/28/2013    Allergies  Allergen Reactions  . Codeine Shortness Of Breath    Past Surgical History:  Procedure Laterality Date  . CERVICAL BIOPSY  W/ LOOP ELECTRODE EXCISION  2008  . TONSILLECTOMY      Social History   Tobacco Use  . Smoking status: Current Every Day Smoker    Packs/day: 0.25    Years: 10.00    Pack years: 2.50    Types: Cigarettes  . Smokeless tobacco: Never Used  Vaping Use  . Vaping Use: Never used  Substance Use Topics  . Alcohol use: Yes    Alcohol/week: 0.0 standard drinks    Comment: socially  . Drug use: No     Medication list has been reviewed and updated.  Current Meds  Medication Sig  . albuterol (VENTOLIN HFA) 108 (90 Base) MCG/ACT inhaler Inhale 2 puffs into the lungs every 6 (six) hours as needed for wheezing or shortness of breath.  . Ascorbic Acid (VITAMIN C) 1000 MG tablet Take 1,000 mg by mouth daily.  Marland Kitchen buPROPion (WELLBUTRIN) 100 MG tablet Take 150 mg by mouth daily. Dr. Odelia Gage Neuro  . Cholecalciferol (VITAMIN D3 SUPER STRENGTH) 50 MCG (2000 UT) TABS Take 2 tablets by mouth daily.   Marland Kitchen etonogestrel (IMPLANON) 68 MG IMPL implant 1 each by Subdermal route once.  . fluticasone (FLONASE) 50 MCG/ACT nasal spray Place 2 sprays into both nostrils daily. (Patient  taking differently: Place 2 sprays into both nostrils as needed. )  . ibuprofen (ADVIL,MOTRIN) 800 MG tablet Take 1 tablet (800 mg total) by mouth every 8 (eight) hours as needed.  . modafinil (PROVIGIL) 100 MG tablet Take 100 mg by mouth daily.   . Multiple Minerals (CALCIUM/MAGNESIUM/ZINC PO) Take 1 tablet by mouth daily.  . Multiple Vitamin (MULTI-VITAMIN) tablet Take 1 tablet by mouth daily.   . riTUXimab in sodium chloride 0.9 % 250 mL Inject 1,000 mg into the vein every 6 (six) months.  Marland Kitchen URINARY HEALTH/CRANBERRY PO Take 1 tablet by mouth daily. With out cranberry    PHQ 2/9 Scores 08/21/2019 08/03/2019 07/11/2019 07/26/2018  PHQ - 2 Score 0 0 0 0  PHQ- 9 Score 0 0 0 0    GAD 7 : Generalized Anxiety Score 08/21/2019 08/03/2019 07/11/2019  Nervous, Anxious, on Edge 0 0 0  Control/stop worrying 0 0 0  Worry too much - different things 0 0 0  Trouble relaxing 0 0 0  Restless 0 0 0  Easily annoyed or irritable 0 0 0  Afraid - awful might happen 0 0 0  Total GAD 7 Score 0 0 0  Anxiety Difficulty Not difficult at all Not difficult at all Not difficult at all    BP Readings from Last 3 Encounters:  10/29/19 102/68  08/21/19 130/82  08/03/19 108/70    Physical Exam Vitals and nursing note reviewed.  Constitutional:      General: She is not in acute distress.    Appearance: She is well-developed.  HENT:     Head: Normocephalic and atraumatic.  Cardiovascular:     Rate and Rhythm: Normal rate and regular rhythm.     Pulses: Normal pulses.  Pulmonary:     Effort: Pulmonary effort is normal. No respiratory distress.     Breath sounds: No wheezing or rhonchi.  Abdominal:     Palpations: Abdomen is soft.     Tenderness: There is abdominal tenderness in the epigastric area. There is no right CVA tenderness, left CVA tenderness, guarding or rebound.  Musculoskeletal:        General: Normal range of motion.     Cervical back: Normal range of motion.  Lymphadenopathy:     Cervical: No  cervical adenopathy.  Skin:    General: Skin is warm and dry.     Coloration: Skin is not jaundiced.     Findings: No rash.  Neurological:     Mental Status: She is alert and oriented to person, place, and time.  Psychiatric:        Behavior: Behavior normal.        Thought Content: Thought content normal.     Wt Readings from Last 3 Encounters:  10/29/19 215 lb (97.5 kg)  08/21/19 220 lb (99.8 kg)  08/03/19 221 lb (  100.2 kg)    BP 102/68 (BP Location: Right Arm, Patient Position: Sitting)   Pulse 83   Temp 97.7 F (36.5 C) (Oral)   Ht 5\' 5"  (1.651 m)   Wt 215 lb (97.5 kg)   LMP 10/05/2019   SpO2 100%   BMI 35.78 kg/m   Assessment and Plan: 1. Acute cystitis without hematuria UA negative except for bilirubin 12/05/2019 Rx - has one more day Continue sufficient fluids  2. Bilirubinuria Seen on UA Abdominal sx concerning for possible gall bladder disease - if labs abnormal will follow up with [facility] - Comprehensive metabolic panel   Partially dictated using Dragon software. Any errors are unintentional.  [place], MD [facility]  10/29/2019

## 2019-10-30 LAB — COMPREHENSIVE METABOLIC PANEL
ALT: 10 IU/L (ref 0–32)
AST: 11 IU/L (ref 0–40)
Albumin/Globulin Ratio: 1.8 (ref 1.2–2.2)
Albumin: 4.4 g/dL (ref 3.8–4.8)
Alkaline Phosphatase: 58 IU/L (ref 44–121)
BUN/Creatinine Ratio: 15 (ref 9–23)
BUN: 11 mg/dL (ref 6–20)
Bilirubin Total: 1 mg/dL (ref 0.0–1.2)
CO2: 22 mmol/L (ref 20–29)
Calcium: 9.5 mg/dL (ref 8.7–10.2)
Chloride: 106 mmol/L (ref 96–106)
Creatinine, Ser: 0.72 mg/dL (ref 0.57–1.00)
GFR calc Af Amer: 125 mL/min/{1.73_m2} (ref >59)
GFR calc non Af Amer: 109 mL/min/{1.73_m2} (ref >59)
Globulin, Total: 2.4 g/dL (ref 1.5–4.5)
Glucose: 68 mg/dL (ref 65–99)
Potassium: 4.3 mmol/L (ref 3.5–5.2)
Sodium: 139 mmol/L (ref 134–144)
Total Protein: 6.8 g/dL (ref 6.0–8.5)

## 2019-11-13 ENCOUNTER — Other Ambulatory Visit: Payer: Self-pay

## 2019-11-13 ENCOUNTER — Encounter: Payer: Self-pay | Admitting: Physical Therapy

## 2019-11-13 ENCOUNTER — Ambulatory Visit: Payer: POS | Attending: Physician Assistant | Admitting: Physical Therapy

## 2019-11-13 DIAGNOSIS — R278 Other lack of coordination: Secondary | ICD-10-CM | POA: Diagnosis present

## 2019-11-13 DIAGNOSIS — M6281 Muscle weakness (generalized): Secondary | ICD-10-CM | POA: Diagnosis present

## 2019-11-13 DIAGNOSIS — R262 Difficulty in walking, not elsewhere classified: Secondary | ICD-10-CM

## 2019-11-13 NOTE — Therapy (Signed)
[facility] [address] [place], [place], [address] Phone: [phone]   Fax:  [phone]  Physical Therapy Treatment  Patient Details  Name: Carly Martin MRN: [id number] Date of Birth: [birth year] Referring Provider (PT): Corine [facility]ter   Encounter Date: 11/13/2019   PT End of Session - 11/13/19 0920    Visit Number 7    Number of Visits 8    Date for PT Re-Evaluation 11/13/19    Authorization Type IE: 09/03/2019    PT Start Time 0900    PT Stop Time 0955    PT Time Calculation (min) 55 min    Activity Tolerance Patient tolerated treatment well    Behavior During Therapy [facility] for tasks assessed/performed           Past Medical History:  Diagnosis Date  . Asthma     Past Surgical History:  Procedure Laterality Date  . CERVICAL BIOPSY  W/ LOOP ELECTRODE EXCISION  2008  . TONSILLECTOMY      There were no vitals filed for this visit.   Subjective Assessment - 11/13/19 0907    Subjective Patient states that she had a nice wedding and has been wearing her AFO more regularly. She does note some increased discomfort from adjusting to the AFO. Patient reports noting increased energy and improved foot clearance with AFO.    Pertinent History MS diagnosed 07/04/2017; 2017 onset of migraines; about 11 years ago patient noted onset of numbness and other symptoms.    How long can you walk comfortably? 0.5 mile    Patient Stated Goals improve walking; return to riding bike           TREATMENT  Neuromuscular Re-education: Reassessed goals: see below.  Review of HEP and regular fitness practices for improved maintenance of progress.   Patient educated throughout session on appropriate technique and form using multi-modal cueing, HEP, and activity modification. Patient articulated understanding and returned demonstration.  Patient Response to interventions: Patient comfortable with d/c to  self-management.  ASSESSMENT Patient presents to clinic with excellent motivation to participate in therapy. Patient demonstrates minimal deficits in muscular endurance, RLE strength, balance, gait, and coordination while wearing AFO on RLE. Patient had achieved or partially met all goals set forth for therapy and at this time is appropriate to discharge to self-management of muscular endurance, RLE strength, balance, gait, and coordination in order to maintain/improve increased function and improve overall QOL. Due to the progressive nature of multiple sclerosis, patient will likely benefit from physical therapy in the future.     PT Long Term Goals - 11/13/19 7408      PT LONG TERM GOAL #1   Title Patient will demonstrate independence with HEP in order to maximize therapeutic gains and improve carryover from physical therapy sessions to ADLs in the home and community.    Baseline IE: provided; 9/21: IND    Time 8    Period Weeks    Status Achieved      PT LONG TERM GOAL #2   Title Patient will demonstrate improved function as evidenced by a score of 78 on FOTO measure for full participation in activities at home and in the community.    Baseline IE: 60; 9/13: 69; 10/12: 97    Time 8    Period Weeks    Status Achieved      PT LONG TERM GOAL #3   Title Patient will increase 6MWT by at least 21m(1646f in order  to demonstrate clinically significant improvement in cardiopulmonary endurance and community ambulation.    Baseline IE: 359 m; 9/21: 385.9 m; 10/12: 427.9 m    Time 8    Period Weeks    Status Achieved      PT LONG TERM GOAL #4   Title Patient will increase RLE strength by 1/2 grade on MMT to demonstrate clinically significant improvement in strength for improved participation in community activities.    Baseline IE: 3-/5 gross; 9/21: 3+/5; 10/12: 3+/5    Time 8    Period Weeks    Status Partially Met                 Plan - 11/13/19 1028    Clinical Impression  Statement Patient presents to clinic with excellent motivation to participate in therapy. Patient demonstrates minimal deficits in muscular endurance, RLE strength, balance, gait, and coordination while wearing AFO on RLE. Patient had achieved or partially met all goals set forth for therapy and at this time is appropriate to discharge to self-management of muscular endurance, RLE strength, balance, gait, and coordination in order to maintain/improve increased function and improve overall QOL. Due to the progressive nature of multiple sclerosis, patient will likely benefit from physical therapy in the future.    Personal Factors and Comorbidities Age;Comorbidity 3+;Fitness;Past/Current Experience;Time since onset of injury/illness/exacerbation    Comorbidities IBS-C, anemia, asthma    Examination-Activity Limitations Transfers;Squat;Lift;Locomotion Level;Stairs    Examination-Participation Restrictions Cleaning;Yard Work;Laundry;Community Activity;Shop    Stability/Clinical Decision Making Evolving/Moderate complexity    Rehab Potential Fair    PT Frequency One time visit    PT Duration --    PT Treatment/Interventions ADLs/Self Care Home Management;Aquatic Therapy;Moist Heat;Cryotherapy;Electrical Stimulation;Therapeutic activities;Functional mobility training;Stair training;Gait training;Therapeutic exercise;Balance training;Neuromuscular re-education;Scar mobilization;Taping;Orthotic Fit/Training;Patient/family education;Manual techniques;Passive range of motion;Spinal Manipulations;Joint Manipulations;DME Instruction    PT Next Visit Plan balance, strengthening    PT Home Exercise Plan eccentric DF, hip abduction    Consulted and Agree with Plan of Care Patient           Patient will benefit from skilled therapeutic intervention in order to improve the following deficits and impairments:  Abnormal gait, Decreased balance, Decreased endurance, Decreased mobility, Difficulty walking, Improper  body mechanics, Impaired flexibility, Decreased strength, Decreased coordination, Decreased activity tolerance  Visit Diagnosis: Muscle weakness (generalized)  Difficulty in walking, not elsewhere classified  Other lack of coordination     Problem List Patient Active Problem List   Diagnosis Date Noted  . Multiple sclerosis ([place]) 07/03/2017  . Right upper quadrant abdominal pain 09/28/2016  . Irritable bowel syndrome with constipation 09/28/2016  . Lightheadedness 03/03/2016  . Numbness and tingling 09/03/2015  . Chronic tension-type headache, not intractable 09/03/2015  . Sciatica of right side 03/17/2015  . Iron deficiency anemia 03/17/2015  . History of loop electrical excision procedure (LEEP) 09/28/2013   Myles Gip PT, DPT [phone]  11/13/2019, 10:32 AM  [facility] [address] [place], [place], [address] Phone: [phone]   Fax:  [phone]  Name: Carly Martin MRN: [id number] Date of Birth: 1985-07-11

## 2019-12-10 NOTE — Progress Notes (Addendum)
..  The following Medication: Rituxan is approved for drug replacement program by [place]. The enrollment period is from 12/10/2019 to indefinitely.  Reason for Assistance: EOOP/Off label use. ID: [id number] First DOS:02/04/2020.

## 2020-01-14 LAB — HM PAP SMEAR: HM Pap smear: NORMAL

## 2020-01-14 LAB — RESULTS CONSOLE HPV: CHL HPV: NEGATIVE

## 2020-01-16 ENCOUNTER — Ambulatory Visit (INDEPENDENT_AMBULATORY_CARE_PROVIDER_SITE_OTHER): Payer: POS | Admitting: Gastroenterology

## 2020-01-16 ENCOUNTER — Encounter: Payer: Self-pay | Admitting: Gastroenterology

## 2020-01-16 ENCOUNTER — Ambulatory Visit (INDEPENDENT_AMBULATORY_CARE_PROVIDER_SITE_OTHER): Payer: POS

## 2020-01-16 ENCOUNTER — Other Ambulatory Visit: Payer: Self-pay

## 2020-01-16 VITALS — BP 118/77 | HR 92 | Ht 65.0 in | Wt 216.8 lb

## 2020-01-16 DIAGNOSIS — K581 Irritable bowel syndrome with constipation: Secondary | ICD-10-CM | POA: Diagnosis not present

## 2020-01-16 DIAGNOSIS — Z23 Encounter for immunization: Secondary | ICD-10-CM | POA: Diagnosis not present

## 2020-01-16 MED ORDER — HYOSCYAMINE SULFATE 0.125 MG SL SUBL
0.1250 mg | SUBLINGUAL_TABLET | SUBLINGUAL | 3 refills | Status: DC | PRN
Start: 2020-01-16 — End: 2021-09-08

## 2020-01-16 NOTE — Progress Notes (Signed)
Gastroenterology Consultation  Referring Provider:     Reubin Milan, MD Primary Care Physician:  Reubin Milan, MD Primary Gastroenterologist:  Dr. Servando Snare     Reason for Consultation:     Left side abdominal pain        HPI:   Carly Martin is a 34 y.o. y/o female referred for consultation & management of left side abdominal pain by Dr. Judithann Graves, Nyoka Cowden, MD.  This patient comes in today after being seen by me back in October 2018.  At that time the patient had right-sided abdominal pain that was in the mid axillary line.  The patient had reported that her symptoms had felt better with bowel movements the patient was started on Linzess at that time.  Back in 2018 she had reported that her symptoms were not made any better or worse with eating or drinking and had denied any family history of any colonic pathology.  The patient reports that she is now having attacks in the left side of her abdomen.  The symptoms are so severe that she is scared that she may not be able to handle this much pain when she gets older.  She states that it is immediately made better after she moves her bowels.  She reports that she sits down on the toilet and has severe left-sided pain and starts to sweat and feel clammy.  After she moves her bowels the symptoms resolved.  She still reports that she moves her bowels once every 3 days but denies them being hard.  She also reports that she does supplement her diet with fiber.  Past Medical History:  Diagnosis Date  . Asthma     Past Surgical History:  Procedure Laterality Date  . CERVICAL BIOPSY  W/ LOOP ELECTRODE EXCISION  2008  . TONSILLECTOMY      Prior to Admission medications   Medication Sig Start Date End Date Taking? Authorizing Provider  albuterol (VENTOLIN HFA) 108 (90 Base) MCG/ACT inhaler Inhale 2 puffs into the lungs every 6 (six) hours as needed for wheezing or shortness of breath. 07/11/19   Reubin Milan, MD  Ascorbic Acid (VITAMIN  C) 1000 MG tablet Take 1,000 mg by mouth daily.    [provider]  buPROPion (WELLBUTRIN) 100 MG tablet Take 150 mg by mouth daily. Dr. Odelia Gage Neuro    [provider]  Cholecalciferol (VITAMIN D3 SUPER STRENGTH) 50 MCG (2000 UT) TABS Take 2 tablets by mouth daily.     [provider]  etonogestrel (IMPLANON) 68 MG IMPL implant 1 each by Subdermal route once.    [provider]  fluticasone (FLONASE) 50 MCG/ACT nasal spray Place 2 sprays into both nostrils daily. Patient taking differently: Place 2 sprays into both nostrils as needed.  10/26/16   Domenick Gong, MD  ibuprofen (ADVIL,MOTRIN) 800 MG tablet Take 1 tablet (800 mg total) by mouth every 8 (eight) hours as needed. 04/16/18   Reubin Milan, MD  modafinil (PROVIGIL) 100 MG tablet Take 100 mg by mouth daily.  08/20/18   [provider]  Multiple Minerals (CALCIUM/MAGNESIUM/ZINC PO) Take 1 tablet by mouth daily.    [provider]  Multiple Vitamin (MULTI-VITAMIN) tablet Take 1 tablet by mouth daily.     [provider]  riTUXimab in sodium chloride 0.9 % 250 mL Inject 1,000 mg into the vein every 6 (six) months.    [provider]  URINARY HEALTH/CRANBERRY PO Take 1  tablet by mouth daily. With out cranberry    [provider]    Family History  Problem Relation Age of Onset  . Migraines Sister   . Cancer Maternal Grandfather   . Cancer Paternal Grandfather      Social History   Tobacco Use  . Smoking status: Current Every Day Smoker    Packs/day: 0.25    Years: 10.00    Pack years: 2.50    Types: Cigarettes  . Smokeless tobacco: Never Used  Vaping Use  . Vaping Use: Never used  Substance Use Topics  . Alcohol use: Yes    Alcohol/week: 0.0 standard drinks    Comment: socially  . Drug use: No    Allergies as of 01/16/2020 - Review Complete 11/13/2019  Allergen Reaction Noted  . Codeine Shortness Of Breath 03/17/2015    Review of  Systems:    All systems reviewed and negative except where noted in HPI.   Physical Exam:  There were no vitals taken for this visit. No LMP recorded. General:   Alert,  Well-developed, well-nourished, pleasant and cooperative in NAD Head:  Normocephalic and atraumatic. Eyes:  Sclera clear, no icterus.   Conjunctiva pink. Ears:  Normal auditory acuity. Neck:  Supple; no masses or thyromegaly. Lungs:  Respirations even and unlabored.  Clear throughout to auscultation.   No wheezes, crackles, or rhonchi. No acute distress. Heart:  Regular rate and rhythm; no murmurs, clicks, rubs, or gallops. Abdomen:  Normal bowel sounds.  No bruits.  Soft, non-tender and non-distended without masses, hepatosplenomegaly or hernias noted.  No guarding or rebound tenderness.  Negative Carnett sign.   Rectal:  Deferred.  Pulses:  Normal pulses noted. Extremities:  No clubbing or edema.  No cyanosis. Neurologic:  Alert and oriented x3;  grossly normal neurologically. Skin:  Intact without significant lesions or rashes.  No jaundice. Lymph Nodes:  No significant cervical adenopathy. Psych:  Alert and cooperative. Normal mood and affect.  Imaging Studies: No results found.  Assessment and Plan:   Carly Martin is a 33 y.o. y/o female who comes in today with a history of abdominal pain is severe and in the left side of her abdomen.  It is quickly relieved by moving her bowels but is intense when it happens. The patient will be given a prescription for Levsin to be taken sublingually when she has these attacks.  She has also been told to increase fiber in her diet and possibly start Citrucel twice a day.  She has been told that she shouldn't have a bowel movement every day if not every other day but to try and avoid having diarrhea.  If her stools are not moving more frequently on the fiber she has been told to supplement it with some MiraLAX.  The patient has been explained the plan and agrees with  it.    Midge Minium, MD. Clementeen Graham    Note: This dictation was prepared with Dragon dictation along with smaller phrase technology. Any transcriptional errors that result from this process are unintentional.

## 2020-02-04 ENCOUNTER — Ambulatory Visit: Payer: POS | Admitting: Hematology and Oncology

## 2020-02-04 ENCOUNTER — Other Ambulatory Visit: Payer: Self-pay

## 2020-02-04 ENCOUNTER — Ambulatory Visit: Payer: POS

## 2020-02-04 ENCOUNTER — Ambulatory Visit: Payer: POS | Admitting: Internal Medicine

## 2020-02-04 ENCOUNTER — Encounter: Payer: Self-pay | Admitting: Internal Medicine

## 2020-02-04 ENCOUNTER — Other Ambulatory Visit: Payer: POS

## 2020-02-04 VITALS — BP 118/70 | HR 89 | Temp 98.5°F | Ht 65.0 in | Wt 218.0 lb

## 2020-02-04 DIAGNOSIS — F17200 Nicotine dependence, unspecified, uncomplicated: Secondary | ICD-10-CM | POA: Diagnosis not present

## 2020-02-04 DIAGNOSIS — H6690 Otitis media, unspecified, unspecified ear: Secondary | ICD-10-CM

## 2020-02-04 DIAGNOSIS — G35 Multiple sclerosis: Secondary | ICD-10-CM | POA: Diagnosis not present

## 2020-02-04 MED ORDER — AZITHROMYCIN 250 MG PO TABS: ORAL_TABLET | ORAL | 0 refills | Status: AC

## 2020-02-04 NOTE — Progress Notes (Signed)
Date:  02/04/2020   Name:  Carly Martin   DOB:  Feb 04, 1985   MRN:  [id number]   Chief Complaint: Ear Pain (Left ear pain X 3 days. Has post nasal drip, and throat irritation from nasal drip. )  Otalgia  There is pain in the left ear. This is a new problem. The current episode started in the past 7 days. The problem occurs constantly. The problem has been gradually worsening. There has been no fever. The pain is moderate. Associated symptoms include rhinorrhea and a sore throat. Pertinent negatives include no coughing, ear discharge or headaches.    Lab Results  Component Value Date   CREATININE 0.72 10/29/2019   BUN 11 10/29/2019   NA 139 10/29/2019   K 4.3 10/29/2019   CL 106 10/29/2019   CO2 22 10/29/2019   Lab Results  Component Value Date   CHOL 165 03/18/2015   HDL 36 (L) 03/18/2015   LDLCALC 120 (H) 03/18/2015   TRIG 46 03/18/2015   CHOLHDL 4.6 (H) 03/18/2015   Lab Results  Component Value Date   TSH 1.320 03/03/2016   Lab Results  Component Value Date   HGBA1C 5.6 03/18/2015   Lab Results  Component Value Date   WBC 5.6 06/04/2019   HGB 12.4 06/04/2019   HCT 38.7 06/04/2019   MCV 90.2 06/04/2019   PLT 264 06/04/2019   Lab Results  Component Value Date   ALT 10 10/29/2019   AST 11 10/29/2019   ALKPHOS 58 10/29/2019   BILITOT 1.0 10/29/2019     Review of Systems  Constitutional: Negative for chills, fatigue and fever.  HENT: Positive for ear pain, rhinorrhea, sinus pressure and sore throat. Negative for ear discharge and trouble swallowing.   Respiratory: Negative for cough, chest tightness, shortness of breath and wheezing.   Cardiovascular: Negative for chest pain and palpitations.  Neurological: Negative for dizziness and headaches.    Patient Active Problem List   Diagnosis Date Noted  . Foot drop, right 08/15/2019  . Multiple sclerosis ([place]) 07/03/2017  . Right upper quadrant abdominal pain 09/28/2016  . Irritable bowel syndrome with  constipation 09/28/2016  . Lightheadedness 03/03/2016  . Numbness and tingling 09/03/2015  . Chronic tension-type headache, not intractable 09/03/2015  . Sciatica of right side 03/17/2015  . Iron deficiency anemia 03/17/2015  . History of loop electrical excision procedure (LEEP) 09/28/2013    Allergies  Allergen Reactions  . Codeine Shortness Of Breath    Past Surgical History:  Procedure Laterality Date  . CERVICAL BIOPSY  W/ LOOP ELECTRODE EXCISION  2008  . TONSILLECTOMY      Social History   Tobacco Use  . Smoking status: Current Every Day Smoker    Packs/day: 0.25    Years: 10.00    Pack years: 2.50    Types: Cigarettes  . Smokeless tobacco: Never Used  Vaping Use  . Vaping Use: Never used  Substance Use Topics  . Alcohol use: Yes    Alcohol/week: 0.0 standard drinks    Comment: socially  . Drug use: No     Medication list has been reviewed and updated.  Current Meds  Medication Sig  . albuterol (VENTOLIN HFA) 108 (90 Base) MCG/ACT inhaler Inhale 2 puffs into the lungs every 6 (six) hours as needed for wheezing or shortness of breath.  . Ascorbic Acid (VITAMIN C) 1000 MG tablet Take 1,000 mg by mouth daily.  Marland Kitchen buPROPion (WELLBUTRIN) 100 MG tablet Take 150 mg  by mouth daily. Dr. Odelia Gage Neuro  . Cholecalciferol (VITAMIN D3 SUPER STRENGTH) 50 MCG (2000 UT) TABS Take 2 tablets by mouth daily.   Marland Kitchen etonogestrel (NEXPLANON) 68 MG IMPL implant 1 each by Subdermal route once.  . fluticasone (FLONASE) 50 MCG/ACT nasal spray Place 2 sprays into both nostrils daily. (Patient taking differently: Place 2 sprays into both nostrils as needed.)  . hyoscyamine (LEVSIN SL) 0.125 MG SL tablet Place 1 tablet (0.125 mg total) under the tongue every 4 (four) hours as needed.  Marland Kitchen ibuprofen (ADVIL,MOTRIN) 800 MG tablet Take 1 tablet (800 mg total) by mouth every 8 (eight) hours as needed.  . modafinil (PROVIGIL) 100 MG tablet Take 100 mg by mouth daily.   . Multiple Minerals  (CALCIUM/MAGNESIUM/ZINC PO) Take 1 tablet by mouth daily.  . Multiple Vitamin (MULTI-VITAMIN) tablet Take 1 tablet by mouth daily.   . riTUXimab in sodium chloride 0.9 % 250 mL Inject 1,000 mg into the vein every 6 (six) months.  Marland Kitchen URINARY HEALTH/CRANBERRY PO Take 1 tablet by mouth daily. With out cranberry    PHQ 2/9 Scores 02/04/2020 08/21/2019 08/03/2019 07/11/2019  PHQ - 2 Score 0 0 0 0  PHQ- 9 Score 0 0 0 0    GAD 7 : Generalized Anxiety Score 02/04/2020 08/21/2019 08/03/2019 07/11/2019  Nervous, Anxious, on Edge 0 0 0 0  Control/stop worrying 0 0 0 0  Worry too much - different things 0 0 0 0  Trouble relaxing 0 0 0 0  Restless 0 0 0 0  Easily annoyed or irritable 0 0 0 0  Afraid - awful might happen 0 0 0 0  Total GAD 7 Score 0 0 0 0  Anxiety Difficulty Not difficult at all Not difficult at all Not difficult at all Not difficult at all    BP Readings from Last 3 Encounters:  02/04/20 118/70  01/16/20 118/77  10/29/19 102/68    Physical Exam Constitutional:      Appearance: She is well-developed and well-nourished.  HENT:     Right Ear: Tympanic membrane, ear canal and external ear normal. Tympanic membrane is not erythematous or retracted.     Left Ear: Ear canal and external ear normal.  No middle ear effusion. Tympanic membrane is retracted (and dull ). Tympanic membrane is not erythematous.     Nose:     Right Sinus: No maxillary sinus tenderness or frontal sinus tenderness.     Left Sinus: No maxillary sinus tenderness or frontal sinus tenderness.     Mouth/Throat:     Mouth: Mucous membranes are normal. No oral lesions.     Pharynx: Uvula midline. Posterior oropharyngeal erythema (and cobblestoning) present. No oropharyngeal exudate.  Cardiovascular:     Rate and Rhythm: Normal rate and regular rhythm.     Heart sounds: Normal heart sounds.  Pulmonary:     Breath sounds: Normal breath sounds. No wheezing or rales.  Lymphadenopathy:     Cervical: No cervical adenopathy.   Neurological:     Mental Status: She is alert and oriented to person, place, and time.  Psychiatric:        Attention and Perception: Attention and perception normal.     Wt Readings from Last 3 Encounters:  02/04/20 218 lb (98.9 kg)  01/16/20 216 lb 12.8 oz (98.3 kg)  10/29/19 215 lb (97.5 kg)    BP 118/70   Pulse 89   Temp 98.5 F (36.9 C) (Oral)   Ht 5\' 5"  (  1.651 m)   Wt 218 lb (98.9 kg)   LMP 01/14/2020 (Exact Date)   SpO2 99%   BMI 36.28 kg/m   Assessment and Plan: 1. Acute otitis media, unspecified otitis media type Continue Tylenol/advil/heat Take Xyzal every day for allergy symptoms - azithromycin (ZITHROMAX Z-PAK) 250 MG tablet; UAD  Dispense: 6 each; Refill: 0  2. Tobacco dependence Did not benefit from Bupropion Might try Commit lozenges PRN since she is a light smoker  3. Multiple sclerosis ([facility]) Stable symptoms On Rituximab   Partially dictated using Animal nutritionist. Any errors are unintentional.  Bari Edward, MD [facility]  02/04/2020

## 2020-02-07 NOTE — Progress Notes (Signed)
[facility]  [address], [address] [place], [place] [address] Phone: [phone]  Fax: [phone]   Clinic Day:  02/11/2020  Referring physician: Reubin Milan, MD  Chief Complaint: Carly Martin is a 35 y.o. female with multiple sclerosis who is seen for 8 month assessment prior to cycle #6 Rituxan.   HPI: The patient was last seen in the medical oncology clinic on 06/04/2019. At that time, she felt "good".  She noted intermittent symptoms.  Exam was stable. Hematocrit was 38.7, hemoglobin 12.4, platelets 264,000, WBC 5,600. Calcium was 8.7. AST was 14. She received cycle #5 Rituxan.   The patient saw Dr. Malvin Johns on 08/14/2019 via telemedicine. She continued to have right side weakness and right foot drop. Follow up was planned for 02/2020.  Head MRI with and without contrast on 07/26/2019 revealed multiple white matter lesions in a distribution c/w multiple sclerosis, unchanged.  Cervical and thoracic spine MRI with and without contrast on 07/26/2019 revealed a small hyperintensity overlying the right hemicord at the C5-6 level which could be artifactual. Attention on follow-up was recommended. There was no abnormal enhancement. There were no thoracic cord lesions.  During the interim, she has been "good". Her last treatment went "pretty good." Her right sided weakness and foot drop are stable. She wears a brace on her right foot. Every once in a while, she has trouble with tasks such as opening the fridge or washing dishes.  The patient still has pressure behind her eyes but does not have glaucoma. She has not had any "MS hugs" since her last visit. She has headaches and right sided weakness everyday. The rest of her symptoms come and go including exercise induced asthma and tingling in her feet. She gets frequent UTIs.  The patient has lost 9 lbs unintentionally since her last visit.    Past Medical History:  Diagnosis Date  . Asthma     Past  Surgical History:  Procedure Laterality Date  . CERVICAL BIOPSY  W/ LOOP ELECTRODE EXCISION  2008  . TONSILLECTOMY      Family History  Problem Relation Age of Onset  . Migraines Sister   . Cancer Maternal Grandfather   . Cancer Paternal Grandfather     Social History:  reports that she has been smoking cigarettes. She has a 2.50 pack-year smoking history. She has never used smokeless tobacco. She reports current alcohol use. She reports that she does not use drugs. She smokes 5-6 cigarettes/day.  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  She lives in [place]. The patient is alone today.   Allergies:  Allergies  Allergen Reactions  . Codeine Shortness Of Breath    Current Medications: Current Outpatient Medications  Medication Sig Dispense Refill  . albuterol (VENTOLIN HFA) 108 (90 Base) MCG/ACT inhaler Inhale 2 puffs into the lungs every 6 (six) hours as needed for wheezing or shortness of breath. 18 g 1  . Ascorbic Acid (VITAMIN C) 1000 MG tablet Take 1,000 mg by mouth daily.    . Cholecalciferol (VITAMIN D3 SUPER STRENGTH) 50 MCG (2000 UT) TABS Take 2 tablets by mouth daily.     Marland Kitchen etonogestrel (NEXPLANON) 68 MG IMPL implant 1 each by Subdermal route once.    . fluticasone (FLONASE) 50 MCG/ACT nasal spray Place 2 sprays into both nostrils daily. (Patient taking differently: Place 2 sprays into both nostrils as needed.) 16 g 0  .  hyoscyamine (LEVSIN SL) 0.125 MG SL tablet Place 1 tablet (0.125 mg total) under the tongue every 4 (four) hours as needed. 30 tablet 3  . ibuprofen (ADVIL,MOTRIN) 800 MG tablet Take 1 tablet (800 mg total) by mouth every 8 (eight) hours as needed. 90 tablet 3  . modafinil (PROVIGIL) 100 MG tablet Take 100 mg by mouth daily.     . Multiple Minerals (CALCIUM/MAGNESIUM/ZINC PO) Take 1 tablet by mouth daily.    . Multiple Vitamin (MULTI-VITAMIN) tablet Take 1 tablet by mouth daily.      . riTUXimab in sodium chloride 0.9 % 250 mL Inject 1,000 mg into the vein every 6 (six) months.    Marland Kitchen URINARY HEALTH/CRANBERRY PO Take 1 tablet by mouth daily. With out cranberry     No current facility-administered medications for this visit.   Facility-Administered Medications Ordered in Other Visits  Medication Dose Route Frequency Provider Last Rate Last Admin  . 0.9 %  sodium chloride infusion   Intravenous Once Nolon Stalls C, MD      . acetaminophen (TYLENOL) tablet 1,000 mg  1,000 mg Oral Once Lequita Asal, MD      . diphenhydrAMINE (BENADRYL) capsule 50 mg  50 mg Oral Once Nolon Stalls C, MD      . methylPREDNISolone sodium succinate (SOLU-MEDROL) 100 mg in sodium chloride 0.9 % 50 mL IVPB  100 mg Intravenous Once Maicol Bowland C, MD      . riTUXimab (RITUXAN) 1,000 mg in sodium chloride 0.9 % 250 mL (2.8571 mg/mL) infusion  1,000 mg Intravenous Once Lequita Asal, MD        Review of Systems  Constitutional: Positive for weight loss (down 9 lbs). Negative for chills, diaphoresis, fever and malaise/fatigue.  HENT: Negative.  Negative for congestion, ear discharge, ear pain, hearing loss, nosebleeds, sinus pain, sore throat and tinnitus.   Eyes: Positive for pain (both eyes, feels like pressure). Negative for blurred vision, double vision and photophobia.  Respiratory: Negative for cough, hemoptysis, sputum production and shortness of breath.        Exercise induced asthma.  Cardiovascular: Negative.  Negative for chest pain, palpitations, orthopnea and leg swelling.  Gastrointestinal: Negative.  Negative for abdominal pain, blood in stool, constipation, diarrhea, heartburn, melena, nausea and vomiting.  Genitourinary: Negative.  Negative for dysuria, frequency, hematuria and urgency.  Musculoskeletal: Negative.  Negative for back pain, falls, joint pain, myalgias and neck pain.  Skin: Negative.  Negative for itching and rash.  Neurological: Positive for  tingling (in feet, comes and goes), focal weakness (strength in legs comes and goes), weakness (right side) and headaches (daily). Negative for dizziness, tremors, sensory change, speech change and seizures.       Multiple Sclerosis. Patient notes "delayed responses". Coordination issues. No "MS hugs" since last visit.  Endo/Heme/Allergies: Negative.   Psychiatric/Behavioral: Negative.  Negative for depression and memory loss. The patient is not nervous/anxious and does not have insomnia.   All other systems reviewed and are negative.  Performance status (ECOG): 1   Vitals Blood pressure 115/66, pulse 92, temperature (!) 96.3 F (35.7 C), temperature source Tympanic, resp. rate 16, weight 215 lb 8 oz (97.7 kg), last menstrual period 01/14/2020, SpO2 100 %.  Physical Exam Vitals and nursing note reviewed.  Constitutional:      General: She is not in acute distress.    Appearance: She is well-developed. She is not diaphoretic.  HENT:     Head: Normocephalic and atraumatic.  Mouth/Throat:     Pharynx: No oropharyngeal exudate.  Eyes:     General: No scleral icterus.    Conjunctiva/sclera: Conjunctivae normal.     Pupils: Pupils are equal, round, and reactive to light.     Comments: Brown eyes.  Neck:     Vascular: No JVD.  Cardiovascular:     Rate and Rhythm: Normal rate and regular rhythm.     Heart sounds: Normal heart sounds. No murmur heard.   Pulmonary:     Effort: Pulmonary effort is normal. No respiratory distress.     Breath sounds: Normal breath sounds. No wheezing or rales.  Chest:     Chest wall: No tenderness.  Breasts:     Right: No supraclavicular adenopathy.     Left: No supraclavicular adenopathy.    Abdominal:     General: Bowel sounds are normal.     Palpations: Abdomen is soft. There is no mass.     Tenderness: There is no abdominal tenderness. There is no guarding or rebound.  Musculoskeletal:        General: No tenderness. Normal range of motion.      Cervical back: Normal range of motion and neck supple.  Lymphadenopathy:     Cervical: No cervical adenopathy.     Upper Body:     Right upper body: No supraclavicular adenopathy.     Left upper body: No supraclavicular adenopathy.  Skin:    General: Skin is warm and dry.  Neurological:     Mental Status: She is alert and oriented to person, place, and time.     Comments: Cranial nerves II-XII are normal. Upper extremity strength is normal and symmetric. Right proximal leg weaker than left. Sensation is intact in upper and lower extremities. Patellar reflexes are normal.  Psychiatric:        Behavior: Behavior normal.        Thought Content: Thought content normal.        Judgment: Judgment normal.    Appointment on 02/11/2020  Component Date Value Ref Range Status  . WBC 02/11/2020 7.0  4.0 - 10.5 K/uL Final  . RBC 02/11/2020 4.36  3.87 - 5.11 MIL/uL Final  . Hemoglobin 02/11/2020 12.5  12.0 - 15.0 g/dL Final  . HCT 24/58/0998 38.6  36.0 - 46.0 % Final  . MCV 02/11/2020 88.5  80.0 - 100.0 fL Final  . MCH 02/11/2020 28.7  26.0 - 34.0 pg Final  . MCHC 02/11/2020 32.4  30.0 - 36.0 g/dL Final  . RDW 33/82/5053 13.2  11.5 - 15.5 % Final  . Platelets 02/11/2020 306  150 - 400 K/uL Final  . nRBC 02/11/2020 0.0  0.0 - 0.2 % Final  . Neutrophils Relative % 02/11/2020 61  % Final  . Neutro Abs 02/11/2020 4.3  1.7 - 7.7 K/uL Final  . Lymphocytes Relative 02/11/2020 30  % Final  . Lymphs Abs 02/11/2020 2.1  0.7 - 4.0 K/uL Final  . Monocytes Relative 02/11/2020 8  % Final  . Monocytes Absolute 02/11/2020 0.6  0.1 - 1.0 K/uL Final  . Eosinophils Relative 02/11/2020 0  % Final  . Eosinophils Absolute 02/11/2020 0.0  0.0 - 0.5 K/uL Final  . Basophils Relative 02/11/2020 1  % Final  . Basophils Absolute 02/11/2020 0.1  0.0 - 0.1 K/uL Final  . Immature Granulocytes 02/11/2020 0  % Final  . Abs Immature Granulocytes 02/11/2020 0.02  0.00 - 0.07 K/uL Final   Performed at [facility], [address] [place], [place] [address]  . Sodium 02/11/2020 137  135 - 145 mmol/L Final  . Potassium 02/11/2020 3.8  3.5 - 5.1 mmol/L Final  . Chloride 02/11/2020 103  98 - 111 mmol/L Final  . CO2 02/11/2020 19* 22 - 32 mmol/L Final  . Glucose, Bld 02/11/2020 110* 70 - 99 mg/dL Final   Glucose reference range applies only to samples taken after fasting for at least 8 hours.  . BUN 02/11/2020 14  6 - 20 mg/dL Final  . Creatinine, Ser 02/11/2020 0.73  0.44 - 1.00 mg/dL Final  . Calcium 95/28/4132 8.9  8.9 - 10.3 mg/dL Final  . Total Protein 02/11/2020 7.6  6.5 - 8.1 g/dL Final  . Albumin 44/02/270 4.1  3.5 - 5.0 g/dL Final  . AST 53/66/4403 13* 15 - 41 U/L Final  . ALT 02/11/2020 12  0 - 44 U/L Final  . Alkaline Phosphatase 02/11/2020 42  38 - 126 U/L Final  . Total Bilirubin 02/11/2020 0.9  0.3 - 1.2 mg/dL Final  . GFR, Estimated 02/11/2020 >60  >60 mL/min Final   Comment: (NOTE) Calculated using the CKD-EPI Creatinine Equation (2021)   . Anion gap 02/11/2020 15  5 - 15 Final   Performed at [facility], [address] [place], [place] [address]    Assessment:  Carly Martin is a 35 y.o. female with multiple sclerosis.  She was diagnosed on 07/04/2017.    Patient was admitted to [facility] from 07/03/2017 - 07/05/2017 with left leg tingling and numbness.  She received high-dose steroids x 3 days with resolution of symptoms.    LP on 07/04/2017 revealed eight oligoclonal bands were observed in the CSF.  MRI cervical and thoracic spine on 07/03/2017 revealed no evidence of a demyelinating disease.  Head MRI on 07/03/2017 revealed scattered foci of abnormal T2 and FLAIR signal in the frontal deep and subcortical white matter and in the parietal cortical and subcortical brain.  She has + MOG testing. Head MRI with and without contrast on 07/26/2019 revealed multiple white matter lesions in a distribution c/w multiple sclerosis, unchanged.  Cervical and thoracic  spine MRI with and without contrast on 07/26/2019 revealed a small hyperintensity overlying the right hemicord at the C5-6 level which could be artifactual. Attention on follow-up was recommended. There was no abnormal enhancement. There were no thoracic cord lesions.  Labs on 07/18/2017 revealed: Hepatitis C antibody < 0.1.  Hepatitis B surface antibody 7.4 (immunity > 9.9).  Hepatitis B core antibody total negative.  Quantiferon gold negative.  Lyme IgG/IgM < 0.91 and IgM quantitative 1.08 (0-0.79).  JC virus antibody index was 3.01 (positive).  Hepatitis B surface antigen and hepatitis B core antibody were negative on 09/21/2017.  Hepatitis B surface antibody was reactive on 10/03/2018.  Hepatitis B core antibody total was negative on 10/03/2018.   Dr Karn Cassis office notes patient received hepatitis B vaccine on 09/01/2017.  She received Rituxan on 09/28/2017, 10/12/2017, 03/29/2018, 10/05/2018, and 06/04/2019.  The patient received the flu shot on 01/16/2020.  Symptomatically, she feels "good".  Right sided weakness and foot drop are stable. She wears a brace on her right foot. Every once in a while, she has trouble with tasks such as opening the fridge or washing dishes.  She has not had any "MS hugs" since her last visit. She has headaches everyday.  She has lost 9 lbs unintentionally since her last visit.  Plan: 1.   Labs today:  CBC with diff, CMP. 2.   Multiple sclerosis Clinically, symptoms appear fairly stable. Review interval imaging studies. She is tolerating Rituxan well. Rituxan 1000 mg today (8 months after last dose) . Continue Rituxan every 8 months. 3.   Rituxan today. 4.   RTC in 8 months for MD assessment, labs (CBC with diff, CMP) and Rituxan.  I discussed the assessment and treatment plan with the patient.  The patient was provided an opportunity to ask questions and all were answered.  The patient agreed with the plan and demonstrated an understanding of the  instructions.  The patient was advised to call back if the symptoms worsen or if the condition fails to improve as anticipated.   Rosey Bath, MD, PhD    02/11/2020, 9:58 AM  I, Danella Penton Tufford, am acting as a Neurosurgeon for [employer]. Merlene Pulling, MD.   I, Emmanual Gauthreaux C. Merlene Pulling, MD, have reviewed the above documentation for accuracy and completeness, and I agree with the above.

## 2020-02-11 ENCOUNTER — Other Ambulatory Visit: Payer: Self-pay

## 2020-02-11 ENCOUNTER — Inpatient Hospital Stay: Payer: POS | Attending: Hematology and Oncology

## 2020-02-11 ENCOUNTER — Inpatient Hospital Stay (HOSPITAL_BASED_OUTPATIENT_CLINIC_OR_DEPARTMENT_OTHER): Payer: POS | Admitting: Hematology and Oncology

## 2020-02-11 ENCOUNTER — Ambulatory Visit: Payer: POS

## 2020-02-11 ENCOUNTER — Encounter: Payer: Self-pay | Admitting: Hematology and Oncology

## 2020-02-11 ENCOUNTER — Other Ambulatory Visit: Payer: Self-pay | Admitting: Hematology and Oncology

## 2020-02-11 VITALS — BP 115/66 | HR 92 | Temp 96.3°F | Resp 16 | Wt 215.5 lb

## 2020-02-11 VITALS — BP 134/76 | HR 88

## 2020-02-11 DIAGNOSIS — G35 Multiple sclerosis: Secondary | ICD-10-CM | POA: Diagnosis not present

## 2020-02-11 DIAGNOSIS — Z5112 Encounter for antineoplastic immunotherapy: Secondary | ICD-10-CM | POA: Insufficient documentation

## 2020-02-11 LAB — CBC WITH DIFFERENTIAL/PLATELET
Abs Immature Granulocytes: 0.02 10*3/uL (ref 0.00–0.07)
Basophils Absolute: 0.1 10*3/uL (ref 0.0–0.1)
Basophils Relative: 1 %
Eosinophils Absolute: 0 10*3/uL (ref 0.0–0.5)
Eosinophils Relative: 0 %
HCT: 38.6 % (ref 36.0–46.0)
Hemoglobin: 12.5 g/dL (ref 12.0–15.0)
Immature Granulocytes: 0 %
Lymphocytes Relative: 30 %
Lymphs Abs: 2.1 10*3/uL (ref 0.7–4.0)
MCH: 28.7 pg (ref 26.0–34.0)
MCHC: 32.4 g/dL (ref 30.0–36.0)
MCV: 88.5 fL (ref 80.0–100.0)
Monocytes Absolute: 0.6 10*3/uL (ref 0.1–1.0)
Monocytes Relative: 8 %
Neutro Abs: 4.3 10*3/uL (ref 1.7–7.7)
Neutrophils Relative %: 61 %
Platelets: 306 10*3/uL (ref 150–400)
RBC: 4.36 MIL/uL (ref 3.87–5.11)
RDW: 13.2 % (ref 11.5–15.5)
WBC: 7 10*3/uL (ref 4.0–10.5)
nRBC: 0 % (ref 0.0–0.2)

## 2020-02-11 LAB — COMPREHENSIVE METABOLIC PANEL
ALT: 12 U/L (ref 0–44)
AST: 13 U/L — ABNORMAL LOW (ref 15–41)
Albumin: 4.1 g/dL (ref 3.5–5.0)
Alkaline Phosphatase: 42 U/L (ref 38–126)
Anion gap: 15 (ref 5–15)
BUN: 14 mg/dL (ref 6–20)
CO2: 19 mmol/L — ABNORMAL LOW (ref 22–32)
Calcium: 8.9 mg/dL (ref 8.9–10.3)
Chloride: 103 mmol/L (ref 98–111)
Creatinine, Ser: 0.73 mg/dL (ref 0.44–1.00)
GFR, Estimated: >60 mL/min (ref >60)
Glucose, Bld: 110 mg/dL — ABNORMAL HIGH (ref 70–99)
Potassium: 3.8 mmol/L (ref 3.5–5.1)
Sodium: 137 mmol/L (ref 135–145)
Total Bilirubin: 0.9 mg/dL (ref 0.3–1.2)
Total Protein: 7.6 g/dL (ref 6.5–8.1)

## 2020-02-11 MED ORDER — ACETAMINOPHEN 500 MG PO TABS
1000.0000 mg | ORAL_TABLET | Freq: Once | ORAL | Status: AC
  Administered 2020-02-11: 1000 mg via ORAL
  Filled 2020-02-11: qty 2

## 2020-02-11 MED ORDER — SODIUM CHLORIDE 0.9 % IV SOLN
100.0000 mg | Freq: Once | INTRAVENOUS | Status: DC
  Filled 2020-02-11: qty 0.8

## 2020-02-11 MED ORDER — SODIUM CHLORIDE 0.9 % IV SOLN
Freq: Once | INTRAVENOUS | Status: AC
  Filled 2020-02-11: qty 250

## 2020-02-11 MED ORDER — METHYLPREDNISOLONE SODIUM SUCC 125 MG IJ SOLR
INTRAMUSCULAR | Status: AC
  Filled 2020-02-11: qty 2

## 2020-02-11 MED ORDER — METHYLPREDNISOLONE SODIUM SUCC 125 MG IJ SOLR
100.0000 mg | Freq: Once | INTRAMUSCULAR | Status: AC
  Administered 2020-02-11: 100 mg via INTRAVENOUS

## 2020-02-11 MED ORDER — SODIUM CHLORIDE 0.9 % IV SOLN
1000.0000 mg | Freq: Once | INTRAVENOUS | Status: AC
  Administered 2020-02-11: 1000 mg via INTRAVENOUS
  Filled 2020-02-11: qty 100

## 2020-02-11 MED ORDER — DIPHENHYDRAMINE HCL 25 MG PO CAPS
50.0000 mg | ORAL_CAPSULE | Freq: Once | ORAL | Status: AC
  Administered 2020-02-11: 50 mg via ORAL
  Filled 2020-02-11: qty 2

## 2020-02-11 NOTE — Progress Notes (Signed)
Patient here for oncology follow-up appointment, expresses no complaints or concerns at this time.    

## 2020-02-11 NOTE — Progress Notes (Signed)
Patient received prescribed treatment in clinic. Rituxan over 5 hours. Tolerated well. Patient stable at discharge.

## 2020-05-19 ENCOUNTER — Ambulatory Visit: Payer: POS | Admitting: Internal Medicine

## 2020-05-19 ENCOUNTER — Other Ambulatory Visit: Payer: Self-pay

## 2020-05-19 ENCOUNTER — Encounter: Payer: Self-pay | Admitting: Internal Medicine

## 2020-05-19 VITALS — BP 116/72 | HR 82 | Temp 98.2°F | Ht 65.0 in | Wt 224.0 lb

## 2020-05-19 DIAGNOSIS — R399 Unspecified symptoms and signs involving the genitourinary system: Secondary | ICD-10-CM | POA: Diagnosis not present

## 2020-05-19 LAB — POCT URINALYSIS DIPSTICK
Bilirubin, UA: NEGATIVE
Blood, UA: NEGATIVE
Glucose, UA: NEGATIVE
Ketones, UA: NEGATIVE
Leukocytes, UA: NEGATIVE
Nitrite, UA: NEGATIVE
Protein, UA: NEGATIVE
Spec Grav, UA: 1.015 (ref 1.010–1.025)
Urobilinogen, UA: 2 E.U./dL — AB
pH, UA: 7 (ref 5.0–8.0)

## 2020-05-19 MED ORDER — NITROFURANTOIN MONOHYD MACRO 100 MG PO CAPS: 100.0000 mg | ORAL_CAPSULE | Freq: Two times a day (BID) | ORAL | 0 refills | Status: AC

## 2020-05-19 NOTE — Progress Notes (Signed)
Date:  05/19/2020   Name:  Carly Martin   DOB:  1985/03/29   MRN:  [id number]   Chief Complaint: Urinary Tract Infection (X4 days, urinary urgency with small amount, odor, cloudy)  Urinary Tract Infection  This is a new problem. The current episode started in the past 7 days. The problem occurs every urination. The patient is experiencing no pain. There has been no fever. Associated symptoms include frequency and urgency. Pertinent negatives include no chills, hematuria or vomiting. Associated symptoms comments: Cloudy urine with foul odor. She has tried increased fluids for the symptoms. The treatment provided no relief.  She took AZO for one day with improvement then sx recurred more impressively yesterday. Urgency and frequency but no dysuria.  Lab Results  Component Value Date   CREATININE 0.73 02/11/2020   BUN 14 02/11/2020   NA 137 02/11/2020   K 3.8 02/11/2020   CL 103 02/11/2020   CO2 19 (L) 02/11/2020   Lab Results  Component Value Date   CHOL 165 03/18/2015   HDL 36 (L) 03/18/2015   LDLCALC 120 (H) 03/18/2015   TRIG 46 03/18/2015   CHOLHDL 4.6 (H) 03/18/2015   Lab Results  Component Value Date   TSH 1.320 03/03/2016   Lab Results  Component Value Date   HGBA1C 5.6 03/18/2015   Lab Results  Component Value Date   WBC 7.0 02/11/2020   HGB 12.5 02/11/2020   HCT 38.6 02/11/2020   MCV 88.5 02/11/2020   PLT 306 02/11/2020   Lab Results  Component Value Date   ALT 12 02/11/2020   AST 13 (L) 02/11/2020   ALKPHOS 42 02/11/2020   BILITOT 0.9 02/11/2020     Review of Systems  Constitutional: Negative for chills, fatigue and fever.  Respiratory: Negative for chest tightness and shortness of breath.   Gastrointestinal: Negative for abdominal pain, diarrhea and vomiting.  Genitourinary: Positive for frequency and urgency. Negative for difficulty urinating, dysuria and hematuria.    Patient Active Problem List   Diagnosis Date Noted  . Foot drop,  right 08/15/2019  . Multiple sclerosis (HCC) 07/03/2017  . Right upper quadrant abdominal pain 09/28/2016  . Irritable bowel syndrome with constipation 09/28/2016  . Lightheadedness 03/03/2016  . Numbness and tingling 09/03/2015  . Chronic tension-type headache, not intractable 09/03/2015  . Sciatica of right side 03/17/2015  . Iron deficiency anemia 03/17/2015  . History of loop electrical excision procedure (LEEP) 09/28/2013    Allergies  Allergen Reactions  . Codeine Shortness Of Breath    Past Surgical History:  Procedure Laterality Date  . CERVICAL BIOPSY  W/ LOOP ELECTRODE EXCISION  2008  . TONSILLECTOMY      Social History   Tobacco Use  . Smoking status: Current Every Day Smoker    Packs/day: 0.25    Years: 10.00    Pack years: 2.50    Types: Cigarettes  . Smokeless tobacco: Never Used  Vaping Use  . Vaping Use: Never used  Substance Use Topics  . Alcohol use: Yes    Alcohol/week: 0.0 standard drinks    Comment: socially  . Drug use: No     Medication list has been reviewed and updated.  Current Meds  Medication Sig  . albuterol (VENTOLIN HFA) 108 (90 Base) MCG/ACT inhaler Inhale 2 puffs into the lungs every 6 (six) hours as needed for wheezing or shortness of breath.  . Ascorbic Acid (VITAMIN C) 1000 MG tablet Take 1,000 mg by mouth daily.  Marland Kitchen  etonogestrel (NEXPLANON) 68 MG IMPL implant 1 each by Subdermal route once.  . fluticasone (FLONASE) 50 MCG/ACT nasal spray Place 2 sprays into both nostrils daily. (Patient taking differently: Place 2 sprays into both nostrils as needed.)  . hyoscyamine (LEVSIN SL) 0.125 MG SL tablet Place 1 tablet (0.125 mg total) under the tongue every 4 (four) hours as needed.  Marland Kitchen ibuprofen (ADVIL,MOTRIN) 800 MG tablet Take 1 tablet (800 mg total) by mouth every 8 (eight) hours as needed.  . modafinil (PROVIGIL) 100 MG tablet Take 100 mg by mouth as needed.  . Multiple Minerals (CALCIUM/MAGNESIUM/ZINC PO) Take 1 tablet by mouth  daily.  . Multiple Vitamin (MULTI-VITAMIN) tablet Take 1 tablet by mouth daily.   . riTUXimab in sodium chloride 0.9 % 250 mL Inject 1,000 mg into the vein every 6 (six) months.  . Vitamin D, Ergocalciferol, (DRISDOL) 1.25 MG (50000 UNIT) CAPS capsule Take 1 capsule by mouth once a week.    PHQ 2/9 Scores 05/19/2020 02/04/2020 08/21/2019 08/03/2019  PHQ - 2 Score 0 0 0 0  PHQ- 9 Score 0 0 0 0    GAD 7 : Generalized Anxiety Score 05/19/2020 02/04/2020 08/21/2019 08/03/2019  Nervous, Anxious, on Edge 0 0 0 0  Control/stop worrying 0 0 0 0  Worry too much - different things 0 0 0 0  Trouble relaxing 0 0 0 0  Restless 0 0 0 0  Easily annoyed or irritable 0 0 0 0  Afraid - awful might happen 0 0 0 0  Total GAD 7 Score 0 0 0 0  Anxiety Difficulty - Not difficult at all Not difficult at all Not difficult at all    BP Readings from Last 3 Encounters:  05/19/20 116/72  02/11/20 134/76  02/11/20 115/66    Physical Exam Vitals and nursing note reviewed.  Constitutional:      Appearance: She is well-developed.  Cardiovascular:     Rate and Rhythm: Normal rate and regular rhythm.     Heart sounds: Normal heart sounds.  Pulmonary:     Effort: Pulmonary effort is normal. No respiratory distress.     Breath sounds: Normal breath sounds.  Abdominal:     General: Bowel sounds are normal.     Palpations: Abdomen is soft.     Tenderness: There is no abdominal tenderness. There is no guarding or rebound.     Wt Readings from Last 3 Encounters:  05/19/20 224 lb (101.6 kg)  02/11/20 215 lb 8 oz (97.7 kg)  02/04/20 218 lb (98.9 kg)    BP 116/72   Pulse 82   Temp 98.2 F (36.8 C) (Oral)   Ht 5\' 5"  (1.651 m)   Wt 224 lb (101.6 kg)   SpO2 100%   BMI 37.28 kg/m   Assessment and Plan: 1. UTI symptoms Suspect partially treated UTI Continue fluids; macrobid x 7 days  Follow up if needed - POCT urinalysis dipstick - nitrofurantoin, macrocrystal-monohydrate, (MACROBID) 100 MG capsule; Take 1  capsule (100 mg total) by mouth 2 (two) times daily for 7 days.  Dispense: 14 capsule; Refill: 0   Partially dictated using . Any errors are unintentional.  Animal nutritionist, MD [facility]  05/19/2020

## 2020-06-13 ENCOUNTER — Other Ambulatory Visit: Payer: Self-pay

## 2020-06-13 ENCOUNTER — Ambulatory Visit: Payer: POS | Admitting: Internal Medicine

## 2020-06-13 ENCOUNTER — Encounter: Payer: Self-pay | Admitting: Internal Medicine

## 2020-06-13 VITALS — BP 108/68 | HR 82 | Temp 98.2°F | Ht 65.0 in | Wt 220.0 lb

## 2020-06-13 DIAGNOSIS — R21 Rash and other nonspecific skin eruption: Secondary | ICD-10-CM | POA: Diagnosis not present

## 2020-06-13 MED ORDER — CLOTRIMAZOLE-BETAMETHASONE 1-0.05 % EX CREA: 1.0000 "application " | TOPICAL_CREAM | Freq: Every day | CUTANEOUS | 1 refills | Status: DC

## 2020-06-13 MED ORDER — FLUCONAZOLE 100 MG PO TABS: 100.0000 mg | ORAL_TABLET | Freq: Every day | ORAL | 0 refills | Status: AC

## 2020-06-13 NOTE — Progress Notes (Signed)
Date:  06/13/2020   Name:  Carly Martin   DOB:  06-29-1985   MRN:  [id number]   Chief Complaint: Rash (Itchy Rash on Back. 6 years. Comes and Goes. This time she used her daughters triamcinolone cream on it like she has before - now its spreading. Been using anti fungal over the counter creams and its still spreading. Itching bad. )  Rash This is a recurrent problem. The problem has been gradually worsening since onset. The affected locations include the back. The rash is characterized by itchiness. She was exposed to nothing. Pertinent negatives include no fatigue, fever or shortness of breath. Past treatments include topical steroids and anti-itch cream. The treatment provided mild (but now the rash is spreading) relief.    Lab Results  Component Value Date   CREATININE 0.73 02/11/2020   BUN 14 02/11/2020   NA 137 02/11/2020   K 3.8 02/11/2020   CL 103 02/11/2020   CO2 19 (L) 02/11/2020   Lab Results  Component Value Date   CHOL 165 03/18/2015   HDL 36 (L) 03/18/2015   LDLCALC 120 (H) 03/18/2015   TRIG 46 03/18/2015   CHOLHDL 4.6 (H) 03/18/2015   Lab Results  Component Value Date   TSH 1.320 03/03/2016   Lab Results  Component Value Date   HGBA1C 5.6 03/18/2015   Lab Results  Component Value Date   WBC 7.0 02/11/2020   HGB 12.5 02/11/2020   HCT 38.6 02/11/2020   MCV 88.5 02/11/2020   PLT 306 02/11/2020   Lab Results  Component Value Date   ALT 12 02/11/2020   AST 13 (L) 02/11/2020   ALKPHOS 42 02/11/2020   BILITOT 0.9 02/11/2020     Review of Systems  Constitutional: Negative for chills, fatigue and fever.  Respiratory: Negative for chest tightness and shortness of breath.   Cardiovascular: Negative for chest pain.  Skin: Positive for color change and rash.  Psychiatric/Behavioral: Positive for sleep disturbance (due to itching).    Patient Active Problem List   Diagnosis Date Noted  . Foot drop, right 08/15/2019  . Multiple sclerosis (HCC)  07/03/2017  . Right upper quadrant abdominal pain 09/28/2016  . Irritable bowel syndrome with constipation 09/28/2016  . Lightheadedness 03/03/2016  . Numbness and tingling 09/03/2015  . Chronic tension-type headache, not intractable 09/03/2015  . Sciatica of right side 03/17/2015  . Iron deficiency anemia 03/17/2015  . History of loop electrical excision procedure (LEEP) 09/28/2013    Allergies  Allergen Reactions  . Codeine Shortness Of Breath    Past Surgical History:  Procedure Laterality Date  . CERVICAL BIOPSY  W/ LOOP ELECTRODE EXCISION  2008  . TONSILLECTOMY      Social History   Tobacco Use  . Smoking status: Current Every Day Smoker    Packs/day: 0.25    Years: 10.00    Pack years: 2.50    Types: Cigarettes  . Smokeless tobacco: Never Used  Vaping Use  . Vaping Use: Never used  Substance Use Topics  . Alcohol use: Yes    Alcohol/week: 0.0 standard drinks    Comment: socially  . Drug use: No     Medication list has been reviewed and updated.  Current Meds  Medication Sig  . albuterol (VENTOLIN HFA) 108 (90 Base) MCG/ACT inhaler Inhale 2 puffs into the lungs every 6 (six) hours as needed for wheezing or shortness of breath.  . Ascorbic Acid (VITAMIN C) 1000 MG tablet Take 1,000 mg  by mouth daily.  Marland Kitchen etonogestrel (NEXPLANON) 68 MG IMPL implant 1 each by Subdermal route once.  . fluticasone (FLONASE) 50 MCG/ACT nasal spray Place 2 sprays into both nostrils daily. (Patient taking differently: Place 2 sprays into both nostrils as needed.)  . hyoscyamine (LEVSIN SL) 0.125 MG SL tablet Place 1 tablet (0.125 mg total) under the tongue every 4 (four) hours as needed.  Marland Kitchen ibuprofen (ADVIL,MOTRIN) 800 MG tablet Take 1 tablet (800 mg total) by mouth every 8 (eight) hours as needed.  . Multiple Minerals (CALCIUM/MAGNESIUM/ZINC PO) Take 1 tablet by mouth daily.  . Multiple Vitamin (MULTI-VITAMIN) tablet Take 1 tablet by mouth daily.   . riTUXimab in sodium chloride 0.9  % 250 mL Inject 1,000 mg into the vein every 6 (six) months.  . Vitamin D, Ergocalciferol, (DRISDOL) 1.25 MG (50000 UNIT) CAPS capsule Take 1 capsule by mouth once a week.    PHQ 2/9 Scores 05/19/2020 02/04/2020 08/21/2019 08/03/2019  PHQ - 2 Score 0 0 0 0  PHQ- 9 Score 0 0 0 0    GAD 7 : Generalized Anxiety Score 05/19/2020 02/04/2020 08/21/2019 08/03/2019  Nervous, Anxious, on Edge 0 0 0 0  Control/stop worrying 0 0 0 0  Worry too much - different things 0 0 0 0  Trouble relaxing 0 0 0 0  Restless 0 0 0 0  Easily annoyed or irritable 0 0 0 0  Afraid - awful might happen 0 0 0 0  Total GAD 7 Score 0 0 0 0  Anxiety Difficulty - Not difficult at all Not difficult at all Not difficult at all    BP Readings from Last 3 Encounters:  06/13/20 108/68  05/19/20 116/72  02/11/20 134/76    Physical Exam Vitals and nursing note reviewed.  Constitutional:      General: She is not in acute distress.    Appearance: She is well-developed.  HENT:     Head: Normocephalic and atraumatic.  Cardiovascular:     Rate and Rhythm: Normal rate and regular rhythm.  Pulmonary:     Effort: Pulmonary effort is normal. No respiratory distress.     Breath sounds: No wheezing or rhonchi.  Skin:    General: Skin is warm and dry.     Findings: Rash present. Rash is macular.          Comments: Fine macular hyperpigmented rash  Neurological:     Mental Status: She is alert and oriented to person, place, and time.  Psychiatric:        Mood and Affect: Mood normal.        Behavior: Behavior normal.     Wt Readings from Last 3 Encounters:  06/13/20 220 lb (99.8 kg)  05/19/20 224 lb (101.6 kg)  02/11/20 215 lb 8 oz (97.7 kg)    BP 108/68   Pulse 82   Temp 98.2 F (36.8 C) (Oral)   Ht 5\' 5"  (1.651 m)   Wt 220 lb (99.8 kg)   LMP 06/01/2020 (Exact Date)   SpO2 98%   BMI 36.61 kg/m   Assessment and Plan: 1. 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  Dispense: 7 tablet; Refill: 0 - clotrimazole-betamethasone (LOTRISONE) cream; Apply 1 application topically daily.  Dispense: 45 g; Refill:  1   Partially dictated using Animal nutritionist. Any errors are unintentional.  Bari Edward, MD [facility]  06/13/2020

## 2020-07-15 ENCOUNTER — Encounter: Payer: Self-pay | Admitting: Internal Medicine

## 2020-07-21 ENCOUNTER — Encounter: Payer: Self-pay | Admitting: Internal Medicine

## 2020-07-21 ENCOUNTER — Ambulatory Visit (INDEPENDENT_AMBULATORY_CARE_PROVIDER_SITE_OTHER): Payer: POS | Admitting: Internal Medicine

## 2020-07-21 ENCOUNTER — Other Ambulatory Visit: Payer: Self-pay

## 2020-07-21 VITALS — BP 132/80 | HR 97 | Temp 97.4°F | Ht 65.0 in | Wt 223.0 lb

## 2020-07-21 DIAGNOSIS — F411 Generalized anxiety disorder: Secondary | ICD-10-CM | POA: Diagnosis not present

## 2020-07-21 DIAGNOSIS — N3000 Acute cystitis without hematuria: Secondary | ICD-10-CM

## 2020-07-21 DIAGNOSIS — K581 Irritable bowel syndrome with constipation: Secondary | ICD-10-CM | POA: Diagnosis not present

## 2020-07-21 DIAGNOSIS — K219 Gastro-esophageal reflux disease without esophagitis: Secondary | ICD-10-CM

## 2020-07-21 DIAGNOSIS — D509 Iron deficiency anemia, unspecified: Secondary | ICD-10-CM

## 2020-07-21 DIAGNOSIS — Z0000 Encounter for general adult medical examination without abnormal findings: Secondary | ICD-10-CM

## 2020-07-21 DIAGNOSIS — G378 Other specified demyelinating diseases of central nervous system: Secondary | ICD-10-CM

## 2020-07-21 DIAGNOSIS — E559 Vitamin D deficiency, unspecified: Secondary | ICD-10-CM | POA: Insufficient documentation

## 2020-07-21 LAB — POC URINALYSIS WITH MICROSCOPIC (NON AUTO)MANUAL RESULT
Bilirubin, UA: NEGATIVE
Blood, UA: NEGATIVE
Glucose, UA: NEGATIVE
Ketones, UA: NEGATIVE
Mucus, UA: 0
Nitrite, UA: POSITIVE
Protein, UA: POSITIVE — AB
RBC: 0 M/uL — AB (ref 4.04–5.48)
Spec Grav, UA: >=1.03 — AB (ref 1.010–1.025)
Urobilinogen, UA: 0.2 E.U./dL
pH, UA: 6 (ref 5.0–8.0)

## 2020-07-21 MED ORDER — FAMOTIDINE 40 MG PO TABS: 40.0000 mg | ORAL_TABLET | Freq: Every day | ORAL | 0 refills | Status: DC

## 2020-07-21 MED ORDER — SULFAMETHOXAZOLE-TRIMETHOPRIM 800-160 MG PO TABS: 1.0000 | ORAL_TABLET | Freq: Two times a day (BID) | ORAL | 0 refills | Status: AC

## 2020-07-21 NOTE — Progress Notes (Signed)
Date:  07/21/2020   Name:  Carly Martin   DOB:  12/28/1985   MRN:  [id number]   Chief Complaint: Annual Exam (Breast exam no pap) and Urinary Tract Infection (X2 days, Foul odor, no burning or itching) EMMAGENE Martin is a 35 y.o. female who presents today for her Complete Annual Exam. She feels well. She reports exercising walking x3 days a week. She reports she is sleeping well. Breast complaints none.  Mammogram: not due Pap smear: 01/2020 reported normal Colonoscopy: not due  Immunization History  Administered Date(s) Administered   Hepatitis B, adult 09/01/2017   Influenza,inj,Quad PF,6+ Mos 09/13/2018, 01/16/2020   Influenza-Unspecified 01/06/2015, 01/16/2020   PFIZER(Purple Top)SARS-COV-2 Vaccination 03/02/2019, 03/23/2019, 01/27/2020   Tdap 03/18/2014    Urinary Tract Infection  This is a new problem. The problem has been unchanged. There has been no fever. Associated symptoms include frequency and vomiting. Pertinent negatives include no chills. Associated symptoms comments: And odor.  Gastroesophageal Reflux She complains of heartburn and water brash. She reports no abdominal pain, no chest pain, no coughing or no wheezing. This is a recurrent problem. The problem occurs frequently. The problem has been gradually worsening. Pertinent negatives include no fatigue. She has tried an antacid for the symptoms.   Lab Results  Component Value Date   CREATININE 0.73 02/11/2020   BUN 14 02/11/2020   NA 137 02/11/2020   K 3.8 02/11/2020   CL 103 02/11/2020   CO2 19 (L) 02/11/2020   Lab Results  Component Value Date   CHOL 165 03/18/2015   HDL 36 (L) 03/18/2015   LDLCALC 120 (H) 03/18/2015   TRIG 46 03/18/2015   CHOLHDL 4.6 (H) 03/18/2015   Lab Results  Component Value Date   TSH 1.320 03/03/2016   Lab Results  Component Value Date   HGBA1C 5.6 03/18/2015   Lab Results  Component Value Date   WBC 7.0 02/11/2020   HGB 12.5 02/11/2020   HCT 38.6  02/11/2020   MCV 88.5 02/11/2020   PLT 306 02/11/2020   Lab Results  Component Value Date   ALT 12 02/11/2020   AST 13 (L) 02/11/2020   ALKPHOS 42 02/11/2020   BILITOT 0.9 02/11/2020     Review of Systems  Constitutional:  Negative for chills, fatigue and fever.  HENT:  Negative for congestion, hearing loss, tinnitus, trouble swallowing and voice change.   Eyes:  Negative for visual disturbance.  Respiratory:  Negative for cough, chest tightness, shortness of breath and wheezing.   Cardiovascular:  Negative for chest pain, palpitations and leg swelling.  Gastrointestinal:  Positive for heartburn and vomiting. Negative for abdominal pain, constipation and diarrhea.  Endocrine: Negative for polydipsia and polyuria.  Genitourinary:  Positive for frequency. Negative for dysuria, genital sores, vaginal bleeding and vaginal discharge.  Musculoskeletal:  Negative for arthralgias, gait problem and joint swelling.  Skin:  Negative for color change and rash.  Neurological:  Negative for dizziness, tremors, light-headedness and headaches.  Hematological:  Negative for adenopathy. Does not bruise/bleed easily.  Psychiatric/Behavioral:  Negative for dysphoric mood and sleep disturbance. The patient is not nervous/anxious.    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   Right upper quadrant abdominal pain 09/28/2016   Irritable bowel syndrome with constipation 09/28/2016   Lightheadedness 03/03/2016   Numbness  and tingling 09/03/2015   Chronic tension-type headache, not intractable 09/03/2015   Sciatica of right side 03/17/2015   Iron deficiency anemia 03/17/2015   History of loop electrical excision procedure (LEEP) 09/28/2013    Allergies  Allergen Reactions   Codeine Shortness Of Breath    Past  Surgical History:  Procedure Laterality Date   CERVICAL BIOPSY  W/ LOOP ELECTRODE EXCISION  2008   TONSILLECTOMY      Social History   Tobacco Use   Smoking status: Former    Packs/day: 0.25    Years: 10.00    Pack years: 2.50    Types: Cigarettes    Quit date: 06/16/2020    Years since quitting: 0.0   Smokeless tobacco: Never  Vaping Use   Vaping Use: Never used  Substance Use Topics   Alcohol use: Yes    Alcohol/week: 0.0 standard drinks    Comment: socially   Drug use: No     Medication list has been reviewed and updated.  Current Meds  Medication Sig   albuterol (VENTOLIN HFA) 108 (90 Base) MCG/ACT inhaler Inhale 2 puffs into the lungs every 6 (six) hours as needed for wheezing or shortness of breath.   Ascorbic Acid (VITAMIN C) 1000 MG tablet Take 1,000 mg by mouth daily.   clotrimazole-betamethasone (LOTRISONE) cream Apply 1 application topically daily.   etonogestrel (NEXPLANON) 68 MG IMPL implant 1 each by Subdermal route once.   famotidine (PEPCID) 40 MG tablet Take 1 tablet (40 mg total) by mouth daily.   fluticasone (FLONASE) 50 MCG/ACT nasal spray Place 2 sprays into both nostrils daily. (Patient taking differently: Place 2 sprays into both nostrils as needed.)   hyoscyamine (LEVSIN SL) 0.125 MG SL tablet Place 1 tablet (0.125 mg total) under the tongue every 4 (four) hours as needed.   ibuprofen (ADVIL,MOTRIN) 800 MG tablet Take 1 tablet (800 mg total) by mouth every 8 (eight) hours as needed.   Multiple Minerals (CALCIUM/MAGNESIUM/ZINC PO) Take 1 tablet by mouth daily.   Multiple Vitamin (MULTI-VITAMIN) tablet Take 1 tablet by mouth daily.    riTUXimab in sodium chloride 0.9 % 250 mL Inject 1,000 mg into the vein every 6 (six) months.   sulfamethoxazole-trimethoprim (BACTRIM DS) 800-160 MG tablet Take 1 tablet by mouth 2 (two) times daily for 7 days.   Vitamin D, Ergocalciferol, (DRISDOL) 1.25 MG (50000 UNIT) CAPS capsule Take 1 capsule by mouth once a week.     PHQ 2/9 Scores 07/21/2020 06/13/2020 05/19/2020 02/04/2020  PHQ - 2 Score 0 0 0 0  PHQ- 9 Score 2 3 0 0    GAD 7 : Generalized Anxiety Score 07/21/2020 06/13/2020 05/19/2020 02/04/2020  Nervous, Anxious, on Edge 0 1 0 0  Control/stop worrying 0 0 0 0  Worry too much - different things 0 1 0 0  Trouble relaxing 0 0 0 0  Restless 0 0 0 0  Easily annoyed or irritable 0 1 0 0  Afraid - awful might happen 0 0 0 0  Total GAD 7 Score 0 3 0 0  Anxiety Difficulty Not difficult at all Not difficult at all - Not difficult at all    BP Readings from Last 3 Encounters:  07/21/20 132/80  06/13/20 108/68  05/19/20 116/72    Physical Exam Vitals and nursing note reviewed.  Constitutional:      General: She is not in acute distress.    Appearance: She is well-developed.  HENT:     Head: Normocephalic  and atraumatic.     Right Ear: Tympanic membrane and ear canal normal.     Left Ear: Tympanic membrane and ear canal normal.     Nose:     Right Sinus: No maxillary sinus tenderness.     Left Sinus: No maxillary sinus tenderness.  Eyes:     General: No scleral icterus.       Right eye: No discharge.        Left eye: No discharge.     Conjunctiva/sclera: Conjunctivae normal.  Neck:     Thyroid: No thyromegaly.     Vascular: No carotid bruit.  Cardiovascular:     Rate and Rhythm: Normal rate and regular rhythm.     Pulses: Normal pulses.     Heart sounds: Normal heart sounds.  Pulmonary:     Effort: Pulmonary effort is normal. No respiratory distress.     Breath sounds: No wheezing.  Chest:  Breasts:    Right: No mass, nipple discharge, skin change or tenderness.     Left: No mass, nipple discharge, skin change or tenderness.  Abdominal:     General: Bowel sounds are normal.     Palpations: Abdomen is soft.     Tenderness: There is no abdominal tenderness.  Musculoskeletal:     Cervical back: Normal range of motion. No erythema.     Right lower leg: No edema.     Left lower leg: No  edema.  Lymphadenopathy:     Cervical: No cervical adenopathy.  Skin:    General: Skin is warm and dry.     Findings: No rash.  Neurological:     Mental Status: She is alert and oriented to person, place, and time.     Cranial Nerves: No cranial nerve deficit.     Sensory: No sensory deficit.     Deep Tendon Reflexes: Reflexes are normal and symmetric.  Psychiatric:        Attention and Perception: Attention normal.        Mood and Affect: Mood normal.    Wt Readings from Last 3 Encounters:  07/21/20 223 lb (101.2 kg)  06/13/20 220 lb (99.8 kg)  05/19/20 224 lb (101.6 kg)    BP 132/80   Pulse 97   Temp (!) 97.4 F (36.3 C) (Oral)   Ht 5\' 5"  (1.651 m)   Wt 223 lb (101.2 kg)   SpO2 100%   BMI 37.11 kg/m   Assessment and Plan: 1. Annual physical exam Exam is normal except for weight. Encourage regular exercise and appropriate dietary changes. - Comprehensive metabolic panel - Lipid panel - TSH - Vitamin B12 - Hemoglobin A1c  2. Iron deficiency anemia, unspecified iron deficiency anemia type In the past with pregnancy - recent CBC was normal  3. Irritable bowel syndrome with constipation Symptoms intermittent She sees Dr. Lars PinksWhol as needed  4. Anxiety state Doing well today  5. Vitamin D deficiency Now on high dose weekly supplement  6. 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 Acute cystitis without hematuria Will treat with Bactrim DS and send for culture - Urine Culture - POC urinalysis w microscopic (non auto) - sulfamethoxazole-trimethoprim (BACTRIM DS) 800-160 MG tablet; Take 1 tablet by mouth 2 (two) times daily for 7 days.  Dispense: 14 tablet; Refill: 0  8. Gastroesophageal reflux disease, unspecified whether esophagitis present New problem without red flag symptoms Begin daily Pepcid; continue PRN TUMS If no improvement, may  need to see GI for evaluation GERD  prevention strategies discussed - famotidine (PEPCID) 40 MG tablet; Take 1 tablet (40 mg total) by mouth daily.  Dispense: 90 tablet; Refill: 0   Partially dictated using Animal nutritionist. Any errors are unintentional.  Bari Edward, MD [facility]  07/21/2020

## 2020-07-22 ENCOUNTER — Other Ambulatory Visit: Payer: Self-pay | Admitting: Internal Medicine

## 2020-07-23 ENCOUNTER — Encounter: Payer: Self-pay | Admitting: Hematology and Oncology

## 2020-07-23 ENCOUNTER — Encounter: Payer: Self-pay | Admitting: Internal Medicine

## 2020-07-23 DIAGNOSIS — E785 Hyperlipidemia, unspecified: Secondary | ICD-10-CM | POA: Insufficient documentation

## 2020-07-23 LAB — COMPREHENSIVE METABOLIC PANEL
ALT: 15 IU/L (ref 0–32)
AST: 14 IU/L (ref 0–40)
Albumin/Globulin Ratio: 1.5 (ref 1.2–2.2)
Albumin: 4.1 g/dL (ref 3.8–4.8)
Alkaline Phosphatase: 60 IU/L (ref 44–121)
BUN/Creatinine Ratio: 14 (ref 9–23)
BUN: 11 mg/dL (ref 6–20)
Bilirubin Total: 0.7 mg/dL (ref 0.0–1.2)
CO2: 21 mmol/L (ref 20–29)
Calcium: 9.2 mg/dL (ref 8.7–10.2)
Chloride: 102 mmol/L (ref 96–106)
Creatinine, Ser: 0.78 mg/dL (ref 0.57–1.00)
Globulin, Total: 2.7 g/dL (ref 1.5–4.5)
Glucose: 89 mg/dL (ref 65–99)
Potassium: 4.3 mmol/L (ref 3.5–5.2)
Sodium: 137 mmol/L (ref 134–144)
Total Protein: 6.8 g/dL (ref 6.0–8.5)
eGFR: 101 mL/min/{1.73_m2} (ref >59)

## 2020-07-23 LAB — LIPID PANEL
Chol/HDL Ratio: 4.9 ratio — ABNORMAL HIGH (ref 0.0–4.4)
Cholesterol, Total: 176 mg/dL (ref 100–199)
HDL: 36 mg/dL — ABNORMAL LOW (ref >39)
LDL Chol Calc (NIH): 127 mg/dL — ABNORMAL HIGH (ref 0–99)
Triglycerides: 71 mg/dL (ref 0–149)
VLDL Cholesterol Cal: 13 mg/dL (ref 5–40)

## 2020-07-23 LAB — VITAMIN B12: Vitamin B-12: 646 pg/mL (ref 232–1245)

## 2020-07-23 LAB — HEMOGLOBIN A1C
Est. average glucose Bld gHb Est-mCnc: 117 mg/dL
Hgb A1c MFr Bld: 5.7 % — ABNORMAL HIGH (ref 4.8–5.6)

## 2020-07-23 LAB — TSH: TSH: 1.39 u[IU]/mL (ref 0.450–4.500)

## 2020-07-25 LAB — URINE CULTURE

## 2020-07-31 ENCOUNTER — Encounter: Payer: Self-pay | Admitting: Internal Medicine

## 2020-08-05 ENCOUNTER — Ambulatory Visit (INDEPENDENT_AMBULATORY_CARE_PROVIDER_SITE_OTHER): Payer: POS | Admitting: Internal Medicine

## 2020-08-05 ENCOUNTER — Encounter: Payer: Self-pay | Admitting: Internal Medicine

## 2020-08-05 ENCOUNTER — Other Ambulatory Visit: Payer: Self-pay

## 2020-08-05 VITALS — BP 102/64 | HR 80 | Temp 98.2°F | Ht 65.0 in | Wt 221.0 lb

## 2020-08-05 DIAGNOSIS — E669 Obesity, unspecified: Secondary | ICD-10-CM | POA: Diagnosis not present

## 2020-08-05 MED ORDER — PHENTERMINE HCL 37.5 MG PO CAPS: 37.5000 mg | ORAL_CAPSULE | ORAL | 0 refills | Status: DC

## 2020-08-05 NOTE — Progress Notes (Signed)
Date:  08/05/2020   Name:  Carly Martin   DOB:  1986-01-20   MRN:  [id number]   Chief Complaint: Weight Loss (Has started going to a dietician will have visits every 2 weeks )  HPI Obesity and prediabetes - she has started changing her diet and limiting carbs. She has met with a dietician and has been trying intermittent fasting.  She finds that she has terrible cravings and can not stop eating.  Despite this, she has lost 2 lbs.  She would like to try an appetite suppressant.  Lab Results  Component Value Date   CREATININE 0.78 07/22/2020   BUN 11 07/22/2020   NA 137 07/22/2020   K 4.3 07/22/2020   CL 102 07/22/2020   CO2 21 07/22/2020   Lab Results  Component Value Date   CHOL 176 07/22/2020   HDL 36 (L) 07/22/2020   LDLCALC 127 (H) 07/22/2020   TRIG 71 07/22/2020   CHOLHDL 4.9 (H) 07/22/2020   Lab Results  Component Value Date   TSH 1.390 07/22/2020   Lab Results  Component Value Date   HGBA1C 5.7 (H) 07/22/2020   Lab Results  Component Value Date   WBC 7.0 02/11/2020   HGB 12.5 02/11/2020   HCT 38.6 02/11/2020   MCV 88.5 02/11/2020   PLT 306 02/11/2020   Lab Results  Component Value Date   ALT 15 07/22/2020   AST 14 07/22/2020   ALKPHOS 60 07/22/2020   BILITOT 0.7 07/22/2020     Review of Systems  Constitutional:  Negative for appetite change, fatigue, fever and unexpected weight change.  Respiratory:  Negative for chest tightness, shortness of breath and wheezing.   Cardiovascular:  Negative for chest pain and palpitations.  Neurological:  Negative for dizziness and headaches.   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    Lightheadedness 03/03/2016   Numbness and tingling 09/03/2015   Chronic tension-type headache, not intractable 09/03/2015   Sciatica of right side 03/17/2015   Iron deficiency anemia 03/17/2015   History of loop electrical excision procedure (LEEP) 09/28/2013    Allergies  Allergen Reactions   Codeine Shortness Of Breath    Past Surgical History:  Procedure Laterality Date   CERVICAL BIOPSY  W/ LOOP ELECTRODE EXCISION  2008   TONSILLECTOMY      Social History   Tobacco Use   Smoking status: Former    Packs/day: 0.25    Years: 10.00    Pack years: 2.50    Types: Cigarettes    Quit date: 06/16/2020    Years since quitting: 0.1   Smokeless tobacco: Never  Vaping Use   Vaping Use: Never used  Substance Use Topics   Alcohol use: Yes    Alcohol/week: 0.0 standard drinks    Comment: socially   Drug use: No     Medication list has been reviewed and updated.  Current Meds  Medication Sig   albuterol (VENTOLIN HFA) 108 (90 Base) MCG/ACT inhaler Inhale 2 puffs into the lungs every 6 (six) hours as needed for wheezing or shortness of breath.   clotrimazole-betamethasone (LOTRISONE) cream Apply 1 application topically daily.   etonogestrel (NEXPLANON) 68 MG IMPL implant  1 each by Subdermal route once.   famotidine (PEPCID) 40 MG tablet Take 1 tablet (40 mg total) by mouth daily.   fluticasone (FLONASE) 50 MCG/ACT nasal spray Place 2 sprays into both nostrils daily. (Patient taking differently: Place 2 sprays into both nostrils as needed.)   hyoscyamine (LEVSIN SL) 0.125 MG SL tablet Place 1 tablet (0.125 mg total) under the tongue every 4 (four) hours as needed.   ibuprofen (ADVIL,MOTRIN) 800 MG tablet Take 1 tablet (800 mg total) by mouth every 8 (eight) hours as needed.   Multiple Minerals (CALCIUM/MAGNESIUM/ZINC PO) Take 1 tablet by mouth daily.   Multiple Vitamin (MULTI-VITAMIN) tablet Take 1 tablet by mouth daily.    riTUXimab in sodium chloride 0.9 % 250 mL Inject 1,000  mg into the vein every 6 (six) months.   Vitamin D, Ergocalciferol, (DRISDOL) 1.25 MG (50000 UNIT) CAPS capsule Take 1 capsule by mouth once a week.    PHQ 2/9 Scores 08/05/2020 07/21/2020 06/13/2020 05/19/2020  PHQ - 2 Score 0 0 0 0  PHQ- 9 Score 0 2 3 0    GAD 7 : Generalized Anxiety Score 08/05/2020 07/21/2020 06/13/2020 05/19/2020  Nervous, Anxious, on Edge 0 0 1 0  Control/stop worrying 0 0 0 0  Worry too much - different things 0 0 1 0  Trouble relaxing 0 0 0 0  Restless 0 0 0 0  Easily annoyed or irritable 0 0 1 0  Afraid - awful might happen 0 0 0 0  Total GAD 7 Score 0 0 3 0  Anxiety Difficulty - Not difficult at all Not difficult at all -    BP Readings from Last 3 Encounters:  08/05/20 102/64  07/21/20 132/80  06/13/20 108/68    Physical Exam Vitals and nursing note reviewed.  Constitutional:      General: She is not in acute distress.    Appearance: Normal appearance. She is well-developed.  HENT:     Head: Normocephalic and atraumatic.  Cardiovascular:     Rate and Rhythm: Normal rate and regular rhythm.     Pulses: Normal pulses.     Heart sounds: No murmur heard. Pulmonary:     Effort: Pulmonary effort is normal. No respiratory distress.     Breath sounds: No wheezing or rhonchi.  Lymphadenopathy:     Cervical: No cervical adenopathy.  Skin:    General: Skin is warm and dry.     Findings: No rash.  Neurological:     Mental Status: She is alert and oriented to person, place, and time.  Psychiatric:        Mood and Affect: Mood normal.        Behavior: Behavior normal.    Wt Readings from Last 3 Encounters:  08/05/20 221 lb (100.2 kg)  07/21/20 223 lb (101.2 kg)  06/13/20 220 lb (99.8 kg)    BP 102/64   Pulse 80   Temp 98.2 F (36.8 C) (Oral)   Ht _0  (1.651 m)   Wt 221 lb (100.2 kg)   LMP 07/06/2020   SpO2 99%   BMI 36.78 kg/m   Assessment and Plan: 1. Obesity (BMI 35.0-39.9 without comorbidity) Continue diet changes, exercise, and  IF Begin phentermine every AM Maintain adequate hydration Follow up in one month - phentermine 37.5 MG capsule; Take 1 capsule (37.5 mg total) by mouth every morning.  Dispense: 30 capsule; Refill: 0   Partially dictated using Editor, commissioning. Any errors are unintentional.  Halina Maidens,  MD [facility]  08/05/2020

## 2020-08-06 DIAGNOSIS — E669 Obesity, unspecified: Secondary | ICD-10-CM | POA: Insufficient documentation

## 2020-08-11 ENCOUNTER — Other Ambulatory Visit: Payer: Self-pay | Admitting: Internal Medicine

## 2020-08-11 ENCOUNTER — Encounter: Payer: Self-pay | Admitting: Internal Medicine

## 2020-08-11 DIAGNOSIS — E669 Obesity, unspecified: Secondary | ICD-10-CM

## 2020-08-11 MED ORDER — PHENTERMINE HCL 15 MG PO CAPS: 15.0000 mg | ORAL_CAPSULE | ORAL | 0 refills | Status: DC

## 2020-08-11 NOTE — Telephone Encounter (Signed)
Please review.  KP

## 2020-10-03 ENCOUNTER — Other Ambulatory Visit: Payer: Self-pay

## 2020-10-03 DIAGNOSIS — G35 Multiple sclerosis: Secondary | ICD-10-CM

## 2020-10-09 ENCOUNTER — Other Ambulatory Visit: Payer: Self-pay

## 2020-10-09 ENCOUNTER — Inpatient Hospital Stay: Payer: POS | Attending: Oncology

## 2020-10-09 ENCOUNTER — Encounter: Payer: Self-pay | Admitting: Oncology

## 2020-10-09 ENCOUNTER — Ambulatory Visit: Payer: POS

## 2020-10-09 ENCOUNTER — Inpatient Hospital Stay (HOSPITAL_BASED_OUTPATIENT_CLINIC_OR_DEPARTMENT_OTHER): Payer: POS | Admitting: Oncology

## 2020-10-09 VITALS — BP 107/62 | HR 90 | Temp 96.9°F | Resp 18 | Wt 224.4 lb

## 2020-10-09 VITALS — BP 110/73 | HR 94 | Resp 18

## 2020-10-09 DIAGNOSIS — Z87440 Personal history of urinary (tract) infections: Secondary | ICD-10-CM | POA: Insufficient documentation

## 2020-10-09 DIAGNOSIS — Z885 Allergy status to narcotic agent status: Secondary | ICD-10-CM | POA: Insufficient documentation

## 2020-10-09 DIAGNOSIS — G35 Multiple sclerosis: Secondary | ICD-10-CM

## 2020-10-09 DIAGNOSIS — F17210 Nicotine dependence, cigarettes, uncomplicated: Secondary | ICD-10-CM | POA: Insufficient documentation

## 2020-10-09 DIAGNOSIS — Z5112 Encounter for antineoplastic immunotherapy: Secondary | ICD-10-CM | POA: Insufficient documentation

## 2020-10-09 DIAGNOSIS — G378 Other specified demyelinating diseases of central nervous system: Secondary | ICD-10-CM

## 2020-10-09 DIAGNOSIS — Z79899 Other long term (current) drug therapy: Secondary | ICD-10-CM | POA: Diagnosis not present

## 2020-10-09 LAB — CBC WITH DIFFERENTIAL/PLATELET
Abs Immature Granulocytes: 0.03 10*3/uL (ref 0.00–0.07)
Basophils Absolute: 0.1 10*3/uL (ref 0.0–0.1)
Basophils Relative: 1 %
Eosinophils Absolute: 0.1 10*3/uL (ref 0.0–0.5)
Eosinophils Relative: 2 %
HCT: 37 % (ref 36.0–46.0)
Hemoglobin: 12.3 g/dL (ref 12.0–15.0)
Immature Granulocytes: 1 %
Lymphocytes Relative: 29 %
Lymphs Abs: 1.7 10*3/uL (ref 0.7–4.0)
MCH: 29.1 pg (ref 26.0–34.0)
MCHC: 33.2 g/dL (ref 30.0–36.0)
MCV: 87.7 fL (ref 80.0–100.0)
Monocytes Absolute: 0.4 10*3/uL (ref 0.1–1.0)
Monocytes Relative: 6 %
Neutro Abs: 3.6 10*3/uL (ref 1.7–7.7)
Neutrophils Relative %: 61 %
Platelets: 295 10*3/uL (ref 150–400)
RBC: 4.22 MIL/uL (ref 3.87–5.11)
RDW: 13.3 % (ref 11.5–15.5)
WBC: 5.9 10*3/uL (ref 4.0–10.5)
nRBC: 0 % (ref 0.0–0.2)

## 2020-10-09 LAB — COMPREHENSIVE METABOLIC PANEL
ALT: 14 U/L (ref 0–44)
AST: 17 U/L (ref 15–41)
Albumin: 3.9 g/dL (ref 3.5–5.0)
Alkaline Phosphatase: 45 U/L (ref 38–126)
Anion gap: 6 (ref 5–15)
BUN: 14 mg/dL (ref 6–20)
CO2: 22 mmol/L (ref 22–32)
Calcium: 8.7 mg/dL — ABNORMAL LOW (ref 8.9–10.3)
Chloride: 106 mmol/L (ref 98–111)
Creatinine, Ser: 0.79 mg/dL (ref 0.44–1.00)
GFR, Estimated: >60 mL/min (ref >60)
Glucose, Bld: 128 mg/dL — ABNORMAL HIGH (ref 70–99)
Potassium: 3.9 mmol/L (ref 3.5–5.1)
Sodium: 134 mmol/L — ABNORMAL LOW (ref 135–145)
Total Bilirubin: 0.4 mg/dL (ref 0.3–1.2)
Total Protein: 7.6 g/dL (ref 6.5–8.1)

## 2020-10-09 LAB — HEPATITIS PANEL, ACUTE
HCV Ab: NONREACTIVE
Hep A IgM: NONREACTIVE
Hep B C IgM: NONREACTIVE
Hepatitis B Surface Ag: NONREACTIVE

## 2020-10-09 MED ORDER — SODIUM CHLORIDE 0.9 % IV SOLN
1000.0000 mg | Freq: Once | INTRAVENOUS | Status: AC
  Administered 2020-10-09: 1000 mg via INTRAVENOUS
  Filled 2020-10-09: qty 100

## 2020-10-09 MED ORDER — SODIUM CHLORIDE 0.9 % IV SOLN
Freq: Once | INTRAVENOUS | Status: AC
  Filled 2020-10-09: qty 250

## 2020-10-09 MED ORDER — ACETAMINOPHEN 500 MG PO TABS
1000.0000 mg | ORAL_TABLET | Freq: Once | ORAL | Status: AC
  Administered 2020-10-09: 1000 mg via ORAL
  Filled 2020-10-09: qty 2

## 2020-10-09 MED ORDER — DIPHENHYDRAMINE HCL 25 MG PO CAPS
50.0000 mg | ORAL_CAPSULE | Freq: Once | ORAL | Status: AC
  Administered 2020-10-09: 50 mg via ORAL
  Filled 2020-10-09: qty 2

## 2020-10-09 MED ORDER — SODIUM CHLORIDE 0.9 % IV SOLN: 100.0000 mg | Freq: Once | INTRAVENOUS | Status: DC

## 2020-10-09 MED ORDER — METHYLPREDNISOLONE SODIUM SUCC 125 MG IJ SOLR
100.0000 mg | Freq: Once | INTRAMUSCULAR | Status: AC
  Administered 2020-10-09: 100 mg via INTRAVENOUS
  Filled 2020-10-09: qty 2

## 2020-10-09 NOTE — Progress Notes (Signed)
Hematology Oncology Progress Note   Clinic Day:  10/09/2020  Referring physician: Reubin Milan, MD  Chief Complaint: Carly Martin is a 35 y.o. female with multiple sclerosis presents for evaluation prior to  Rituxan.   HPI Patient previously followed up by Dr.Corcoran, patient switched care to me on 10/09/20 Extensive medical record review was performed by me  Patient has history of multiple sclerosis and she follows up with neurologist Dr. Malvin Johns who recommends rituximab treatments.  She also has a Research scientist (life sciences) she sees once a year. Patient has had rituximab treatments in the past and tolerates well.  She gets Solu-Medrol 100 mg prior to the rituximab.  She was recently seen by neurology on 09/24/2020.  Patient has experienced generalized pain every day, some blurriness with vision when she has increased stress levels.  Dr. Malvin Johns recommends patient to proceed with a dose of rituximab 1000 mg.  Last rituximab was in January 2022.  On average, she gets rituximab every 8 months.  She gets frequent UTIs. Last hepatitis panel was done in 2019.      Past Medical History:  Diagnosis Date   Asthma     Past Surgical History:  Procedure Laterality Date   CERVICAL BIOPSY  W/ LOOP ELECTRODE EXCISION  2008   TONSILLECTOMY      Family History  Problem Relation Age of Onset   Migraines Sister    Cancer Maternal Grandfather    Cancer Paternal Grandfather     Social History:  reports that she quit smoking about 3 months ago. Her smoking use included cigarettes. She has a 2.50 pack-year smoking history. She has never used smokeless tobacco. She reports current alcohol use. She reports that she does not use drugs. She smokes 5-6 cigarettes/day.  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  She lives in [place]. The patient is alone today.   Allergies:  Allergies  Allergen Reactions   Codeine Shortness Of  Breath    Current Medications: Current Outpatient Medications  Medication Sig Dispense Refill   albuterol (VENTOLIN HFA) 108 (90 Base) MCG/ACT inhaler Inhale 2 puffs into the lungs every 6 (six) hours as needed for wheezing or shortness of breath. 18 g 1   famotidine (PEPCID) 40 MG tablet Take 1 tablet (40 mg total) by mouth daily. 90 tablet 0   hyoscyamine (LEVSIN SL) 0.125 MG SL tablet Place 1 tablet (0.125 mg total) under the tongue every 4 (four) hours as needed. 30 tablet 3   Multiple Minerals (CALCIUM/MAGNESIUM/ZINC PO) Take 1 tablet by mouth daily.     Multiple Vitamin (MULTI-VITAMIN) tablet Take 1 tablet by mouth daily.      phentermine 37.5 MG capsule Take 1 capsule (37.5 mg total) by mouth every morning. 30 capsule 0   riTUXimab in sodium chloride 0.9 % 250 mL Inject 1,000 mg into the vein every 6 (six) months.     Vitamin D, Ergocalciferol, (DRISDOL) 1.25 MG (50000 UNIT) CAPS capsule Take 1 capsule by mouth once a week.     Ascorbic Acid (VITAMIN C) 1000 MG tablet Take 1,000 mg by mouth daily. (Patient not taking: No sig reported)     clotrimazole-betamethasone (LOTRISONE) cream Apply 1 application topically daily. 45 g 1   etonogestrel (NEXPLANON) 68 MG IMPL implant 1 each by Subdermal route once.     fluticasone (FLONASE) 50 MCG/ACT nasal spray Place 2 sprays into both nostrils daily. (Patient taking differently:  Place 2 sprays into both nostrils as needed.) 16 g 0   phentermine 15 MG capsule Take 1 capsule (15 mg total) by mouth every morning. (Patient not taking: Reported on 10/09/2020) 30 capsule 0   No current facility-administered medications for this visit.   Facility-Administered Medications Ordered in Other Visits  Medication Dose Route Frequency Provider Last Rate Last Admin   riTUXimab (RITUXAN) 1,000 mg in sodium chloride 0.9 % 250 mL (2.8571 mg/mL) infusion  1,000 mg Intravenous Once Rickard Patience, MD        Review of Systems  Constitutional:  Negative for chills,  diaphoresis, fever, malaise/fatigue and weight loss.  HENT: Negative.  Negative for congestion, ear discharge, ear pain, hearing loss, nosebleeds, sinus pain, sore throat and tinnitus.   Eyes:  Negative for blurred vision, double vision, photophobia and pain.  Respiratory:  Negative for cough, hemoptysis, sputum production and shortness of breath.        Exercise induced asthma.  Cardiovascular: Negative.  Negative for chest pain, palpitations, orthopnea and leg swelling.  Gastrointestinal: Negative.  Negative for abdominal pain, blood in stool, constipation, diarrhea, heartburn, melena, nausea and vomiting.  Genitourinary: Negative.  Negative for dysuria, frequency, hematuria and urgency.  Musculoskeletal: Negative.  Negative for back pain, falls, joint pain, myalgias and neck pain.  Skin: Negative.  Negative for itching and rash.  Neurological:  Positive for tingling (in feet, comes and goes) and focal weakness (strength in legs comes and goes). Negative for dizziness, tremors, sensory change, speech change, seizures, weakness and headaches.       Multiple Sclerosis. Patient notes "delayed responses". Coordination issues. No "MS hugs" since last visit.  Endo/Heme/Allergies: Negative.   Psychiatric/Behavioral: Negative.  Negative for depression and memory loss. The patient is not nervous/anxious and does not have insomnia.   All other systems reviewed and are negative. Performance status (ECOG): 1   Vitals Blood pressure 107/62, pulse 90, temperature (!) 96.9 F (36.1 C), resp. rate 18, weight 224 lb 6.9 oz (101.8 kg), SpO2 100 %.  Physical Exam Vitals and nursing note reviewed.  Constitutional:      General: She is not in acute distress.    Appearance: She is well-developed. She is not diaphoretic.  HENT:     Head: Normocephalic and atraumatic.     Mouth/Throat:     Pharynx: No oropharyngeal exudate.  Eyes:     General: No scleral icterus.    Conjunctiva/sclera: Conjunctivae normal.      Pupils: Pupils are equal, round, and reactive to light.  Neck:     Vascular: No JVD.  Cardiovascular:     Rate and Rhythm: Normal rate and regular rhythm.     Heart sounds: Normal heart sounds. No murmur heard. Pulmonary:     Effort: Pulmonary effort is normal. No respiratory distress.     Breath sounds: Normal breath sounds. No wheezing or rales.  Chest:     Chest wall: No tenderness.  Abdominal:     General: Bowel sounds are normal.     Palpations: Abdomen is soft. There is no mass.     Tenderness: There is no abdominal tenderness. There is no guarding or rebound.  Musculoskeletal:        General: No tenderness. Normal range of motion.     Cervical back: Normal range of motion and neck supple.  Lymphadenopathy:     Cervical: No cervical adenopathy.     Upper Body:     Right upper body: No supraclavicular adenopathy.  Left upper body: No supraclavicular adenopathy.  Skin:    General: Skin is warm and dry.  Neurological:     Mental Status: She is alert and oriented to person, place, and time.     Comments: Cranial nerves II-XII are normal. Upper extremity strength is normal and symmetric. Right proximal leg weaker than left. Sensation is intact in upper and lower extremities. Patellar reflexes are normal.  Psychiatric:        Mood and Affect: Mood normal.   Appointment on 10/09/2020  Component Date Value Ref Range Status   Sodium 10/09/2020 134 (A) 135 - 145 mmol/L Final   Potassium 10/09/2020 3.9  3.5 - 5.1 mmol/L Final   Chloride 10/09/2020 106  98 - 111 mmol/L Final   CO2 10/09/2020 22  22 - 32 mmol/L Final   Glucose, Bld 10/09/2020 128 (A) 70 - 99 mg/dL Final   Glucose reference range applies only to samples taken after fasting for at least 8 hours.   BUN 10/09/2020 14  6 - 20 mg/dL Final   Creatinine, Ser 10/09/2020 0.79  0.44 - 1.00 mg/dL Final   Calcium 74/25/9563 8.7 (A) 8.9 - 10.3 mg/dL Final   Total Protein 87/56/4332 7.6  6.5 - 8.1 g/dL Final   Albumin  95/18/8416 3.9  3.5 - 5.0 g/dL Final   AST 60/63/0160 17  15 - 41 U/L Final   ALT 10/09/2020 14  0 - 44 U/L Final   Alkaline Phosphatase 10/09/2020 45  38 - 126 U/L Final   Total Bilirubin 10/09/2020 0.4  0.3 - 1.2 mg/dL Final   GFR, Estimated 10/09/2020 >60  >60 mL/min Final   Comment: (NOTE) Calculated using the CKD-EPI Creatinine Equation (2021)    Anion gap 10/09/2020 6  5 - 15 Final   Performed at [facility], [address], [place], [place] [address]   WBC 10/09/2020 5.9  4.0 - 10.5 K/uL Final   RBC 10/09/2020 4.22  3.87 - 5.11 MIL/uL Final   Hemoglobin 10/09/2020 12.3  12.0 - 15.0 g/dL Final   HCT 35/57/3220 37.0  36.0 - 46.0 % Final   MCV 10/09/2020 87.7  80.0 - 100.0 fL Final   MCH 10/09/2020 29.1  26.0 - 34.0 pg Final   MCHC 10/09/2020 33.2  30.0 - 36.0 g/dL Final   RDW 25/42/7062 13.3  11.5 - 15.5 % Final   Platelets 10/09/2020 295  150 - 400 K/uL Final   nRBC 10/09/2020 0.0  0.0 - 0.2 % Final   Neutrophils Relative % 10/09/2020 61  % Final   Neutro Abs 10/09/2020 3.6  1.7 - 7.7 K/uL Final   Lymphocytes Relative 10/09/2020 29  % Final   Lymphs Abs 10/09/2020 1.7  0.7 - 4.0 K/uL Final   Monocytes Relative 10/09/2020 6  % Final   Monocytes Absolute 10/09/2020 0.4  0.1 - 1.0 K/uL Final   Eosinophils Relative 10/09/2020 2  % Final   Eosinophils Absolute 10/09/2020 0.1  0.0 - 0.5 K/uL Final   Basophils Relative 10/09/2020 1  % Final   Basophils Absolute 10/09/2020 0.1  0.0 - 0.1 K/uL Final   Immature Granulocytes 10/09/2020 1  % Final   Abs Immature Granulocytes 10/09/2020 0.03  0.00 - 0.07 K/uL Final   Performed at [facility], [address], [place], [place] [address]    Assessment:  Carly Martin is a 35 y.o. female with multiple sclerosis.  She was diagnosed on 07/04/2017.   She received  Rituxan on 09/28/2017, 10/12/2017, 03/29/2018, 10/05/2018, and 06/04/2019, 02/11/2020  1. Multiple sclerosis (HCC)    Clinically she is doing well.  She tolerates previous Rituximab treatments.  Recently seen by neurology Dr.Potter who recommend her to proceed with another Rituximab 1000mg   She received Solumedrol 100mg  prior to Rituximab Labs are reviewed and discussed with patient. I check hepatitis panel, which is still pending at the time of dictation.  Patient will call our office when she needs the next Rituximab treatment recommended by neurology  We spent sufficient time to discuss many aspect of care, questions were answered to patient's satisfaction. A total of 25 minutes was spent on this visit.  With 10 minutes spent reviewing image findings, pathology reports, 10 minutes counseling the patient on the treatment plan.  Additional 5 minutes was spent on answering patient's questions.  , MD, PhD Hematology Oncology [facility] 10/09/2020

## 2020-10-09 NOTE — Patient Instructions (Signed)
[facility]  Discharge Instructions: Thank you for choosing [facility] to provide your oncology and hematology care.  If you have a lab appointment with the Cancer Center, please go directly to the Cancer Center and check in at the registration area.  Wear comfortable clothing and clothing appropriate for easy access to any Portacath or PICC line.   We strive to give you quality time with your provider. You may need to reschedule your appointment if you arrive late (15 or more minutes).  Arriving late affects you and other patients whose appointments are after yours.  Also, if you miss three or more appointments without notifying the office, you may be dismissed from the clinic at the provider's discretion.      For prescription refill requests, have your pharmacy contact our office and allow 72 hours for refills to be completed.    Today you received the following chemotherapy and/or immunotherapy agents       To help prevent nausea and vomiting after your treatment, we encourage you to take your nausea medication as directed.  BELOW ARE SYMPTOMS THAT SHOULD BE REPORTED IMMEDIATELY: *FEVER GREATER THAN 100.4 F (38 C) OR HIGHER *CHILLS OR SWEATING *NAUSEA AND VOMITING THAT IS NOT CONTROLLED WITH YOUR NAUSEA MEDICATION *UNUSUAL SHORTNESS OF BREATH *UNUSUAL BRUISING OR BLEEDING *URINARY PROBLEMS (pain or burning when urinating, or frequent urination) *BOWEL PROBLEMS (unusual diarrhea, constipation, pain near the anus) TENDERNESS IN MOUTH AND THROAT WITH OR WITHOUT PRESENCE OF ULCERS (sore throat, sores in mouth, or a toothache) UNUSUAL RASH, SWELLING OR PAIN  UNUSUAL VAGINAL DISCHARGE OR ITCHING   Items with * indicate a potential emergency and should be followed up as soon as possible or go to the Emergency Department if any problems should occur.  Please show the CHEMOTHERAPY ALERT CARD or IMMUNOTHERAPY ALERT CARD at check-in to the Emergency  Department and triage nurse.  Should you have questions after your visit or need to cancel or reschedule your appointment, please contact [facility]  [phone] and follow the prompts.  Office hours are 8:00 a.m. to 4:30 p.m. Monday - Friday. Please note that voicemails left after 4:00 p.m. may not be returned until the following business day.  We are closed weekends and major holidays. You have access to a nurse at all times for urgent questions. Please call the main number to the clinic [phone] and follow the prompts.  For any non-urgent questions, you may also contact your provider using MyChart. We now offer e-Visits for anyone 18 and older to request care online for non-urgent symptoms. For details visit [email or web address].   Also download the MyChart app! Go to the app store, search "MyChart", open the app, select [facility], and log in with your MyChart username and password.  Due to Covid, a mask is required upon entering the hospital/clinic. If you do not have a mask, one will be given to you upon arrival. For doctor visits, patients may have 1 support person aged 18 or older with them. For treatment visits, patients cannot have anyone with them due to current Covid guidelines and our immunocompromised population.  

## 2020-11-30 ENCOUNTER — Ambulatory Visit: Payer: POS

## 2020-12-01 ENCOUNTER — Ambulatory Visit: Payer: POS | Admitting: Internal Medicine

## 2020-12-01 ENCOUNTER — Other Ambulatory Visit: Payer: Self-pay

## 2020-12-01 ENCOUNTER — Encounter: Payer: Self-pay | Admitting: Internal Medicine

## 2020-12-01 VITALS — BP 118/78 | HR 73 | Temp 98.3°F | Ht 65.0 in | Wt 230.2 lb

## 2020-12-01 DIAGNOSIS — R1011 Right upper quadrant pain: Secondary | ICD-10-CM | POA: Diagnosis not present

## 2020-12-01 MED ORDER — OMEPRAZOLE 40 MG PO CPDR: 40.0000 mg | DELAYED_RELEASE_CAPSULE | Freq: Every day | ORAL | 3 refills | Status: DC

## 2020-12-01 NOTE — Progress Notes (Signed)
Date:  12/01/2020   Name:  Carly Martin   DOB:  [birth year]   MRN:  [id number]   Chief Complaint: Abdominal Pain  Abdominal Pain This is a new problem. The current episode started in the past 7 days. The onset quality is sudden. The problem occurs daily. The problem has been unchanged. The pain is located in the epigastric region and RUQ. The pain is at a severity of 3/10. The pain is moderate. The quality of the pain is dull and cramping. The abdominal pain does not radiate. Associated symptoms include nausea. Pertinent negatives include no constipation, diarrhea, fever or vomiting. The pain is aggravated by eating. The pain is relieved by Eating. She has tried antacids for the symptoms. The treatment provided no relief.   Lab Results  Component Value Date   CREATININE 0.79 10/09/2020   BUN 14 10/09/2020   NA 134 (L) 10/09/2020   K 3.9 10/09/2020   CL 106 10/09/2020   CO2 22 10/09/2020   Lab Results  Component Value Date   CHOL 176 07/22/2020   HDL 36 (L) 07/22/2020   LDLCALC 127 (H) 07/22/2020   TRIG 71 07/22/2020   CHOLHDL 4.9 (H) 07/22/2020   Lab Results  Component Value Date   TSH 1.390 07/22/2020   Lab Results  Component Value Date   HGBA1C 5.7 (H) 07/22/2020   Lab Results  Component Value Date   WBC 5.9 10/09/2020   HGB 12.3 10/09/2020   HCT 37.0 10/09/2020   MCV 87.7 10/09/2020   PLT 295 10/09/2020   Lab Results  Component Value Date   ALT 14 10/09/2020   AST 17 10/09/2020   ALKPHOS 45 10/09/2020   BILITOT 0.4 10/09/2020     Review of Systems  Constitutional:  Negative for chills, fatigue and fever.  HENT:  Negative for trouble swallowing.   Respiratory:  Negative for chest tightness and shortness of breath.   Gastrointestinal:  Positive for abdominal pain and nausea. Negative for blood in stool, constipation, diarrhea and vomiting.  Psychiatric/Behavioral:  Positive for sleep disturbance (pain awakens her from sleep). Negative for dysphoric  mood. The patient is not nervous/anxious.    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   Right upper quadrant abdominal pain 09/28/2016   Irritable bowel syndrome with constipation 09/28/2016   Lightheadedness 03/03/2016   Numbness and tingling 09/03/2015   Chronic tension-type headache, not intractable 09/03/2015   Sciatica of right side 03/17/2015   Iron deficiency anemia 03/17/2015   History of loop electrical excision procedure (LEEP) 09/28/2013    Allergies  Allergen Reactions   Codeine Shortness Of Breath    Past Surgical History:  Procedure Laterality Date   CERVICAL BIOPSY  W/ LOOP ELECTRODE EXCISION  2008   TONSILLECTOMY      Social History   Tobacco Use   Smoking status: Former    Packs/day: 0.25    Years: 10.00    Pack years: 2.50    Types: Cigarettes    Quit date: 06/16/2020    Years since quitting: 0.4   Smokeless tobacco: Never  Vaping Use   Vaping Use: Never used  Substance Use Topics   Alcohol use: Yes    Alcohol/week: 0.0 standard drinks  Comment: socially   Drug use: No     Medication list has been reviewed and updated.  Current Meds  Medication Sig   albuterol (VENTOLIN HFA) 108 (90 Base) MCG/ACT inhaler Inhale 2 puffs into the lungs every 6 (six) hours as needed for wheezing or shortness of breath.   clotrimazole-betamethasone (LOTRISONE) cream Apply 1 application topically daily.   etonogestrel (NEXPLANON) 68 MG IMPL implant 1 each by Subdermal route once.   fluticasone (FLONASE) 50 MCG/ACT nasal spray Place 2 sprays into both nostrils daily. (Patient taking differently: Place 2 sprays into both nostrils as needed.)   hyoscyamine (LEVSIN SL) 0.125 MG SL tablet Place 1 tablet (0.125  mg total) under the tongue every 4 (four) hours as needed.   Multiple Minerals (CALCIUM/MAGNESIUM/ZINC PO) Take 1 tablet by mouth daily.   Multiple Vitamin (MULTI-VITAMIN) tablet Take 1 tablet by mouth daily.    omeprazole (PRILOSEC) 40 MG capsule Take 1 capsule (40 mg total) by mouth daily.   riTUXimab in sodium chloride 0.9 % 250 mL Inject 1,000 mg into the vein every 6 (six) months.   Vitamin D, Ergocalciferol, (DRISDOL) 1.25 MG (50000 UNIT) CAPS capsule Take 1 capsule by mouth once a week.   [DISCONTINUED] phentermine 37.5 MG capsule Take 1 capsule (37.5 mg total) by mouth every morning.    PHQ 2/9 Scores 08/05/2020 07/21/2020 06/13/2020 05/19/2020  PHQ - 2 Score 0 0 0 0  PHQ- 9 Score 0 2 3 0    GAD 7 : Generalized Anxiety Score 08/05/2020 07/21/2020 06/13/2020 05/19/2020  Nervous, Anxious, on Edge 0 0 1 0  Control/stop worrying 0 0 0 0  Worry too much - different things 0 0 1 0  Trouble relaxing 0 0 0 0  Restless 0 0 0 0  Easily annoyed or irritable 0 0 1 0  Afraid - awful might happen 0 0 0 0  Total GAD 7 Score 0 0 3 0  Anxiety Difficulty - Not difficult at all Not difficult at all -    BP Readings from Last 3 Encounters:  12/01/20 118/78  10/09/20 110/73  10/09/20 107/62    Physical Exam Vitals and nursing note reviewed.  Constitutional:      General: She is not in acute distress.    Appearance: She is well-developed.  HENT:     Head: Normocephalic and atraumatic.  Pulmonary:     Effort: Pulmonary effort is normal. No respiratory distress.  Abdominal:     General: Abdomen is protuberant. Bowel sounds are decreased.     Palpations: Abdomen is soft.     Tenderness: There is abdominal tenderness in the epigastric area. There is guarding. There is no right CVA tenderness or left CVA tenderness. Positive signs include Murphy's sign.  Skin:    General: Skin is warm and dry.     Findings: No rash.  Neurological:     Mental Status: She is alert and oriented to person, place, and  time.  Psychiatric:        Mood and Affect: Mood normal.        Behavior: Behavior normal.    Wt Readings from Last 3 Encounters:  12/01/20 230 lb 3.2 oz (104.4 kg)  10/09/20 224 lb 6.9 oz (101.8 kg)  08/05/20 221 lb (100.2 kg)    BP 118/78   Pulse 73   Temp 98.3 F (36.8 C) (Oral)   Ht 5\' 5"  (1.651 m)   Wt 230 lb 3.2 oz (104.4 kg)   SpO2  99%   BMI 38.31 kg/m   Assessment and Plan: 1. RUQ abdominal pain Concern for gall bladder disease - PUD less likely Limit fats/protein in diet Begin PPI, rule out H Pylori Urgent US - CBC with Differential/Platelet - Hepatic function panel - omeprazole (PRILOSEC) 40 MG capsule; Take 1 capsule (40 mg total) by mouth daily.  Dispense: 30 capsule; Refill: 3 - US Abdomen Limited RUQ (LIVER/GB) - H. pylori breath test   Partially dictated using Animal nutritionist. Any errors are unintentional.  Bari Edward, MD [facility]  12/01/2020

## 2020-12-02 ENCOUNTER — Encounter: Payer: Self-pay | Admitting: Internal Medicine

## 2020-12-02 ENCOUNTER — Ambulatory Visit
Admission: RE | Admit: 2020-12-02 | Discharge: 2020-12-02 | Disposition: A | Discharge status: Home / Self Care | Payer: POS | Source: Ambulatory Visit | Attending: Internal Medicine | Admitting: Internal Medicine

## 2020-12-02 DIAGNOSIS — R1011 Right upper quadrant pain: Secondary | ICD-10-CM | POA: Diagnosis present

## 2020-12-02 LAB — CBC WITH DIFFERENTIAL/PLATELET
Basophils Absolute: 0.1 10*3/uL (ref 0.0–0.2)
Basos: 1 %
EOS (ABSOLUTE): 0.2 10*3/uL (ref 0.0–0.4)
Eos: 3 %
Hematocrit: 38.1 % (ref 34.0–46.6)
Hemoglobin: 12.3 g/dL (ref 11.1–15.9)
Immature Grans (Abs): 0 10*3/uL (ref 0.0–0.1)
Immature Granulocytes: 0 %
Lymphocytes Absolute: 1.9 10*3/uL (ref 0.7–3.1)
Lymphs: 28 %
MCH: 28.5 pg (ref 26.6–33.0)
MCHC: 32.3 g/dL (ref 31.5–35.7)
MCV: 88 fL (ref 79–97)
Monocytes Absolute: 0.7 10*3/uL (ref 0.1–0.9)
Monocytes: 11 %
Neutrophils Absolute: 3.8 10*3/uL (ref 1.4–7.0)
Neutrophils: 57 %
Platelets: 266 10*3/uL (ref 150–450)
RBC: 4.31 x10E6/uL (ref 3.77–5.28)
RDW: 12.6 % (ref 11.7–15.4)
WBC: 6.6 10*3/uL (ref 3.4–10.8)

## 2020-12-02 LAB — HEPATIC FUNCTION PANEL
ALT: 16 IU/L (ref 0–32)
AST: 16 IU/L (ref 0–40)
Albumin: 4.5 g/dL (ref 3.8–4.8)
Alkaline Phosphatase: 56 IU/L (ref 44–121)
Bilirubin Total: 0.4 mg/dL (ref 0.0–1.2)
Bilirubin, Direct: 0.11 mg/dL (ref 0.00–0.40)
Total Protein: 6.9 g/dL (ref 6.0–8.5)

## 2020-12-02 LAB — H. PYLORI BREATH TEST: H pylori Breath Test: NEGATIVE

## 2020-12-10 ENCOUNTER — Other Ambulatory Visit: Payer: Self-pay

## 2020-12-10 ENCOUNTER — Ambulatory Visit (INDEPENDENT_AMBULATORY_CARE_PROVIDER_SITE_OTHER): Payer: POS

## 2020-12-10 DIAGNOSIS — Z23 Encounter for immunization: Secondary | ICD-10-CM | POA: Diagnosis not present

## 2021-01-22 ENCOUNTER — Other Ambulatory Visit: Payer: Self-pay

## 2021-01-22 ENCOUNTER — Encounter: Payer: Self-pay | Admitting: Internal Medicine

## 2021-01-22 ENCOUNTER — Telehealth: Payer: Self-pay

## 2021-01-22 ENCOUNTER — Ambulatory Visit: Payer: POS | Admitting: Internal Medicine

## 2021-01-22 VITALS — BP 124/80 | HR 64 | Ht 65.0 in | Wt 233.0 lb

## 2021-01-22 DIAGNOSIS — R7303 Prediabetes: Secondary | ICD-10-CM | POA: Diagnosis not present

## 2021-01-22 DIAGNOSIS — G378 Other specified demyelinating diseases of central nervous system: Secondary | ICD-10-CM | POA: Diagnosis not present

## 2021-01-22 DIAGNOSIS — E669 Obesity, unspecified: Secondary | ICD-10-CM

## 2021-01-22 MED ORDER — PHENTERMINE HCL 37.5 MG PO CAPS: 37.5000 mg | ORAL_CAPSULE | ORAL | 1 refills | Status: DC

## 2021-01-22 NOTE — Progress Notes (Signed)
Date:  01/22/2021   Name:  Carly Martin   DOB:  [birth year]   MRN:  [id number]   Chief Complaint: Prediabetes  Diabetes She presents for her follow-up diabetic visit. Diabetes type: prediabetes. Pertinent negatives for hypoglycemia include no dizziness, headaches or nervousness/anxiousness. Associated symptoms include weakness (intermittent foot drop). Pertinent negatives for diabetes include no chest pain and no fatigue. Risk factors for coronary artery disease include obesity and stress.   Obesity - Did well with phentermine 37.5 mg but then developed anxiety. Dose decreased to 15 mg with no sx.  Ran out of meds and resumed the 37.5 with no sx. Now out of medication.  Lab Results  Component Value Date   NA 134 (L) 10/09/2020   K 3.9 10/09/2020   CO2 22 10/09/2020   GLUCOSE 128 (H) 10/09/2020   BUN 14 10/09/2020   CREATININE 0.79 10/09/2020   CALCIUM 8.7 (L) 10/09/2020   EGFR 101 07/22/2020   GFRNONAA >60 10/09/2020   Lab Results  Component Value Date   CHOL 176 07/22/2020   HDL 36 (L) 07/22/2020   LDLCALC 127 (H) 07/22/2020   TRIG 71 07/22/2020   CHOLHDL 4.9 (H) 07/22/2020   Lab Results  Component Value Date   TSH 1.390 07/22/2020   Lab Results  Component Value Date   HGBA1C 5.7 (H) 07/22/2020   Lab Results  Component Value Date   WBC 6.6 12/01/2020   HGB 12.3 12/01/2020   HCT 38.1 12/01/2020   MCV 88 12/01/2020   PLT 266 12/01/2020   Lab Results  Component Value Date   ALT 16 12/01/2020   AST 16 12/01/2020   ALKPHOS 56 12/01/2020   BILITOT 0.4 12/01/2020   No results found for: 25OHVITD2, 25OHVITD3, VD25OH   Review of Systems  Constitutional:  Positive for unexpected weight change. Negative for chills, diaphoresis and fatigue.  Respiratory:  Negative for cough, chest tightness and shortness of breath.   Cardiovascular:  Negative for chest pain, palpitations and leg swelling.  Neurological:  Positive for weakness (intermittent foot drop).  Negative for dizziness, light-headedness and headaches.  Psychiatric/Behavioral:  Negative for dysphoric mood and sleep disturbance. The patient is not nervous/anxious.    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[place]) 07/03/2017   Right upper quadrant abdominal pain 09/28/2016   Irritable bowel syndrome with constipation 09/28/2016   Lightheadedness 03/03/2016   Numbness and tingling 09/03/2015   Chronic tension-type headache, not intractable 09/03/2015   Sciatica of right side 03/17/2015   Iron deficiency anemia 03/17/2015   History of loop electrical excision procedure (LEEP) 09/28/2013    Allergies  Allergen Reactions   Codeine Shortness Of Breath    Past Surgical History:  Procedure Laterality Date   CERVICAL BIOPSY  W/ LOOP ELECTRODE EXCISION  2008   TONSILLECTOMY      Social History   Tobacco Use   Smoking status: Former    Packs/day: 0.25    Years: 10.00    Pack years: 2.50    Types: Cigarettes    Quit date: 06/16/2020    Years since quitting: 0.6   Smokeless tobacco: Never  Vaping Use   Vaping Use: Never used  Substance Use Topics   Alcohol use: Yes    Alcohol/week:  0.0 standard drinks    Comment: socially   Drug use: No     Medication list has been reviewed and updated.  Current Meds  Medication Sig   albuterol (VENTOLIN HFA) 108 (90 Base) MCG/ACT inhaler Inhale 2 puffs into the lungs every 6 (six) hours as needed for wheezing or shortness of breath.   clotrimazole-betamethasone (LOTRISONE) cream Apply 1 application topically daily.   etonogestrel (NEXPLANON) 68 MG IMPL implant 1 each by Subdermal route once.   fluticasone (FLONASE) 50 MCG/ACT nasal spray Place 2 sprays into both nostrils daily. (Patient taking  differently: Place 2 sprays into both nostrils as needed.)   hyoscyamine (LEVSIN SL) 0.125 MG SL tablet Place 1 tablet (0.125 mg total) under the tongue every 4 (four) hours as needed.   Multiple Minerals (CALCIUM/MAGNESIUM/ZINC PO) Take 1 tablet by mouth daily.   Multiple Vitamin (MULTI-VITAMIN) tablet Take 1 tablet by mouth daily.    omeprazole (PRILOSEC) 40 MG capsule Take 1 capsule (40 mg total) by mouth daily.   riTUXimab in sodium chloride 0.9 % 250 mL Inject 1,000 mg into the vein every 6 (six) months.   Vitamin D, Ergocalciferol, (DRISDOL) 1.25 MG (50000 UNIT) CAPS capsule Take 1 capsule by mouth once a week.    PHQ 2/9 Scores 01/22/2021 08/05/2020 07/21/2020 06/13/2020  PHQ - 2 Score 0 0 0 0  PHQ- 9 Score 0 0 2 3    GAD 7 : Generalized Anxiety Score 01/22/2021 08/05/2020 07/21/2020 06/13/2020  Nervous, Anxious, on Edge 0 0 0 1  Control/stop worrying 0 0 0 0  Worry too much - different things 0 0 0 1  Trouble relaxing 0 0 0 0  Restless 0 0 0 0  Easily annoyed or irritable 0 0 0 1  Afraid - awful might happen 0 0 0 0  Total GAD 7 Score 0 0 0 3  Anxiety Difficulty Not difficult at all - Not difficult at all Not difficult at all    BP Readings from Last 3 Encounters:  01/22/21 124/80  12/01/20 118/78  10/09/20 110/73    Physical Exam Vitals and nursing note reviewed.  Constitutional:      General: She is not in acute distress.    Appearance: She is well-developed.  HENT:     Head: Normocephalic and atraumatic.  Pulmonary:     Effort: Pulmonary effort is normal. No respiratory distress.  Skin:    General: Skin is warm and dry.     Findings: No rash.  Neurological:     Mental Status: She is alert and oriented to person, place, and time.  Psychiatric:        Mood and Affect: Mood normal.        Behavior: Behavior normal.    Wt Readings from Last 3 Encounters:  01/22/21 233 lb (105.7 kg)  12/01/20 230 lb 3.2 oz (104.4 kg)  10/09/20 224 lb 6.9 oz (101.8 kg)    BP  124/80    Pulse 64    Ht 5' 5"  (1.651 m)    Wt 233 lb (105.7 kg)    BMI 38.77 kg/m   Assessment and Plan: 1. Prediabetes Unfortunately she has gained some weight recently. Will check labs and advise if medication is needed. - Hemoglobin A1c  2. Obesity, Class II, BMI 35-39.9 Will resume phentermine. Stressed the importance of following a healthy diet and regular exercise. - phentermine 37.5 MG capsule; Take 1 capsule (37.5 mg total) by mouth every morning.  Dispense: 30  capsule; Refill: 1  3. Obesity (BMI 35.0-39.9 without comorbidity) As above.  4. Myelin oligodendrocyte glycoprotein antibody disorder (MOGAD) ([facility]) Intermittent symptoms. Continue Neurology and targeted treatment of MS.   Partially dictated using Editor, commissioning. Any errors are unintentional.  Halina Maidens, MD [facility]  01/22/2021

## 2021-01-22 NOTE — Telephone Encounter (Signed)
PA completed waiting on insurance approval.  Key: Carly Martin

## 2021-01-23 LAB — HEMOGLOBIN A1C
Est. average glucose Bld gHb Est-mCnc: 117 mg/dL
Hgb A1c MFr Bld: 5.7 % — ABNORMAL HIGH (ref 4.8–5.6)

## 2021-01-27 NOTE — Telephone Encounter (Signed)
Coverage Start Date:12/23/2020;Coverage End Date:04/22/2021  Approved

## 2021-02-19 ENCOUNTER — Encounter: Payer: Self-pay | Admitting: Internal Medicine

## 2021-02-19 ENCOUNTER — Ambulatory Visit: Payer: POS | Admitting: Internal Medicine

## 2021-02-19 ENCOUNTER — Other Ambulatory Visit: Payer: Self-pay

## 2021-02-19 VITALS — BP 108/78 | HR 84 | Ht 65.0 in | Wt 233.0 lb

## 2021-02-19 DIAGNOSIS — R3915 Urgency of urination: Secondary | ICD-10-CM

## 2021-02-19 DIAGNOSIS — N3289 Other specified disorders of bladder: Secondary | ICD-10-CM

## 2021-02-19 LAB — POCT URINALYSIS DIPSTICK
Bilirubin, UA: NEGATIVE
Blood, UA: NEGATIVE
Glucose, UA: NEGATIVE
Ketones, UA: NEGATIVE
Leukocytes, UA: NEGATIVE
Nitrite, UA: NEGATIVE
Protein, UA: POSITIVE — AB
Spec Grav, UA: >=1.03 — AB (ref 1.010–1.025)
Urobilinogen, UA: 0.2 E.U./dL
pH, UA: 6 (ref 5.0–8.0)

## 2021-02-19 MED ORDER — NITROFURANTOIN MONOHYD MACRO 100 MG PO CAPS: 100.0000 mg | ORAL_CAPSULE | Freq: Two times a day (BID) | ORAL | 0 refills | Status: AC

## 2021-02-19 NOTE — Progress Notes (Signed)
Date:  02/19/2021   Name:  Carly Martin   DOB:  Apr 15, 1985   MRN:  [id number]   Chief Complaint: Urinary Tract Infection  Urinary Tract Infection  This is a new problem. The current episode started in the past 7 days. The problem has been waxing and waning. The quality of the pain is described as aching. The pain is moderate. There has been no fever. Associated symptoms include hesitancy and urgency (feels like there is more urine in her bladder after voiding but there is not). Pertinent negatives include no chills, frequency or hematuria.   Lab Results  Component Value Date   NA 134 (L) 10/09/2020   K 3.9 10/09/2020   CO2 22 10/09/2020   GLUCOSE 128 (H) 10/09/2020   BUN 14 10/09/2020   CREATININE 0.79 10/09/2020   CALCIUM 8.7 (L) 10/09/2020   EGFR 101 07/22/2020   GFRNONAA >60 10/09/2020   Lab Results  Component Value Date   CHOL 176 07/22/2020   HDL 36 (L) 07/22/2020   LDLCALC 127 (H) 07/22/2020   TRIG 71 07/22/2020   CHOLHDL 4.9 (H) 07/22/2020   Lab Results  Component Value Date   TSH 1.390 07/22/2020   Lab Results  Component Value Date   HGBA1C 5.7 (H) 01/22/2021   Lab Results  Component Value Date   WBC 6.6 12/01/2020   HGB 12.3 12/01/2020   HCT 38.1 12/01/2020   MCV 88 12/01/2020   PLT 266 12/01/2020   Lab Results  Component Value Date   ALT 16 12/01/2020   AST 16 12/01/2020   ALKPHOS 56 12/01/2020   BILITOT 0.4 12/01/2020   No results found for: 25OHVITD2, 25OHVITD3, VD25OH   Review of Systems  Constitutional:  Negative for chills, fatigue and fever.  Respiratory:  Negative for chest tightness and shortness of breath.   Cardiovascular:  Negative for chest pain.  Genitourinary:  Positive for hesitancy and urgency (feels like there is more urine in her bladder after voiding but there is not). Negative for dysuria, frequency, hematuria and vaginal pain.  Neurological:  Negative for dizziness and headaches.   Patient Active Problem List    Diagnosis Date Noted   Obesity, Class II, BMI 35-39.9 08/06/2020   Mild hyperlipidemia 07/23/2020   Anxiety state 07/21/2020   Vitamin D deficiency 07/21/2020   Foot drop, right 08/15/2019   Myelin oligodendrocyte glycoprotein antibody disorder (MOGAD) ([place]) 07/03/2017   Right upper quadrant abdominal pain 09/28/2016   Irritable bowel syndrome with constipation 09/28/2016   Lightheadedness 03/03/2016   Numbness and tingling 09/03/2015   Chronic tension-type headache, not intractable 09/03/2015   Sciatica of right side 03/17/2015   Iron deficiency anemia 03/17/2015   History of loop electrical excision procedure (LEEP) 09/28/2013    Allergies  Allergen Reactions   Codeine Shortness Of Breath    Past Surgical History:  Procedure Laterality Date   CERVICAL BIOPSY  W/ LOOP ELECTRODE EXCISION  2008   TONSILLECTOMY      Social History   Tobacco Use   Smoking status: Former    Packs/day: 0.25    Years: 10.00    Pack years: 2.50    Types: Cigarettes    Quit date: 06/16/2020    Years since quitting: 0.6   Smokeless tobacco: Never  Vaping Use   Vaping Use: Never used  Substance Use Topics   Alcohol use: Yes    Alcohol/week: 0.0 standard drinks    Comment: socially   Drug use: No  Medication list has been reviewed and updated.  Current Meds  Medication Sig   albuterol (VENTOLIN HFA) 108 (90 Base) MCG/ACT inhaler Inhale 2 puffs into the lungs every 6 (six) hours as needed for wheezing or shortness of breath.   clotrimazole-betamethasone (LOTRISONE) cream Apply 1 application topically daily.   etonogestrel (NEXPLANON) 68 MG IMPL implant 1 each by Subdermal route once.   fluticasone (FLONASE) 50 MCG/ACT nasal spray Place 2 sprays into both nostrils daily. (Patient taking differently: Place 2 sprays into both nostrils as needed.)   hyoscyamine (LEVSIN SL) 0.125 MG SL tablet Place 1 tablet (0.125 mg total) under the tongue every 4 (four) hours as needed.   Multiple  Minerals (CALCIUM/MAGNESIUM/ZINC PO) Take 1 tablet by mouth daily.   Multiple Vitamin (MULTI-VITAMIN) tablet Take 1 tablet by mouth daily.    nitrofurantoin, macrocrystal-monohydrate, (MACROBID) 100 MG capsule Take 1 capsule (100 mg total) by mouth 2 (two) times daily for 7 days.   omeprazole (PRILOSEC) 40 MG capsule Take 1 capsule (40 mg total) by mouth daily.   phentermine 37.5 MG capsule Take 1 capsule (37.5 mg total) by mouth every morning.   riTUXimab in sodium chloride 0.9 % 250 mL Inject 1,000 mg into the vein every 6 (six) months.   Vitamin D, Ergocalciferol, (DRISDOL) 1.25 MG (50000 UNIT) CAPS capsule Take 1 capsule by mouth once a week.    PHQ 2/9 Scores 02/19/2021 01/22/2021 08/05/2020 07/21/2020  PHQ - 2 Score 0 0 0 0  PHQ- 9 Score 0 0 0 2    GAD 7 : Generalized Anxiety Score 02/19/2021 01/22/2021 08/05/2020 07/21/2020  Nervous, Anxious, on Edge 0 0 0 0  Control/stop worrying 0 0 0 0  Worry too much - different things 0 0 0 0  Trouble relaxing 0 0 0 0  Restless 0 0 0 0  Easily annoyed or irritable 0 0 0 0  Afraid - awful might happen 0 0 0 0  Total GAD 7 Score 0 0 0 0  Anxiety Difficulty Not difficult at all Not difficult at all - Not difficult at all    BP Readings from Last 3 Encounters:  02/19/21 108/78  01/22/21 124/80  12/01/20 118/78    Physical Exam Vitals and nursing note reviewed.  Constitutional:      General: She is not in acute distress.    Appearance: She is well-developed.  HENT:     Head: Normocephalic and atraumatic.  Cardiovascular:     Rate and Rhythm: Normal rate and regular rhythm.  Pulmonary:     Effort: Pulmonary effort is normal. No respiratory distress.     Breath sounds: No wheezing or rhonchi.  Skin:    General: Skin is warm and dry.     Findings: No rash.  Neurological:     Mental Status: She is alert and oriented to person, place, and time.  Psychiatric:        Mood and Affect: Mood normal.        Behavior: Behavior normal.    Wt  Readings from Last 3 Encounters:  02/19/21 233 lb (105.7 kg)  01/22/21 233 lb (105.7 kg)  12/01/20 230 lb 3.2 oz (104.4 kg)    BP 108/78    Pulse 84    Ht 5' 5" (1.651 m)    Wt 233 lb (105.7 kg)    SpO2 99%    BMI 38.77 kg/m   Assessment and Plan: 1. Bladder spasms Suspected in the past - given Myrbetriq but  she never took it. Can try Levsin PRN since she is hesitant to try Myrbetriq- also discuss with Neurology next week Refer to Urology if needed  2. Urinary urgency UA is negative but she does have hx of UTIs Will give prescription to have on hand if sx change - nitrofurantoin, macrocrystal-monohydrate, (MACROBID) 100 MG capsule; Take 1 capsule (100 mg total) by mouth 2 (two) times daily for 7 days.  Dispense: 14 capsule; Refill: 0   Partially dictated using Editor, commissioning. Any errors are unintentional.  Halina Maidens, MD [facility]  02/19/2021

## 2021-02-27 DIAGNOSIS — Z975 Presence of (intrauterine) contraceptive device: Secondary | ICD-10-CM | POA: Insufficient documentation

## 2021-03-05 ENCOUNTER — Encounter: Payer: Self-pay | Admitting: Internal Medicine

## 2021-03-24 ENCOUNTER — Encounter: Payer: Self-pay | Admitting: Internal Medicine

## 2021-03-24 ENCOUNTER — Other Ambulatory Visit: Payer: Self-pay

## 2021-03-24 ENCOUNTER — Ambulatory Visit (INDEPENDENT_AMBULATORY_CARE_PROVIDER_SITE_OTHER): Payer: POS | Admitting: Internal Medicine

## 2021-03-24 VITALS — BP 110/82 | HR 97 | Ht 65.0 in | Wt 232.0 lb

## 2021-03-24 DIAGNOSIS — E669 Obesity, unspecified: Secondary | ICD-10-CM

## 2021-03-24 DIAGNOSIS — G44229 Chronic tension-type headache, not intractable: Secondary | ICD-10-CM | POA: Diagnosis not present

## 2021-03-24 DIAGNOSIS — G378 Other specified demyelinating diseases of central nervous system: Secondary | ICD-10-CM | POA: Diagnosis not present

## 2021-03-24 DIAGNOSIS — N3000 Acute cystitis without hematuria: Secondary | ICD-10-CM | POA: Diagnosis not present

## 2021-03-24 LAB — POCT URINALYSIS DIPSTICK
Blood, UA: NEGATIVE
Glucose, UA: NEGATIVE
Ketones, UA: NEGATIVE
Nitrite, UA: POSITIVE
Protein, UA: NEGATIVE
Spec Grav, UA: >=1.03 — AB (ref 1.010–1.025)
Urobilinogen, UA: 0.2 E.U./dL
pH, UA: 5 (ref 5.0–8.0)

## 2021-03-24 NOTE — Progress Notes (Signed)
Date:  03/24/2021   Name:  Carly Martin   DOB:  1985/07/28   MRN:  [id number]   Chief Complaint: Prediabetes, Hypertension, Weight Check (Hasnt taken medication in 2 weeks, headache, jittery ), and Urinary Tract Infection  Urinary Tract Infection  This is a recurrent problem. Episode onset: months. The problem occurs every urination. The problem has been unchanged. The patient is experiencing no pain. There has been no fever. She is Sexually active. There is No history of pyelonephritis. Associated symptoms comments: Odor, cloudy. She has tried nothing for the symptoms.  Obesity follow up - started phentermine 37.5 in December but was unable to take this due to elevated blood pressure.  Had been on 15 mg since July. Initial weight 221, last weight 233 lb.  Lab Results  Component Value Date   NA 134 (L) 10/09/2020   K 3.9 10/09/2020   CO2 22 10/09/2020   GLUCOSE 128 (H) 10/09/2020   BUN 14 10/09/2020   CREATININE 0.79 10/09/2020   CALCIUM 8.7 (L) 10/09/2020   EGFR 101 07/22/2020   GFRNONAA >60 10/09/2020   Lab Results  Component Value Date   CHOL 176 07/22/2020   HDL 36 (L) 07/22/2020   LDLCALC 127 (H) 07/22/2020   TRIG 71 07/22/2020   CHOLHDL 4.9 (H) 07/22/2020   Lab Results  Component Value Date   TSH 1.390 07/22/2020   Lab Results  Component Value Date   HGBA1C 5.7 (H) 01/22/2021   Lab Results  Component Value Date   WBC 6.6 12/01/2020   HGB 12.3 12/01/2020   HCT 38.1 12/01/2020   MCV 88 12/01/2020   PLT 266 12/01/2020   Lab Results  Component Value Date   ALT 16 12/01/2020   AST 16 12/01/2020   ALKPHOS 56 12/01/2020   BILITOT 0.4 12/01/2020   No results found for: 25OHVITD2, 25OHVITD3, VD25OH   Review of Systems  Constitutional:  Negative for fatigue and unexpected weight change.  HENT:  Negative for nosebleeds.   Eyes:  Negative for visual disturbance.  Respiratory:  Negative for cough, chest tightness, shortness of breath and wheezing.    Cardiovascular:  Negative for chest pain, palpitations and leg swelling.  Gastrointestinal:  Negative for abdominal pain, constipation and diarrhea.  Neurological:  Negative for dizziness, weakness, light-headedness and headaches.   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[place]) 07/03/2017   Right upper quadrant abdominal pain 09/28/2016   Irritable bowel syndrome with constipation 09/28/2016   Lightheadedness 03/03/2016   Numbness and tingling 09/03/2015   Chronic tension-type headache, not intractable 09/03/2015   Sciatica of right side 03/17/2015   Iron deficiency anemia 03/17/2015   History of loop electrical excision procedure (LEEP) 09/28/2013    Allergies  Allergen Reactions   Codeine Shortness Of Breath    Past Surgical History:  Procedure Laterality Date   CERVICAL BIOPSY  W/ LOOP ELECTRODE EXCISION  2008   TONSILLECTOMY      Social History   Tobacco Use   Smoking status: Former    Packs/day: 0.25    Years: 10.00    Pack years: 2.50    Types: Cigarettes    Quit date: 06/16/2020    Years since quitting: 0.7   Smokeless tobacco: Never  Vaping  Use   Vaping Use: Never used  Substance Use Topics   Alcohol use: Yes    Alcohol/week: 0.0 standard drinks    Comment: socially   Drug use: No     Medication list has been reviewed and updated.  Current Meds  Medication Sig   albuterol (VENTOLIN HFA) 108 (90 Base) MCG/ACT inhaler Inhale 2 puffs into the lungs every 6 (six) hours as needed for wheezing or shortness of breath.   clotrimazole-betamethasone (LOTRISONE) cream Apply 1 application topically daily.   etonogestrel (NEXPLANON) 68 MG IMPL implant 1 each by Subdermal route once.   fluticasone (FLONASE) 50 MCG/ACT  nasal spray Place 2 sprays into both nostrils daily. (Patient taking differently: Place 2 sprays into both nostrils as needed.)   hyoscyamine (LEVSIN SL) 0.125 MG SL tablet Place 1 tablet (0.125 mg total) under the tongue every 4 (four) hours as needed.   Multiple Minerals (CALCIUM/MAGNESIUM/ZINC PO) Take 1 tablet by mouth daily.   Multiple Vitamin (MULTI-VITAMIN) tablet Take 1 tablet by mouth daily.    omeprazole (PRILOSEC) 40 MG capsule Take 1 capsule (40 mg total) by mouth daily.   riTUXimab in sodium chloride 0.9 % 250 mL Inject 1,000 mg into the vein every 6 (six) months.   Vitamin D, Ergocalciferol, (DRISDOL) 1.25 MG (50000 UNIT) CAPS capsule Take 1 capsule by mouth once a week.    PHQ 2/9 Scores 03/24/2021 02/19/2021 01/22/2021 08/05/2020  PHQ - 2 Score 0 0 0 0  PHQ- 9 Score 2 0 0 0    GAD 7 : Generalized Anxiety Score 03/24/2021 02/19/2021 01/22/2021 08/05/2020  Nervous, Anxious, on Edge 0 0 0 0  Control/stop worrying 0 0 0 0  Worry too much - different things 0 0 0 0  Trouble relaxing 0 0 0 0  Restless 0 0 0 0  Easily annoyed or irritable 0 0 0 0  Afraid - awful might happen 0 0 0 0  Total GAD 7 Score 0 0 0 0  Anxiety Difficulty - Not difficult at all Not difficult at all -    BP Readings from Last 3 Encounters:  03/24/21 110/82  02/19/21 108/78  01/22/21 124/80    Physical Exam Vitals and nursing note reviewed.  Constitutional:      General: She is not in acute distress.    Appearance: Normal appearance. She is well-developed.  HENT:     Head: Normocephalic and atraumatic.  Cardiovascular:     Rate and Rhythm: Normal rate and regular rhythm.     Pulses: Normal pulses.     Heart sounds: No murmur heard. Pulmonary:     Effort: Pulmonary effort is normal. No respiratory distress.     Breath sounds: No wheezing or rhonchi.  Musculoskeletal:     Cervical back: Normal range of motion.     Right lower leg: No edema.     Left lower leg: No edema.  Lymphadenopathy:      Cervical: No cervical adenopathy.  Skin:    General: Skin is warm and dry.     Capillary Refill: Capillary refill takes less than 2 seconds.     Findings: No rash.  Neurological:     Mental Status: She is alert and oriented to person, place, and time.  Psychiatric:        Mood and Affect: Mood normal.        Behavior: Behavior normal.    Wt Readings from Last 3 Encounters:  03/24/21 232 lb (105.2 kg)  02/19/21  233 lb (105.7 kg)  01/22/21 233 lb (105.7 kg)    BP 110/82    Pulse 97    Ht _0  (1.651 m)    Wt 232 lb (105.2 kg)    SpO2 99%    BMI 38.61 kg/m   Assessment and Plan: 1. 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 On Rituximab and followed by Neurology  2. Obesity, Class II, BMI 35-39.9 Did not loose on phentermine 15 mg and had headache and elevated BP on 37.5 mg. Encourage portion control and low carb diet along with regular exercise No additional pharmacologic intervention at this time  3. Acute cystitis without hematuria Only nitrates and concentrated urine on dip. She has odor and cloudy urine without other symptoms. Continue to push fluids without antibiotics at this time. - POCT urinalysis dipstick  4. Chronic tension-type headache, not intractable This has resolved off of higher dose phentermine.   Partially dictated using Editor, commissioning. Any errors are unintentional.  Halina Maidens, MD [facility]  03/24/2021

## 2021-03-30 ENCOUNTER — Encounter: Payer: Self-pay | Admitting: Internal Medicine

## 2021-03-31 ENCOUNTER — Encounter: Payer: Self-pay | Admitting: Internal Medicine

## 2021-03-31 ENCOUNTER — Ambulatory Visit: Payer: POS | Admitting: Internal Medicine

## 2021-03-31 ENCOUNTER — Other Ambulatory Visit: Payer: Self-pay

## 2021-03-31 VITALS — BP 124/80 | HR 72 | Ht 65.0 in | Wt 230.0 lb

## 2021-03-31 DIAGNOSIS — L739 Follicular disorder, unspecified: Secondary | ICD-10-CM

## 2021-03-31 MED ORDER — CEPHALEXIN 500 MG PO CAPS: 500.0000 mg | ORAL_CAPSULE | Freq: Three times a day (TID) | ORAL | 0 refills | Status: AC

## 2021-03-31 NOTE — Progress Notes (Signed)
Date:  03/31/2021   Name:  Carly Martin   DOB:  [birth year]   MRN:  [id number]   Chief Complaint: Recurrent Skin Infections (Both armpits. Patient working out and sweat and friction is irritating them. )  Rash This is a recurrent problem. Location: both axillae. The rash is characterized by swelling and pain. She was exposed to nothing (has been exercising more and creating more friction). Pertinent negatives include no fever. Treatments tried: warm compresses. The treatment provided no relief.   Lab Results  Component Value Date   NA 134 (L) 10/09/2020   K 3.9 10/09/2020   CO2 22 10/09/2020   GLUCOSE 128 (H) 10/09/2020   BUN 14 10/09/2020   CREATININE 0.79 10/09/2020   CALCIUM 8.7 (L) 10/09/2020   EGFR 101 07/22/2020   GFRNONAA >60 10/09/2020   Lab Results  Component Value Date   CHOL 176 07/22/2020   HDL 36 (L) 07/22/2020   LDLCALC 127 (H) 07/22/2020   TRIG 71 07/22/2020   CHOLHDL 4.9 (H) 07/22/2020   Lab Results  Component Value Date   TSH 1.390 07/22/2020   Lab Results  Component Value Date   HGBA1C 5.7 (H) 01/22/2021   Lab Results  Component Value Date   WBC 6.6 12/01/2020   HGB 12.3 12/01/2020   HCT 38.1 12/01/2020   MCV 88 12/01/2020   PLT 266 12/01/2020   Lab Results  Component Value Date   ALT 16 12/01/2020   AST 16 12/01/2020   ALKPHOS 56 12/01/2020   BILITOT 0.4 12/01/2020   No results found for: 25OHVITD2, 25OHVITD3, VD25OH   Review of Systems  Constitutional:  Negative for chills and fever.  Skin:  Positive for rash. Negative for color change.   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[facility]) 07/03/2017   Right upper quadrant abdominal pain 09/28/2016   Irritable bowel syndrome with  constipation 09/28/2016   Lightheadedness 03/03/2016   Numbness and tingling 09/03/2015   Chronic tension-type headache, not intractable 09/03/2015   Sciatica of right side 03/17/2015   Iron deficiency anemia 03/17/2015   History of loop electrical excision procedure (LEEP) 09/28/2013    Allergies  Allergen Reactions   Codeine Shortness Of Breath    Past Surgical History:  Procedure Laterality Date   CERVICAL BIOPSY  W/ LOOP ELECTRODE EXCISION  2008   TONSILLECTOMY      Social History   Tobacco Use   Smoking status: Former    Packs/day: 0.25    Years: 10.00    Pack years: 2.50    Types: Cigarettes    Quit date: 06/16/2020    Years since quitting: 0.7   Smokeless tobacco: Never  Vaping Use   Vaping Use: Never used  Substance Use Topics   Alcohol use: Yes    Alcohol/week: 0.0 standard drinks    Comment: socially   Drug use: No     Medication list has been reviewed and updated.  Current Meds  Medication Sig   albuterol (VENTOLIN HFA) 108 (90 Base) MCG/ACT inhaler Inhale 2 puffs into the lungs every 6 (six) hours as needed for wheezing or shortness of breath.   clotrimazole-betamethasone (LOTRISONE) cream Apply 1 application topically daily.   etonogestrel (NEXPLANON) 68 MG IMPL implant 1 each  by Subdermal route once.   fluticasone (FLONASE) 50 MCG/ACT nasal spray Place 2 sprays into both nostrils daily. (Patient taking differently: Place 2 sprays into both nostrils as needed.)   hyoscyamine (LEVSIN SL) 0.125 MG SL tablet Place 1 tablet (0.125 mg total) under the tongue every 4 (four) hours as needed.   Multiple Minerals (CALCIUM/MAGNESIUM/ZINC PO) Take 1 tablet by mouth daily.   Multiple Vitamin (MULTI-VITAMIN) tablet Take 1 tablet by mouth daily.    omeprazole (PRILOSEC) 40 MG capsule Take 1 capsule (40 mg total) by mouth daily.   riTUXimab in sodium chloride 0.9 % 250 mL Inject 1,000 mg into the vein every 6 (six) months.   Vitamin D, Ergocalciferol, (DRISDOL) 1.25  MG (50000 UNIT) CAPS capsule Take 1 capsule by mouth once a week.    PHQ 2/9 Scores 03/31/2021 03/24/2021 02/19/2021 01/22/2021  PHQ - 2 Score 0 0 0 0  PHQ- 9 Score 1 2 0 0    GAD 7 : Generalized Anxiety Score 03/31/2021 03/24/2021 02/19/2021 01/22/2021  Nervous, Anxious, on Edge 0 0 0 0  Control/stop worrying 0 0 0 0  Worry too much - different things 0 0 0 0  Trouble relaxing 0 0 0 0  Restless 0 0 0 0  Easily annoyed or irritable 0 0 0 0  Afraid - awful might happen 0 0 0 0  Total GAD 7 Score 0 0 0 0  Anxiety Difficulty - - Not difficult at all Not difficult at all    BP Readings from Last 3 Encounters:  03/31/21 124/80  03/24/21 110/82  02/19/21 108/78    Physical Exam Vitals and nursing note reviewed.  Constitutional:      General: She is not in acute distress.    Appearance: She is well-developed.  HENT:     Head: Normocephalic and atraumatic.  Pulmonary:     Effort: Pulmonary effort is normal. No respiratory distress.  Skin:    General: Skin is warm and dry.     Findings: Lesion and rash present. Rash is nodular (subcutaneous firm lesions in both axillae).  Neurological:     Mental Status: She is alert and oriented to person, place, and time.  Psychiatric:        Mood and Affect: Mood normal.        Behavior: Behavior normal.    Wt Readings from Last 3 Encounters:  03/31/21 230 lb (104.3 kg)  03/24/21 232 lb (105.2 kg)  02/19/21 233 lb (105.7 kg)    BP 124/80    Pulse 72    Ht 5' 5"  (1.651 m)    Wt 230 lb (104.3 kg)    SpO2 99%    BMI 38.27 kg/m   Assessment and Plan: 1. Folliculitis of both axillae Continue warm compresses if helpful Avoid antiperspirants If worsening, may need to see Dermatology - cephALEXin (KEFLEX) 500 MG capsule; Take 1 capsule (500 mg total) by mouth 3 (three) times daily for 10 days.  Dispense: 30 capsule; Refill: 0   Partially dictated using Editor, commissioning. Any errors are unintentional.  Halina Maidens, MD [facility]  03/31/2021

## 2021-04-06 ENCOUNTER — Ambulatory Visit: Payer: POS | Admitting: Internal Medicine

## 2021-07-21 ENCOUNTER — Encounter: Payer: Self-pay | Admitting: Internal Medicine

## 2021-07-22 ENCOUNTER — Ambulatory Visit: Payer: Self-pay | Admitting: Internal Medicine

## 2021-07-22 DIAGNOSIS — K219 Gastro-esophageal reflux disease without esophagitis: Secondary | ICD-10-CM | POA: Insufficient documentation

## 2021-07-22 NOTE — Progress Notes (Deleted)
Date:  07/22/2021   Name:  Carly Martin   DOB:  03-01-1985   MRN:  [id number]   Chief Complaint: No chief complaint on file. Carly Martin is a 36 y.o. female who presents today for her Complete Annual Exam. She feels {DESC; WELL/FAIRLY WELL/POORLY:18703}. She reports exercising ***. She reports she is sleeping {DESC; WELL/FAIRLY WELL/POORLY:18703}. Breast complaints ***.  Mammogram: none DEXA: none Pap smear: 01/2020 [facility] and [facility] Colonoscopy: none  Health Maintenance Due  Topic Date Due   COVID-19 Vaccine (4 - Booster for [place] series) 03/23/2020    Immunization History  Administered Date(s) Administered   Hepatitis B, adult 09/01/2017   Influenza,inj,Quad PF,6+ Mos 09/13/2018, 01/16/2020, 12/10/2020   Influenza-Unspecified 01/06/2015, 01/16/2020   PFIZER(Purple Top)SARS-COV-2 Vaccination 03/02/2019, 03/23/2019, 01/27/2020   Tdap 03/18/2014    Gastroesophageal Reflux She complains of heartburn. She reports no abdominal pain, no chest pain, no coughing or no wheezing. This is a recurrent problem. The problem occurs rarely. Pertinent negatives include no fatigue. She has tried a PPI for the symptoms. Past procedures include an abdominal ultrasound and H. pylori antibody titer.    Lab Results  Component Value Date   NA 134 (L) 10/09/2020   K 3.9 10/09/2020   CO2 22 10/09/2020   GLUCOSE 128 (H) 10/09/2020   BUN 14 10/09/2020   CREATININE 0.79 10/09/2020   CALCIUM 8.7 (L) 10/09/2020   EGFR 101 07/22/2020   GFRNONAA >60 10/09/2020   Lab Results  Component Value Date   CHOL 176 07/22/2020   HDL 36 (L) 07/22/2020   LDLCALC 127 (H) 07/22/2020   TRIG 71 07/22/2020   CHOLHDL 4.9 (H) 07/22/2020   Lab Results  Component Value Date   TSH 1.390 07/22/2020   Lab Results  Component Value Date   HGBA1C 5.7 (H) 01/22/2021   Lab Results  Component Value Date   WBC 6.6 12/01/2020   HGB 12.3 12/01/2020   HCT 38.1 12/01/2020   MCV 88 12/01/2020   PLT  266 12/01/2020   Lab Results  Component Value Date   ALT 16 12/01/2020   AST 16 12/01/2020   ALKPHOS 56 12/01/2020   BILITOT 0.4 12/01/2020   No results found for: "25OHVITD2", "25OHVITD3", "VD25OH"   Review of Systems  Constitutional:  Negative for chills, fatigue and fever.  HENT:  Negative for congestion, hearing loss, tinnitus, trouble swallowing and voice change.   Eyes:  Negative for visual disturbance.  Respiratory:  Negative for cough, chest tightness, shortness of breath and wheezing.   Cardiovascular:  Negative for chest pain, palpitations and leg swelling.  Gastrointestinal:  Positive for heartburn. Negative for abdominal pain, constipation, diarrhea and vomiting.  Endocrine: Negative for polydipsia and polyuria.  Genitourinary:  Negative for dysuria, frequency, genital sores, vaginal bleeding and vaginal discharge.  Musculoskeletal:  Negative for arthralgias, gait problem and joint swelling.  Skin:  Negative for color change and rash.  Neurological:  Positive for numbness (and tingling) and headaches. Negative for dizziness, tremors and light-headedness.  Hematological:  Negative for adenopathy. Does not bruise/bleed easily.  Psychiatric/Behavioral:  Negative for dysphoric mood and sleep disturbance. The patient is not nervous/anxious.     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[facility]) 07/03/2017   Right upper  quadrant abdominal pain 09/28/2016   Irritable bowel syndrome with constipation 09/28/2016   Lightheadedness 03/03/2016   Numbness and tingling 09/03/2015   Chronic tension-type headache, not intractable 09/03/2015   Sciatica of right side 03/17/2015   Iron deficiency anemia 03/17/2015   History of loop electrical  excision procedure (LEEP) 09/28/2013    Allergies  Allergen Reactions   Codeine Shortness Of Breath    Past Surgical History:  Procedure Laterality Date   CERVICAL BIOPSY  W/ LOOP ELECTRODE EXCISION  2008   TONSILLECTOMY      Social History   Tobacco Use   Smoking status: Former    Packs/day: 0.25    Years: 10.00    Total pack years: 2.50    Types: Cigarettes    Quit date: 06/16/2020    Years since quitting: 1.0   Smokeless tobacco: Never  Vaping Use   Vaping Use: Never used  Substance Use Topics   Alcohol use: Yes    Alcohol/week: 0.0 standard drinks of alcohol    Comment: socially   Drug use: No     Medication list has been reviewed and updated.  No outpatient medications have been marked as taking for the 07/22/21 encounter (Appointment) with Glean Hess, MD.       03/31/2021   11:21 AM 03/24/2021    9:02 AM 02/19/2021    8:35 AM 01/22/2021   10:12 AM  GAD 7 : Generalized Anxiety Score  Nervous, Anxious, on Edge 0 0 0 0  Control/stop worrying 0 0 0 0  Worry too much - different things 0 0 0 0  Trouble relaxing 0 0 0 0  Restless 0 0 0 0  Easily annoyed or irritable 0 0 0 0  Afraid - awful might happen 0 0 0 0  Total GAD 7 Score 0 0 0 0  Anxiety Difficulty   Not difficult at all Not difficult at all       03/31/2021   11:20 AM  Depression screen PHQ 2/9  Decreased Interest 0  Down, Depressed, Hopeless 0  PHQ - 2 Score 0  Altered sleeping 0  Tired, decreased energy 0  Change in appetite 0  Feeling bad or failure about yourself  1  Trouble concentrating 0  Moving slowly or fidgety/restless 0  Suicidal thoughts 0  PHQ-9 Score 1  Difficult doing work/chores Not difficult at all    BP Readings from Last 3 Encounters:  03/31/21 124/80  03/24/21 110/82  02/19/21 108/78    Physical Exam Vitals and nursing note reviewed.  Constitutional:      General: She is not in acute distress.    Appearance: She is well-developed.  HENT:     Head:  Normocephalic and atraumatic.     Right Ear: Tympanic membrane and ear canal normal.     Left Ear: Tympanic membrane and ear canal normal.     Nose:     Right Sinus: No maxillary sinus tenderness.     Left Sinus: No maxillary sinus tenderness.  Eyes:     General: No scleral icterus.       Right eye: No discharge.        Left eye: No discharge.     Conjunctiva/sclera: Conjunctivae normal.  Neck:     Thyroid: No thyromegaly.     Vascular: No carotid bruit.  Cardiovascular:     Rate and Rhythm: Normal rate and regular rhythm.     Pulses: Normal pulses.     Heart  sounds: Normal heart sounds.  Pulmonary:     Effort: Pulmonary effort is normal. No respiratory distress.     Breath sounds: No wheezing.  Chest:  Breasts:    Right: No mass, nipple discharge, skin change or tenderness.     Left: No mass, nipple discharge, skin change or tenderness.  Abdominal:     General: Bowel sounds are normal.     Palpations: Abdomen is soft.     Tenderness: There is no abdominal tenderness.  Musculoskeletal:     Cervical back: Normal range of motion. No erythema.     Right lower leg: No edema.     Left lower leg: No edema.  Lymphadenopathy:     Cervical: No cervical adenopathy.  Skin:    General: Skin is warm and dry.     Findings: No rash.  Neurological:     Mental Status: She is alert and oriented to person, place, and time.     Cranial Nerves: No cranial nerve deficit.     Sensory: No sensory deficit.     Deep Tendon Reflexes: Reflexes are normal and symmetric.  Psychiatric:        Attention and Perception: Attention normal.        Mood and Affect: Mood normal.     Wt Readings from Last 3 Encounters:  03/31/21 230 lb (104.3 kg)  03/24/21 232 lb (105.2 kg)  02/19/21 233 lb (105.7 kg)    There were no vitals taken for this visit.  Assessment and Plan:

## 2021-07-28 ENCOUNTER — Ambulatory Visit
Admission: RE | Admit: 2021-07-28 | Discharge: 2021-07-28 | Disposition: A | Discharge status: Home / Self Care | Payer: POS | Source: Ambulatory Visit | Attending: Neurology | Admitting: Neurology

## 2021-07-28 DIAGNOSIS — G35 Multiple sclerosis: Secondary | ICD-10-CM | POA: Diagnosis present

## 2021-07-28 MED ORDER — ACETAMINOPHEN 500 MG PO TABS
ORAL_TABLET | ORAL | Status: AC
  Administered 2021-07-28: 1000 mg via ORAL
  Filled 2021-07-28: qty 2

## 2021-07-28 MED ORDER — DIPHENHYDRAMINE HCL 25 MG PO CAPS
ORAL_CAPSULE | ORAL | Status: AC
  Administered 2021-07-28: 50 mg via ORAL
  Filled 2021-07-28: qty 2

## 2021-07-28 MED ORDER — SODIUM CHLORIDE 0.9 % IV SOLN
1000.0000 mg | Freq: Once | INTRAVENOUS | Status: AC
  Administered 2021-07-28: 1000 mg via INTRAVENOUS
  Filled 2021-07-28: qty 100

## 2021-07-28 MED ORDER — DIPHENHYDRAMINE HCL 25 MG PO CAPS: 50.0000 mg | ORAL_CAPSULE | ORAL | Status: DC | PRN

## 2021-07-28 MED ORDER — METHYLPREDNISOLONE SODIUM SUCC 125 MG IJ SOLR: 100.0000 mg | Freq: Once | INTRAMUSCULAR | Status: AC

## 2021-07-28 MED ORDER — ACETAMINOPHEN 500 MG PO TABS: 1000.0000 mg | ORAL_TABLET | ORAL | Status: DC | PRN

## 2021-07-28 MED ORDER — METHYLPREDNISOLONE SODIUM SUCC 125 MG IJ SOLR
INTRAMUSCULAR | Status: AC
  Administered 2021-07-28: 100 mg via INTRAVENOUS
  Filled 2021-07-28: qty 2

## 2021-09-08 ENCOUNTER — Ambulatory Visit
Admission: EM | Admit: 2021-09-08 | Discharge: 2021-09-08 | Disposition: A | Discharge status: Home / Self Care | Payer: POS | Attending: Emergency Medicine | Admitting: Emergency Medicine

## 2021-09-08 DIAGNOSIS — R002 Palpitations: Secondary | ICD-10-CM

## 2021-09-08 LAB — COMPREHENSIVE METABOLIC PANEL
ALT: 14 U/L (ref 0–44)
AST: 14 U/L — ABNORMAL LOW (ref 15–41)
Albumin: 4.2 g/dL (ref 3.5–5.0)
Alkaline Phosphatase: 49 U/L (ref 38–126)
Anion gap: 8 (ref 5–15)
BUN: 15 mg/dL (ref 6–20)
CO2: 25 mmol/L (ref 22–32)
Calcium: 9.2 mg/dL (ref 8.9–10.3)
Chloride: 103 mmol/L (ref 98–111)
Creatinine, Ser: 0.76 mg/dL (ref 0.44–1.00)
GFR, Estimated: >60 mL/min (ref >60)
Glucose, Bld: 74 mg/dL (ref 70–99)
Potassium: 3.6 mmol/L (ref 3.5–5.1)
Sodium: 136 mmol/L (ref 135–145)
Total Bilirubin: 1 mg/dL (ref 0.3–1.2)
Total Protein: 7.8 g/dL (ref 6.5–8.1)

## 2021-09-08 LAB — CBC WITH DIFFERENTIAL/PLATELET
Abs Immature Granulocytes: 0.03 10*3/uL (ref 0.00–0.07)
Basophils Absolute: 0.1 10*3/uL (ref 0.0–0.1)
Basophils Relative: 1 %
Eosinophils Absolute: 0.1 10*3/uL (ref 0.0–0.5)
Eosinophils Relative: 1 %
HCT: 38.7 % (ref 36.0–46.0)
Hemoglobin: 12.3 g/dL (ref 12.0–15.0)
Immature Granulocytes: 0 %
Lymphocytes Relative: 20 %
Lymphs Abs: 2.1 10*3/uL (ref 0.7–4.0)
MCH: 28.5 pg (ref 26.0–34.0)
MCHC: 31.8 g/dL (ref 30.0–36.0)
MCV: 89.6 fL (ref 80.0–100.0)
Monocytes Absolute: 0.7 10*3/uL (ref 0.1–1.0)
Monocytes Relative: 7 %
Neutro Abs: 7.2 10*3/uL (ref 1.7–7.7)
Neutrophils Relative %: 71 %
Platelets: 284 10*3/uL (ref 150–400)
RBC: 4.32 MIL/uL (ref 3.87–5.11)
RDW: 13.5 % (ref 11.5–15.5)
WBC: 10.2 10*3/uL (ref 4.0–10.5)
nRBC: 0 % (ref 0.0–0.2)

## 2021-09-08 LAB — URINALYSIS, ROUTINE W REFLEX MICROSCOPIC
Bilirubin Urine: NEGATIVE
Glucose, UA: NEGATIVE mg/dL
Hgb urine dipstick: NEGATIVE
Ketones, ur: 40 mg/dL — AB
Leukocytes,Ua: NEGATIVE
Nitrite: NEGATIVE
Protein, ur: NEGATIVE mg/dL
Specific Gravity, Urine: 1.02 (ref 1.005–1.030)
pH: 5 (ref 5.0–8.0)

## 2021-09-08 NOTE — ED Provider Notes (Signed)
[facility]    CSN: [id number] Arrival date & time: 09/08/21  1157      History   Chief Complaint Chief Complaint  Patient presents with   Palpitations    HPI Carly Martin is a 36 y.o. female.   HPI  36 year old female here for evaluation of palpitations patient reports that for the last month or more she has been experiencing periodic episodes where she will have heart racing that tends to last 10 to 15 seconds and is associated with some shortness of breath and tightness in her chest.  Also with feeling of being clammy.  These will spontaneously resolve and she has not been able to link any attributing factor to their onset.  She has been taking Provigil for MS but has stopped that since she has had these elevated heart rate episodes.  This is not associated with any dizziness, sweating, nausea, chest pain, or syncope.  She does have an appointment to see her PCP in September.  She does see neurology and neurology is concerned that she has an issue with her heart.  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      Home Medications    Prior to Admission  medications   Medication Sig Start Date End Date Taking? Authorizing Provider  albuterol (VENTOLIN HFA) 108 (90 Base) MCG/ACT inhaler Inhale 2 puffs into the lungs every 6 (six) hours as needed for wheezing or shortness of breath. 07/11/19  Yes Reubin Milan, MD  clotrimazole-betamethasone (LOTRISONE) cream Apply 1 application topically daily. 06/13/20  Yes Reubin Milan, MD  etonogestrel (NEXPLANON) 68 MG IMPL implant 1 each by Subdermal route once.   Yes [provider]  fluticasone (FLONASE) 50 MCG/ACT nasal spray Place 2 sprays into both nostrils daily. Patient taking differently: Place 2 sprays into both nostrils as needed. 10/26/16  Yes Domenick Gong, MD  modafinil (PROVIGIL) 100 MG tablet Take 100 mg by mouth daily. 04/30/21  Yes [provider]  omeprazole (PRILOSEC) 40 MG capsule Take 1 capsule (40 mg total) by mouth daily. 12/01/20  Yes Reubin Milan, MD  riTUXimab in sodium chloride 0.9 % 250 mL Inject 1,000 mg into the vein every 6 (six) months.   Yes [provider]  Vitamin D, Ergocalciferol, (DRISDOL) 1.25 MG (50000 UNIT) CAPS capsule Take 1 capsule by mouth once a week. 03/17/20  Yes [provider]    Family History Family History  Problem Relation Age of Onset   Migraines Sister    Cancer Maternal Grandfather    Cancer Paternal Grandfather     Social History Social History   Tobacco Use  Smoking status: Former    Packs/day: 0.25    Years: 10.00    Total pack years: 2.50    Types: Cigarettes    Quit date: 06/16/2020    Years since quitting: 1.2   Smokeless tobacco: Never  Vaping Use   Vaping Use: Never used  Substance Use Topics   Alcohol use: Yes    Alcohol/week: 0.0 standard drinks of alcohol    Comment: socially   Drug use: No     Allergies   Codeine   Review of Systems Review of Systems  Constitutional:  Positive for diaphoresis.  Respiratory:  Positive for chest tightness and shortness of breath.    Cardiovascular:  Positive for palpitations. Negative for chest pain.  Gastrointestinal:  Negative for nausea.  Skin: Negative.   Neurological:  Negative for dizziness and syncope.  Hematological: Negative.   Psychiatric/Behavioral: Negative.       Physical Exam Triage Vital Signs ED Triage Vitals [09/08/21 1214]  Enc Vitals Group     BP      Pulse      Resp      Temp      Temp src      SpO2      Weight 240 lb (108.9 kg)     Height 5\' 5"  (1.651 m)     Head Circumference      Peak Flow      Pain Score 0     Pain Loc      Pain Edu?      Excl. in GC?    No data found.  Updated Vital Signs BP (S) (!) 152/70 (BP Location: Left Arm)   Pulse 91   Temp 98.3 F (36.8 C) (Oral)   Ht 5\' 5"  (1.651 m)   Wt 240 lb (108.9 kg)   LMP 07/27/2021 (Approximate)   SpO2 100%   BMI 39.94 kg/m   Visual Acuity Right Eye Distance:   Left Eye Distance:   Bilateral Distance:    Right Eye Near:   Left Eye Near:    Bilateral Near:     Physical Exam Vitals and nursing note reviewed.  Constitutional:      Appearance: Normal appearance. She is not ill-appearing.  HENT:     Head: Normocephalic and atraumatic.  Eyes:     Extraocular Movements: Extraocular movements intact.     Pupils: Pupils are equal, round, and reactive to light.     Comments: Conjunctiva are pale.  Cardiovascular:     Rate and Rhythm: Normal rate. Rhythm irregular.     Pulses: Normal pulses.     Heart sounds: Normal heart sounds. No murmur heard.    No friction rub. No gallop.  Pulmonary:     Effort: Pulmonary effort is normal.     Breath sounds: Normal breath sounds. No wheezing, rhonchi or rales.  Skin:    General: Skin is warm and dry.     Capillary Refill: Capillary refill takes 2 to 3 seconds.     Coloration: Skin is not pale.  Neurological:     General: No focal deficit present.     Mental Status: She is alert and oriented to person, place, and time.  Psychiatric:        Mood and Affect: Mood  normal.        Behavior: Behavior normal.        Thought Content: Thought content normal.        Judgment: Judgment normal.  UC Treatments / Results  Labs (all labs ordered are listed, but only abnormal results are displayed) Labs Reviewed  COMPREHENSIVE METABOLIC PANEL - Abnormal; Notable for the following components:      Result Value   AST 14 (*)    All other components within normal limits  URINALYSIS, ROUTINE W REFLEX MICROSCOPIC - Abnormal; Notable for the following components:   Ketones, ur 40 (*)    All other components within normal limits  CBC WITH DIFFERENTIAL/PLATELET    EKG Sinus rhythm with frequent PVCs and a ventricular rate of 80 bpm PR interval 170 ms QRS duration 82 ms QT/QTc 368/424 ms No ST or T wave abnormalities noted.  No other tracings available in epic for comparison.   Radiology No results found.  Procedures Procedures (including critical care time)  Medications Ordered in UC Medications - No data to display  Initial Impression / Assessment and Plan / UC Course  I have reviewed the triage vital signs and the nursing notes.  Pertinent labs & imaging results that were available during my care of the patient were reviewed by me and considered in my medical decision making (see chart for details).  Patient is a pleasant, nontoxic-appearing 36 year old female here for evaluation of palpitations that has been going on for the past month.  As described in HPI above, the patient will have episodes where her heart rate will spike to the 120s.  This will last 10 to 15 seconds and then spontaneously resolved.  These episodes are associated with with the patient terms as "breathlessness", tightness in her chest, and a feeling of being clammy.  All the symptoms resolved when the issues resolved.  The patient has been unable to pinpoint any precipitating factors.  These episodes resolved spontaneously on her own.  There is no associated dizziness or  syncope, chest pain, diaphoresis, or nausea.  Patient does have a history of MS and iron deficiency anemia.  She also has a history of vitamin D deficiency and anxiety.  Her physical exam reveals mildly pale conjunctiva bilaterally.  Cardiopulmonary exam reveals an irregular heartbeat with frequent PVCs.  No murmur, rub, or gallop.  Lung sounds are clear to auscultation in all fields.  Peripheral pulses are 2+.  Her cap refill is approximately 3 seconds.  EKG shows sinus rhythm with frequent PVCs but is otherwise unremarkable.  I will check CBC, CMP, and UA for evidence of anemia or electrolyte imbalance which may be contributing to the patient's PVCs and palpitations.  She reports that she is being followed by OB/GYN at present due to the fact that for the last 6 months her menstrual cycle has been lasting for 30 days followed by being off for 30 days.  She has not had a CBC or iron levels checked in a while.  Urinalysis shows 40 ketones but otherwise no nitrites, leukocyte esterase, or protein.  No glucose.  Specific gravity is normal at 1.020.  CBC does not demonstrate any signs of anemia.  Her H&H is 12.3 and 38.7 respectively.  Platelet count is 284.  WBC count is normal at 10.2.  CMP does not show any electrolyte abnormalities as the sodium is normal at 136 and potassium is normal at 3.6.  Renal function is also normal.  Transaminases are unremarkable.  The source of the patient's PVCs is unclear.  I will refer her to cardiology.  I have instructed her to go to the ER if she develops any chest pain, shortness of breath, sweating,  nausea, or syncope.   Final Clinical Impressions(s) / UC Diagnoses   Final diagnoses:  Palpitations     Discharge Instructions      Your blood work today did not show any signs of anemia or electrolyte imbalances that might be triggering your PVCs.  I am going to refer you to cardiology for an evaluation to see if they can determine the source of your  palpitations.  If you develop any chest pain, especially if it is associated with nausea, sweating, dizziness, or fainting you need to call 911 and go to the ER.  Follow-up appointment with Dr. Judithann Graves next month as scheduled.     ED Prescriptions   None    PDMP not reviewed this encounter.   Becky Augusta, NP 09/08/21 1347

## 2021-09-08 NOTE — Discharge Instructions (Signed)
Your blood work today did not show any signs of anemia or electrolyte imbalances that might be triggering your PVCs.  I am going to refer you to cardiology for an evaluation to see if they can determine the source of your palpitations.  If you develop any chest pain, especially if it is associated with nausea, sweating, dizziness, or fainting you need to call 911 and go to the ER.  Follow-up appointment with Dr. Judithann Graves next month as scheduled.

## 2021-09-08 NOTE — ED Triage Notes (Signed)
Patient reports that 1 month ago her heart will start "beating hard". Patient reports per her apple watch her HR jumps from 70s to 120s.   Patient reports that she has seen neurologist and they think she has a something wrong with her heart.   Patient does have a PCP appt but Neuro does not want her waiting that long.

## 2021-10-09 ENCOUNTER — Ambulatory Visit: Payer: POS | Admitting: Internal Medicine

## 2021-10-12 ENCOUNTER — Encounter: Payer: Self-pay | Admitting: Internal Medicine

## 2021-10-12 ENCOUNTER — Ambulatory Visit: Payer: POS | Admitting: Internal Medicine

## 2021-10-12 VITALS — BP 110/80 | HR 84 | Ht 65.0 in | Wt 235.0 lb

## 2021-10-12 DIAGNOSIS — R002 Palpitations: Secondary | ICD-10-CM

## 2021-10-12 NOTE — Progress Notes (Signed)
Date:  10/12/2021   Name:  Carly Martin   DOB:  05/18/1985   MRN:  [id number]   Chief Complaint: Tachycardia (Had Neurology appt last month, and was told to follow up with PCP about elevated heart rate. Seen UC on 08/08- has PVCs. Seeing cardiology, 10/06[facility].)  Palpitations  This is a new problem. The current episode started 1 to 4 weeks ago. The problem occurs daily. The problem has been unchanged. On average, each episode lasts 1 second. Exacerbated by: stopped Nuvigil and phentermine. Associated symptoms include an irregular heartbeat. Pertinent negatives include no chest pain, diaphoresis, dizziness, fever, malaise/fatigue, near-syncope, shortness of breath, syncope or weakness. She has tried nothing for the symptoms.  She was seen in UC - EKG showed PVCs.  CMP and CBC were normal.  She has a cardiology appt next month.  Lab Results  Component Value Date   NA 136 09/08/2021   K 3.6 09/08/2021   CO2 25 09/08/2021   GLUCOSE 74 09/08/2021   BUN 15 09/08/2021   CREATININE 0.76 09/08/2021   CALCIUM 9.2 09/08/2021   EGFR 101 07/22/2020   GFRNONAA >60 09/08/2021   Lab Results  Component Value Date   CHOL 176 07/22/2020   HDL 36 (L) 07/22/2020   LDLCALC 127 (H) 07/22/2020   TRIG 71 07/22/2020   CHOLHDL 4.9 (H) 07/22/2020   Lab Results  Component Value Date   TSH 1.390 07/22/2020   Lab Results  Component Value Date   HGBA1C 5.7 (H) 01/22/2021   Lab Results  Component Value Date   WBC 10.2 09/08/2021   HGB 12.3 09/08/2021   HCT 38.7 09/08/2021   MCV 89.6 09/08/2021   PLT 284 09/08/2021   Lab Results  Component Value Date   ALT 14 09/08/2021   AST 14 (L) 09/08/2021   ALKPHOS 49 09/08/2021   BILITOT 1.0 09/08/2021   No results found for: "25OHVITD2", "25OHVITD3", "VD25OH"   Review of Systems  Constitutional:  Negative for diaphoresis, fever and malaise/fatigue.  Respiratory:  Negative for shortness of breath.   Cardiovascular:  Positive  for palpitations. Negative for chest pain, syncope and near-syncope.  Neurological:  Negative for dizziness and weakness.    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/28/2016   Chronic tension-type headache, not intractable 09/03/2015   Sciatica of right side 03/17/2015   Iron deficiency anemia 03/17/2015   History of loop electrical excision procedure (LEEP) 09/28/2013    Allergies  Allergen Reactions   Codeine Shortness Of Breath    Past Surgical History:  Procedure Laterality Date   CERVICAL BIOPSY  W/ LOOP ELECTRODE EXCISION  2008   TONSILLECTOMY      Social History   Tobacco Use   Smoking status: Former    Packs/day: 0.25    Years: 10.00    Total pack years: 2.50    Types: Cigarettes    Quit date: 06/16/2020    Years since quitting: 1.3   Smokeless tobacco: Never  Vaping Use   Vaping Use: Never used  Substance Use Topics   Alcohol use: Yes    Alcohol/week: 0.0 standard drinks  of alcohol    Comment: socially   Drug use: No     Medication list has been reviewed and updated.  Current Meds  Medication Sig   clotrimazole-betamethasone (LOTRISONE) cream Apply 1 application topically daily.   etonogestrel (NEXPLANON) 68 MG IMPL implant 1 each by Subdermal route once.   riTUXimab in sodium chloride 0.9 % 250 mL Inject 1,000 mg into the vein every 6 (six) months.   Vitamin D, Ergocalciferol, (DRISDOL) 1.25 MG (50000 UNIT) CAPS capsule Take 1 capsule by mouth once a week.       03/31/2021   11:21 AM 03/24/2021    9:02 AM 02/19/2021    8:35 AM 01/22/2021   10:12 AM  GAD 7 : Generalized  Anxiety Score  Nervous, Anxious, on Edge 0 0 0 0  Control/stop worrying 0 0 0 0  Worry too much - different things 0 0 0 0  Trouble relaxing 0 0 0 0  Restless 0 0 0 0  Easily annoyed or irritable 0 0 0 0  Afraid - awful might happen 0 0 0 0  Total GAD 7 Score 0 0 0 0  Anxiety Difficulty   Not difficult at all Not difficult at all       03/31/2021   11:20 AM 03/24/2021    9:02 AM 02/19/2021    8:35 AM  Depression screen PHQ 2/9  Decreased Interest 0 0 0  Down, Depressed, Hopeless 0 0 0  PHQ - 2 Score 0 0 0  Altered sleeping 0 0 0  Tired, decreased energy 0 1 0  Change in appetite 0 0 0  Feeling bad or failure about yourself  1 0 0  Trouble concentrating 0 1 0  Moving slowly or fidgety/restless 0 0 0  Suicidal thoughts 0 0 0  PHQ-9 Score 1 2 0  Difficult doing work/chores Not difficult at all Somewhat difficult Not difficult at all    BP Readings from Last 3 Encounters:  10/12/21 110/80  09/08/21 (S) (!) 152/70  03/31/21 124/80    Physical Exam Vitals and nursing note reviewed.  Constitutional:      General: She is not in acute distress.    Appearance: She is well-developed.  HENT:     Head: Normocephalic and atraumatic.  Cardiovascular:     Rate and Rhythm: Normal rate and regular rhythm. Frequent Extrasystoles are present.    Pulses: Normal pulses.     Heart sounds: Normal heart sounds.  Pulmonary:     Effort: Pulmonary effort is normal. No respiratory distress.     Breath sounds: No wheezing or rhonchi.  Musculoskeletal:     Right lower leg: No edema.     Left lower leg: No edema.  Skin:    General: Skin is warm and dry.     Findings: No rash.  Neurological:     Mental Status: She is alert and oriented to person, place, and time.  Psychiatric:        Mood and Affect: Mood normal.        Behavior: Behavior normal.     Wt Readings from Last 3 Encounters:  10/12/21 235 lb (106.6 kg)  09/08/21 240 lb (108.9 kg)  03/31/21 230 lb (104.3 kg)    BP 110/80    Pulse 84   Ht _0  (1.651 m)   Wt 235 lb (106.6 kg)   SpO2 98%   BMI 39.11 kg/m   Assessment and Plan: 1. Palpitations Agree with staying off  of Provigil and Phentermine Remain hydrated. No treatment for now since minimally symptomatic and BP at baseline is borderline low.   Partially dictated using Editor, commissioning. Any errors are unintentional.  Halina Maidens, MD [facility]  10/12/2021

## 2021-10-16 ENCOUNTER — Encounter: Payer: Self-pay | Admitting: Hematology and Oncology

## 2021-11-06 ENCOUNTER — Encounter: Payer: Self-pay | Admitting: Cardiology

## 2021-11-06 ENCOUNTER — Ambulatory Visit: Payer: POS | Attending: Cardiology | Admitting: Cardiology

## 2021-11-06 ENCOUNTER — Ambulatory Visit (INDEPENDENT_AMBULATORY_CARE_PROVIDER_SITE_OTHER): Payer: POS

## 2021-11-06 VITALS — BP 108/72 | HR 94 | Ht 65.0 in | Wt 232.2 lb

## 2021-11-06 DIAGNOSIS — R002 Palpitations: Secondary | ICD-10-CM

## 2021-11-06 DIAGNOSIS — Z6838 Body mass index (BMI) 38.0-38.9, adult: Secondary | ICD-10-CM | POA: Diagnosis not present

## 2021-11-06 DIAGNOSIS — I493 Ventricular premature depolarization: Secondary | ICD-10-CM

## 2021-11-06 DIAGNOSIS — E7800 Pure hypercholesterolemia, unspecified: Secondary | ICD-10-CM

## 2021-11-06 NOTE — Patient Instructions (Signed)
Medication Instructions:    Your physician recommends that you continue on your current medications as directed. Please refer to the Current Medication list given to you today.  *If you need a refill on your cardiac medications before your next appointment, please call your pharmacy*   Testing/Procedures:  Your physician has requested that you have an echocardiogram. Echocardiography is a painless test that uses sound waves to create images of your heart. It provides your doctor with information about the size and shape of your heart and how well your heart's chambers and valves are working. This procedure takes approximately one hour. There are no restrictions for this procedure.  Your physician has recommended that you wear a Zio XT monitor for 2 weeks. This will be mailed to your home address in 4-5 business days.   Your clinician has requested a Zio heart rhythm monitor by [employer] to be mailed to your home for you to wear for 14 days. You should expect a small box to arrive via USPS (or FedEx in some cases) within this next week. If you do not receive it please call [employer] at [phone].  Closely watching your heart at this time will help your care team understand more and provide information needed to develop your plan of care.  Please apply your Zio patch monitor the day you receive it. Keep this packaging, you will use this to return your Zio monitor.  You will easily be able to apply the monitor with the instructions provided in the Patient Guide.  If you need assistance, [employer] representatives are available 24/7 at [phone].  You can also download the MyZio app on your phone to view detailed application instructions and log symptoms.  After you wear your monitor for 14 days, place it back in the blue box or envelope, along with your Symptom Log.  To send your monitor back: Simply use the pre-addressed and pre-paid box/envelope.  Send it back through USPS mail the  same day you remove it via your local post office or by placing it in your mailbox.  As soon as we receive the results, they will be reviewed and your clinician will contact you.  For the first 24 hours- it is essential to not shower or exercise, to allow the patch to adhere to your skin. Avoid excessive sweating to help maximize wear time. Do not submerge the device, no hot tubs, and no swimming pools. Keep any lotions or oils away from the patch. After 24 hours you may shower with the patch on. Take brief showers with your back facing the shower head.  Do not remove patch once it has been placed because that will interrupt data and decrease adhesive wear time. Push the button when you have any symptoms and write down what you were feeling. Once you have completed wearing your monitor, remove and place into box which has postage paid and place in your outgoing mailbox.  If for some reason you have misplaced your box then call our office and we can provide another box and/or mail it off for you.    Follow-Up: At [facility], you and your health needs are our priority.  As part of our continuing mission to provide you with exceptional heart care, we have created designated Provider Care Teams.  These Care Teams include your primary Cardiologist (physician) and Advanced Practice Providers (APPs -  Physician Assistants and Nurse Practitioners) who all work together to provide you with the care you need, when you need   it.  We recommend signing up for the patient portal called "MyChart".  Sign up information is provided on this After Visit Summary.  MyChart is used to connect with patients for Virtual Visits (Telemedicine).  Patients are able to view lab/test results, encounter notes, upcoming appointments, etc.  Non-urgent messages can be sent to your provider as well.   To learn more about what you can do with MyChart, go to [email or web address].    Your next appointment:   6-8  week(s)  The format for your next appointment:   In Person  Provider:   You may see Brian Agbor-Etang, MD or one of the following Advanced Practice Providers on your designated Care Team:   Christopher Berge, NP Ryan Dunn, PA-C Cadence Furth, PA-C Sheri Hammock, NP    Other Instructions   Important Information About Sugar       

## 2021-11-06 NOTE — Progress Notes (Signed)
Cardiology Office Note:    Date:  11/06/2021   ID:  Carly Martin, DOB 03/01/1985, MRN [id number]  PCP:  Reubin Milan, MD   [facility] Providers Cardiologist:  None     Referring MD: Becky Augusta, NP   Chief Complaint  Patient presents with   New Patient (Initial Visit)    Frequent recurrent Palpitations, SOB, chest tightness, Family Hx   Carly Martin is a 36 y.o. female who is being seen today for the evaluation of palpitations at the request of Becky Augusta, NP.   History of Present Illness:    Carly Martin is a 35 y.o. female with a hx of multiple sclerosis, former smoker x15 years who presents due to palpitations.  States having palpitations beginning 2 months ago.  Was on phentermine x2 months at the time to help with weight loss.  Presented to urgent care, EKG showed PVCs.  Was advised to follow-up with cardiology.  States having a racing heart almost daily, not associated with activity.  Getting anxious, worried or stressed makes palpitations worse.  Denies any personal history of heart disease.  Drinks coffee about twice a week.  She stopped phentermine without improvement in symptoms.  Past Medical History:  Diagnosis Date   Asthma     Past Surgical History:  Procedure Laterality Date   CERVICAL BIOPSY  W/ LOOP ELECTRODE EXCISION  2008   TONSILLECTOMY      Current Medications: Current Meds  Medication Sig   etonogestrel (NEXPLANON) 68 MG IMPL implant 1 each by Subdermal route once.   riTUXimab in sodium chloride 0.9 % 250 mL Inject 1,000 mg into the vein every 6 (six) months.   Vitamin D, Ergocalciferol, (DRISDOL) 1.25 MG (50000 UNIT) CAPS capsule Take 1 capsule by mouth once a week.     Allergies:   Codeine   Social History   Socioeconomic History   Marital status: Married    Spouse name: Not on file   Number of children: Not on file   Years of education: Not on file   Highest education level: Not on file   Occupational History   Not on file  Tobacco Use   Smoking status: Former    Packs/day: 0.25    Years: 10.00    Total pack years: 2.50    Types: Cigarettes    Quit date: 06/16/2020    Years since quitting: 1.3   Smokeless tobacco: Never  Vaping Use   Vaping Use: Never used  Substance and Sexual Activity   Alcohol use: Yes    Alcohol/week: 0.0 standard drinks of alcohol    Comment: socially   Drug use: No   Sexual activity: Yes  Other Topics Concern   Not on file  Social History Narrative   Not on file   Social Determinants of Health   Financial Resource Strain: Low Risk  (07/03/2017)   Overall Financial Resource Strain (CARDIA)    Difficulty of Paying Living Expenses: Not hard at all  Food Insecurity: No Food Insecurity (07/03/2017)   Hunger Vital Sign    Worried About Running Out of Food in the Last Year: Never true    Ran Out of Food in the Last Year: Never true  Transportation Needs: No Transportation Needs (07/03/2017)   PRAPARE - Administrator, Civil Service (Medical): No    Lack of Transportation (Non-Medical): No  Physical Activity: Unknown (07/03/2017)   Exercise Vital Sign    Days of Exercise  per Week: Patient refused    Minutes of Exercise per Session: Patient refused  Stress: No Stress Concern Present (07/03/2017)   [employer] of Occupational Health - Occupational Stress Questionnaire    Feeling of Stress : Only a little  Social Connections: Moderately Integrated (07/03/2017)   Social Connection and Isolation Panel [NHANES]    Frequency of Communication with Friends and Family: Twice a week    Frequency of Social Gatherings with Friends and Family: Three times a week    Attends Religious Services: 1 to 4 times per year    Active Member of Clubs or Organizations: No    Attends Banker Meetings: Never    Marital Status: Married     Family History: The patient's family history includes Cancer in her maternal grandfather and paternal  grandfather; Migraines in her sister.  ROS:   Please see the history of present illness.     All other systems reviewed and are negative.  EKGs/Labs/Other Studies Reviewed:    The following studies were reviewed today:   EKG:  EKG is  ordered today.  The ekg ordered today demonstrates sinus rhythm, frequent PVCs, bigeminy pattern  Recent Labs: 09/08/2021: ALT 14; BUN 15; Creatinine, Ser 0.76; Hemoglobin 12.3; Platelets 284; Potassium 3.6; Sodium 136  Recent Lipid Panel    Component Value Date/Time   CHOL 176 07/22/2020 0824   TRIG 71 07/22/2020 0824   HDL 36 (L) 07/22/2020 0824   CHOLHDL 4.9 (H) 07/22/2020 0824   LDLCALC 127 (H) 07/22/2020 0824     Risk Assessment/Calculations:             Physical Exam:    VS:  BP 108/72 (BP Location: Right Arm, Patient Position: Sitting, Cuff Size: Normal)   Pulse 94   Ht 5\' 5"  (1.651 m)   Wt 232 lb 3.2 oz (105.3 kg)   SpO2 99%   BMI 38.64 kg/m     Wt Readings from Last 3 Encounters:  11/06/21 232 lb 3.2 oz (105.3 kg)  10/12/21 235 lb (106.6 kg)  09/08/21 240 lb (108.9 kg)     GEN:  Well nourished, well developed in no acute distress HEENT: Normal NECK: No JVD; No carotid bruits CARDIAC: RRR, no murmurs, rubs, gallops RESPIRATORY:  Clear to auscultation without rales, wheezing or rhonchi  ABDOMEN: Soft, non-tender, non-distended MUSCULOSKELETAL:  No edema; No deformity  SKIN: Warm and dry NEUROLOGIC:  Alert and oriented x 3 PSYCHIATRIC:  Normal affect   ASSESSMENT:    1. Frequent PVCs   2. Pure hypercholesterolemia   3. BMI 38.0-38.9,adult   4. Palpitations    PLAN:    In order of problems listed above:  Frequent PVCs noted on EKG, bigeminy pattern.  Origin appears to be right ventricle/RVOT origin.  Place cardiac monitor to document PVC burden, get echocardiogram.  Consider suppression with beta-blocker based on testing.  Will Consider EP input especially if EF is low for possible ablation. Hyperlipidemia, not in  statin benefit group, low-cholesterol diet recommended. Obesity, low-calorie diet.  Follow-up after echo and cardiac monitor     Medication Adjustments/Labs and Tests Ordered: Current medicines are reviewed at length with the patient today.  Concerns regarding medicines are outlined above.  Orders Placed This Encounter  Procedures   LONG TERM MONITOR (3-14 DAYS)   EKG 12-Lead   ECHOCARDIOGRAM COMPLETE   No orders of the defined types were placed in this encounter.   Patient Instructions  Medication Instructions:   Your physician recommends  that you continue on your current medications as directed. Please refer to the Current Medication list given to you today.  *If you need a refill on your cardiac medications before your next appointment, please call your pharmacy*   Testing/Procedures:  Your physician has requested that you have an echocardiogram. Echocardiography is a painless test that uses sound waves to create images of your heart. It provides your doctor with information about the size and shape of your heart and how well your heart's chambers and valves are working. This procedure takes approximately one hour. There are no restrictions for this procedure.  Your physician has recommended that you wear a Zio XT monitor for 2 weeks. This will be mailed to your home address in 4-5 business days.   Your clinician has requested a Zio heart rhythm monitor by i[employer] to be mailed to your home for you to wear for 14 days. You should expect a small box to arrive via USPS (or FedEx in some cases) within this next week. If you do not receive it please call [employer] at [phone].  Closely watching your heart at this time will help your care team understand more and provide information needed to develop your plan of care.  Please apply your Zio patch monitor the day you receive it. Keep this packaging, you will use this to return your Zio monitor.  You will easily be able to apply  the monitor with the instructions provided in the Patient Guide.  If you need assistance, [employer] representatives are available 24/7 at [phone].  You can also download the [facility] app on your phone to view detailed application instructions and log symptoms.  After you wear your monitor for 14 days, place it back in the blue box or envelope, along with your Symptom Log.  To send your monitor back: Simply use the pre-addressed and pre-paid box/envelope.  Send it back through [employer] the same day you remove it via your local post office or by placing it in your mailbox.  As soon as we receive the results, they will be reviewed and your clinician will contact you.  For the first 24 hours- it is essential to not shower or exercise, to allow the patch to adhere to your skin. Avoid excessive sweating to help maximize wear time. Do not submerge the device, no hot tubs, and no swimming pools. Keep any lotions or oils away from the patch. After 24 hours you may shower with the patch on. Take brief showers with your back facing the shower head.  Do not remove patch once it has been placed because that will interrupt data and decrease adhesive wear time. Push the button when you have any symptoms and write down what you were feeling. Once you have completed wearing your monitor, remove and place into box which has postage paid and place in your outgoing mailbox.  If for some reason you have misplaced your box then call our office and we can provide another box and/or mail it off for you.    Follow-Up: At [facility], you and your health needs are our priority.  As part of our continuing mission to provide you with exceptional heart care, we have created designated Provider Care Teams.  These Care Teams include your primary Cardiologist (physician) and Advanced Practice Providers (APPs -  Physician Assistants and Nurse Practitioners) who all work together to provide you with the care  you need, when you need it.  We recommend signing up for  the patient portal called "MyChart".  Sign up information is provided on this After Visit Summary.  MyChart is used to connect with patients for Virtual Visits (Telemedicine).  Patients are able to view lab/test results, encounter notes, upcoming appointments, etc.  Non-urgent messages can be sent to your provider as well.   To learn more about what you can do with MyChart, go to [email or web address].    Your next appointment:   6-8 week(s)  The format for your next appointment:   In Person  Provider:   You may see Kate Sable, MD or one of the following Advanced Practice Providers on your designated Care Team:   Murray Hodgkins, NP Christell Faith, PA-C Cadence Kathlen Mody, PA-C Gerrie Nordmann, NP    Other Instructions   Important Information About Sugar         Signed, Kate Sable, MD  11/06/2021 9:40 AM    [place]

## 2021-11-09 DIAGNOSIS — R002 Palpitations: Secondary | ICD-10-CM | POA: Diagnosis not present

## 2021-11-09 DIAGNOSIS — I493 Ventricular premature depolarization: Secondary | ICD-10-CM | POA: Diagnosis not present

## 2021-12-23 ENCOUNTER — Ambulatory Visit: Payer: POS | Attending: Cardiology

## 2021-12-23 DIAGNOSIS — I493 Ventricular premature depolarization: Secondary | ICD-10-CM | POA: Diagnosis not present

## 2021-12-23 LAB — ECHOCARDIOGRAM COMPLETE
AR max vel: 3.14 cm2
AV Area VTI: 3.1 cm2
AV Area mean vel: 3.09 cm2
AV Mean grad: 2 mmHg
AV Peak grad: 4.2 mmHg
Ao pk vel: 1.02 m/s
Area-P 1/2: 3.65 cm2
Calc EF: 46.1 %
S' Lateral: 3.3 cm
Single Plane A2C EF: 45.9 %
Single Plane A4C EF: 46.2 %

## 2021-12-28 ENCOUNTER — Ambulatory Visit: Payer: POS | Attending: Cardiology | Admitting: Cardiology

## 2021-12-28 ENCOUNTER — Encounter: Payer: Self-pay | Admitting: Cardiology

## 2021-12-28 VITALS — BP 118/68 | HR 76 | Ht 65.0 in | Wt 241.8 lb

## 2021-12-28 DIAGNOSIS — E7800 Pure hypercholesterolemia, unspecified: Secondary | ICD-10-CM | POA: Diagnosis not present

## 2021-12-28 DIAGNOSIS — I493 Ventricular premature depolarization: Secondary | ICD-10-CM | POA: Diagnosis not present

## 2021-12-28 NOTE — Patient Instructions (Signed)
Medication Instructions:   Your physician recommends that you continue on your current medications as directed. Please refer to the Current Medication list given to you today.   *If you need a refill on your cardiac medications before your next appointment, please call your pharmacy*   Testing/Procedures:  Your physician has requested that you have an echocardiogram in 1 year. Echocardiography is a painless test that uses sound waves to create images of your heart. It provides your doctor with information about the size and shape of your heart and how well your heart's chambers and valves are working. This procedure takes approximately one hour. There are no restrictions for this procedure. Please do NOT wear cologne, perfume, aftershave, or lotions (deodorant is allowed). Please arrive 15 minutes prior to your appointment time.   Follow-Up: At [facility], you and your health needs are our priority.  As part of our continuing mission to provide you with exceptional heart care, we have created designated Provider Care Teams.  These Care Teams include your primary Cardiologist (physician) and Advanced Practice Providers (APPs -  Physician Assistants and Nurse Practitioners) who all work together to provide you with the care you need, when you need it.  We recommend signing up for the patient portal called "MyChart".  Sign up information is provided on this After Visit Summary.  MyChart is used to connect with patients for Virtual Visits (Telemedicine).  Patients are able to view lab/test results, encounter notes, upcoming appointments, etc.  Non-urgent messages can be sent to your provider as well.   To learn more about what you can do with MyChart, go to [email or web address].    Your next appointment:     In 1 year after Echo  The format for your next appointment:   In Person  Provider:   Debbe Odea, MD    Other Instructions   Important Information About  Sugar

## 2021-12-28 NOTE — Progress Notes (Signed)
Cardiology Office Note:    Date:  12/28/2021   ID:  Carly Martin, DOB 1985-03-04, MRN [id number]  PCP:  Carly Milan, MD   [facility] Providers Cardiologist:  None     Referring MD: Carly Milan, MD   Chief Complaint  Patient presents with   Follow-up    6-8 week testing follow up, no new cardiac concerns     History of Present Illness:    Carly Martin is a 36 y.o. female with a hx of multiple sclerosis, former smoker x15 years, frequent PVCs who presents for follow-up.  Previously seen for palpitations and frequent PVCs.  Cardiac monitor was placed to document PVC burden.  Was previously on phentermine, which was stopped.  Currently takes rituximab for multiple sclerosis management.  Overall she states doing okay, has occasional skipped heartbeats, denies dizziness, presyncope or syncope.  Prior notes Echo 12/2021 EF 55 to 60%   Past Medical History:  Diagnosis Date   Asthma     Past Surgical History:  Procedure Laterality Date   CERVICAL BIOPSY  W/ LOOP ELECTRODE EXCISION  2008   TONSILLECTOMY      Current Medications: Current Meds  Medication Sig   clotrimazole-betamethasone (LOTRISONE) cream Apply 1 application topically daily.   etonogestrel (NEXPLANON) 68 MG IMPL implant 1 each by Subdermal route once.   riTUXimab in sodium chloride 0.9 % 250 mL Inject 1,000 mg into the vein every 6 (six) months.   Vitamin D, Ergocalciferol, (DRISDOL) 1.25 MG (50000 UNIT) CAPS capsule Take 1 capsule by mouth once a week.     Allergies:   Codeine   Social History   Socioeconomic History   Marital status: Married    Spouse name: Not on file   Number of children: Not on file   Years of education: Not on file   Highest education level: Not on file  Occupational History   Not on file  Tobacco Use   Smoking status: Former    Packs/day: 0.25    Years: 10.00    Total pack years: 2.50    Types: Cigarettes    Quit date: 06/16/2020     Years since quitting: 1.5   Smokeless tobacco: Never  Vaping Use   Vaping Use: Never used  Substance and Sexual Activity   Alcohol use: Yes    Alcohol/week: 0.0 standard drinks of alcohol    Comment: socially   Drug use: No   Sexual activity: Yes  Other Topics Concern   Not on file  Social History Narrative   Not on file   Social Determinants of Health   Financial Resource Strain: Low Risk  (07/03/2017)   Overall Financial Resource Strain (CARDIA)    Difficulty of Paying Living Expenses: Not hard at all  Food Insecurity: No Food Insecurity (07/03/2017)   Hunger Vital Sign    Worried About Running Out of Food in the Last Year: Never true    Ran Out of Food in the Last Year: Never true  Transportation Needs: No Transportation Needs (07/03/2017)   PRAPARE - Administrator, Civil Service (Medical): No    Lack of Transportation (Non-Medical): No  Physical Activity: Unknown (07/03/2017)   Exercise Vital Sign    Days of Exercise per Week: Patient refused    Minutes of Exercise per Session: Patient refused  Stress: No Stress Concern Present (07/03/2017)   [employer] of Occupational Health - Occupational Stress Questionnaire    Feeling of Stress :  Only a little  Social Connections: Moderately Integrated (07/03/2017)   Social Connection and Isolation Panel [NHANES]    Frequency of Communication with Friends and Family: Twice a week    Frequency of Social Gatherings with Friends and Family: Three times a week    Attends Religious Services: 1 to 4 times per year    Active Member of Clubs or Organizations: No    Attends Banker Meetings: Never    Marital Status: Married     Family History: The patient's family history includes Cancer in her maternal grandfather and paternal grandfather; Migraines in her sister.  ROS:   Please see the history of present illness.     All other systems reviewed and are negative.  EKGs/Labs/Other Studies Reviewed:    The  following studies were reviewed today:   EKG:  EKG not ordered today.    Recent Labs: 09/08/2021: ALT 14; BUN 15; Creatinine, Ser 0.76; Hemoglobin 12.3; Platelets 284; Potassium 3.6; Sodium 136  Recent Lipid Panel    Component Value Date/Time   CHOL 176 07/22/2020 0824   TRIG 71 07/22/2020 0824   HDL 36 (L) 07/22/2020 0824   CHOLHDL 4.9 (H) 07/22/2020 0824   LDLCALC 127 (H) 07/22/2020 0824     Risk Assessment/Calculations:             Physical Exam:    VS:  BP 118/68 (BP Location: Left Arm, Patient Position: Sitting, Cuff Size: Large)   Pulse 76   Ht 5\' 5"  (1.651 m)   Wt 241 lb 12.8 oz (109.7 kg)   SpO2 99%   BMI 40.24 kg/m     Wt Readings from Last 3 Encounters:  12/28/21 241 lb 12.8 oz (109.7 kg)  11/06/21 232 lb 3.2 oz (105.3 kg)  10/12/21 235 lb (106.6 kg)     GEN:  Well nourished, well developed in no acute distress HEENT: Normal NECK: No JVD; No carotid bruits CARDIAC: RRR, no murmurs, rubs, gallops RESPIRATORY:  Clear to auscultation without rales, wheezing or rhonchi  ABDOMEN: Soft, non-tender, non-distended MUSCULOSKELETAL:  No edema; No deformity  SKIN: Warm and dry NEUROLOGIC:  Alert and oriented x 3 PSYCHIATRIC:  Normal affect   ASSESSMENT:    1. Frequent PVCs   2. Pure hypercholesterolemia   3. Morbid obesity (HCC)    PLAN:    In order of problems listed above:  Frequent PVCs, cardiac monitor with 21.5% burden. Origin appears to be right ventricle/RVOT origin.  Patient on rituximab, this could be contributing as there are reports of rituximab causing PVCs, PACs.  Echocardiogram shows normal EF 55 to 60%.  Due to high PVC burden, refer to EP for any additional input.  Plan repeat echocardiogram annually while on rituximab to monitor EF. Hyperlipidemia, not in statin benefit group, continue low-cholesterol diet. Obesity, low-calorie diet.  Follow-up in 1 year after repeat echo.     Medication Adjustments/Labs and Tests Ordered: Current  medicines are reviewed at length with the patient today.  Concerns regarding medicines are outlined above.  Orders Placed This Encounter  Procedures   Ambulatory referral to Cardiac Electrophysiology   ECHOCARDIOGRAM COMPLETE   No orders of the defined types were placed in this encounter.   Patient Instructions  Medication Instructions:   Your physician recommends that you continue on your current medications as directed. Please refer to the Current Medication list given to you today.   *If you need a refill on your cardiac medications before your next appointment, please call  your pharmacy*   Testing/Procedures:  Your physician has requested that you have an echocardiogram in 1 year. Echocardiography is a painless test that uses sound waves to create images of your heart. It provides your doctor with information about the size and shape of your heart and how well your heart's chambers and valves are working. This procedure takes approximately one hour. There are no restrictions for this procedure. Please do NOT wear cologne, perfume, aftershave, or lotions (deodorant is allowed). Please arrive 15 minutes prior to your appointment time.   Follow-Up: At [facility], you and your health needs are our priority.  As part of our continuing mission to provide you with exceptional heart care, we have created designated Provider Care Teams.  These Care Teams include your primary Cardiologist (physician) and Advanced Practice Providers (APPs -  Physician Assistants and Nurse Practitioners) who all work together to provide you with the care you need, when you need it.  We recommend signing up for the patient portal called "MyChart".  Sign up information is provided on this After Visit Summary.  MyChart is used to connect with patients for Virtual Visits (Telemedicine).  Patients are able to view lab/test results, encounter notes, upcoming appointments, etc.  Non-urgent messages can be  sent to your provider as well.   To learn more about what you can do with MyChart, go to [email or web address].    Your next appointment:     In 1 year after Echo  The format for your next appointment:   In Person  Provider:   Debbe Odea, MD    Other Instructions   Important Information About Sugar         Signed, Debbe Odea, MD  12/28/2021 3:17 PM    [facility]

## 2022-01-26 NOTE — Progress Notes (Unsigned)
Electrophysiology Office Note:    Date:  01/27/2022   ID:  Carly Martin, DOB 08-24-1985, MRN [id number]  PCP:  Reubin Milan, MD  [facility] Cardiologist:  None  [facility] Electrophysiologist:  Lanier Prude, MD   Referring MD: Debbe Odea, MD   Chief Complaint: PVC's  History of Present Illness:    Carly Martin is a 36 y.o. female who presents for an evaluation of PVC's at the request of Dr Azucena Cecil. Their medical history includes MS, prior tobacco abuse, PVCs, morbid obesity.   She was seen by [facility] 12/28/2021. She reported minimal symptoms at her recent office visit. A heart monitor showed a burden of 21.5% PVC's.   The patient is with her family today in clinic.  She reports palpitations when she is having a particular bad day of frequent PVCs.  No syncope or presyncope.     Past Medical History:  Diagnosis Date   Asthma     Past Surgical History:  Procedure Laterality Date   CERVICAL BIOPSY  W/ LOOP ELECTRODE EXCISION  2008   TONSILLECTOMY      Current Medications: Current Meds  Medication Sig   clotrimazole-betamethasone (LOTRISONE) cream Apply 1 application topically daily.   etonogestrel (NEXPLANON) 68 MG IMPL implant 1 each by Subdermal route once.   riTUXimab in sodium chloride 0.9 % 250 mL Inject 1,000 mg into the vein every 6 (six) months.   Vitamin D, Ergocalciferol, (DRISDOL) 1.25 MG (50000 UNIT) CAPS capsule Take 1 capsule by mouth once a week.     Allergies:   Codeine   Social History   Socioeconomic History   Marital status: Married    Spouse name: Not on file   Number of children: Not on file   Years of education: Not on file   Highest education level: Not on file  Occupational History   Not on file  Tobacco Use   Smoking status: Former    Packs/day: 0.25    Years: 10.00    Total pack years: 2.50    Types: Cigarettes    Quit date: 06/16/2020    Years since quitting: 1.6   Smokeless tobacco: Never   Vaping Use   Vaping Use: Never used  Substance and Sexual Activity   Alcohol use: Yes    Alcohol/week: 0.0 standard drinks of alcohol    Comment: socially   Drug use: No   Sexual activity: Yes  Other Topics Concern   Not on file  Social History Narrative   Not on file   Social Determinants of Health   Financial Resource Strain: Low Risk  (07/03/2017)   Overall Financial Resource Strain (CARDIA)    Difficulty of Paying Living Expenses: Not hard at all  Food Insecurity: No Food Insecurity (07/03/2017)   Hunger Vital Sign    Worried About Running Out of Food in the Last Year: Never true    Ran Out of Food in the Last Year: Never true  Transportation Needs: No Transportation Needs (07/03/2017)   PRAPARE - Administrator, Civil Service (Medical): No    Lack of Transportation (Non-Medical): No  Physical Activity: Unknown (07/03/2017)   Exercise Vital Sign    Days of Exercise per Week: Patient refused    Minutes of Exercise per Session: Patient refused  Stress: No Stress Concern Present (07/03/2017)   [employer] of Occupational Health - Occupational Stress Questionnaire    Feeling of Stress : Only a little  Social Connections:  Moderately Integrated (07/03/2017)   Social Connection and Isolation Panel [NHANES]    Frequency of Communication with Friends and Family: Twice a week    Frequency of Social Gatherings with Friends and Family: Three times a week    Attends Religious Services: 1 to 4 times per year    Active Member of Clubs or Organizations: No    Attends Banker Meetings: Never    Marital Status: Married     Family History: The patient's family history includes Cancer in her maternal grandfather and paternal grandfather; Migraines in her sister.  ROS:   Please see the history of present illness.    All other systems reviewed and are negative.  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  I reviewed an echo which shows normal orientation the cardiac structures and the proximal aorta looks to be normal dimensions.  EKG today shows sinus rhythm with bigeminal PVCs that have a steeply inferior axis and precordial transition in V4.   Recent Labs: 09/08/2021: ALT 14; BUN 15; Creatinine, Ser 0.76; Hemoglobin 12.3; Platelets 284; Potassium 3.6; Sodium 136  Recent Lipid Panel    Component Value Date/Time   CHOL 176 07/22/2020 0824   TRIG 71 07/22/2020 0824   HDL 36 (L) 07/22/2020 0824   CHOLHDL 4.9 (H) 07/22/2020 0824   LDLCALC 127 (H) 07/22/2020 0824    Physical Exam:    VS:  BP 124/72 (BP Location: Left Arm, Patient Position: Sitting, Cuff Size: Normal)   Pulse (!) 103   Ht 5\' 5"  (1.651 m)   Wt 239 lb 12.8 oz (108.8 kg)   SpO2 97%   BMI 39.90 kg/m     Wt Readings from Last 3 Encounters:  01/27/22 239 lb 12.8 oz (108.8 kg)  12/28/21 241 lb 12.8 oz (109.7 kg)  11/06/21 232 lb 3.2 oz (105.3 kg)     GEN:  Well nourished, well developed in no acute distress.  Obese HEENT: Normal NECK: No JVD; No carotid bruits LYMPHATICS: No lymphadenopathy CARDIAC: RRR, no murmurs, rubs, gallops RESPIRATORY:  Clear to auscultation without rales, wheezing or rhonchi  ABDOMEN: Soft, non-tender, non-distended MUSCULOSKELETAL:  No edema; No deformity  SKIN: Warm and dry NEUROLOGIC:  Alert and oriented x 3 PSYCHIATRIC:  Normal affect       ASSESSMENT:    1. Frequent PVCs   2. Morbid obesity (HCC)    PLAN:    In order of problems listed above:  #PVC's Minimally symptomatic, high burden, monomorphic. Appearance suggestive of LVOT>RVOT origin.  Recommend MRI of the heart and aorta to assess the  myocardium and also her aorta given the report of a right-sided aortic arch.  Start diltiazem 120mg  PO BID.  I did discuss this medication and possible side effects in detail with the patient and her family.  She will let 01/06/22 know if she does not tolerate the drug and we could consider an alternative such as metoprolol.  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  MRI as above.  Will follow-up with Dr. .  Follow-up 3 months with me.       Medication Adjustments/Labs and Tests Ordered: Current medicines are reviewed at length with the  patient today.  Concerns regarding medicines are outlined above.  Orders Placed This Encounter  Procedures   EKG 12-Lead   No orders of the defined types were placed in this encounter.    Signed, Rossie Muskrat. Lalla Brothers, MD, [facility], [facility] 01/27/2022 9:26 AM    Electrophysiology [facility]

## 2022-01-27 ENCOUNTER — Ambulatory Visit: Payer: POS | Attending: Cardiology | Admitting: Cardiology

## 2022-01-27 ENCOUNTER — Encounter: Payer: Self-pay | Admitting: Cardiology

## 2022-01-27 VITALS — BP 124/72 | HR 103 | Ht 65.0 in | Wt 239.8 lb

## 2022-01-27 DIAGNOSIS — I493 Ventricular premature depolarization: Secondary | ICD-10-CM

## 2022-01-27 MED ORDER — DILTIAZEM HCL ER COATED BEADS 120 MG PO CP24: 120.0000 mg | ORAL_CAPSULE | Freq: Two times a day (BID) | ORAL | 3 refills | Status: DC

## 2022-01-27 NOTE — Patient Instructions (Addendum)
Medication Instructions:  Your physician has recommended you make the following change in your medication: 1) START taking diltiazem 120 mg twice daily  *If you need a refill on your cardiac medications before your next appointment, please call your pharmacy*  Testing/Procedures: Your physician has requested that you have a cardiac MRI. Cardiac MRI uses a computer to create images of your heart as its beating, producing both still and moving pictures of your heart and major blood vessels. Please follow the instruction sheet given to you today for more information.  Follow up: At [facility], you and your health needs are our priority.  As part of our continuing mission to provide you with exceptional heart care, we have created designated Provider Care Teams.  These Care Teams include your primary Cardiologist (physician) and Advanced Practice Providers (APPs -  Physician Assistants and Nurse Practitioners) who all work together to provide you with the care you need, when you need it.  Your next appointment:   3 month(s)  The format for your next appointment:   In Person  Provider:   You may see Lanier Prude, MD or one of the following Advanced Practice Providers on your designated Care Team:   Francis Dowse, [place] Casimiro Needle "Mardelle Matte" Lanna Poche, PA-C       [facility] [address] [place], [place] [address] [phone] Please take advantage of the free valet parking available at the MAIN entrance (A entrance).  Proceed to the [facility] Radiology Department (First Floor) for check-in.   OR   [facility] [address] [place], [place] [address] [phone] Please take advantage of the free valet parking available at the MAIN entrance. Proceed to [facility] registration for check-in (first floor).  Magnetic resonance imaging (MRI) is a painless test that produces images of the inside of the body without using Xrays.  During an MRI,  strong magnets and radio waves work together in a Data processing manager to form detailed images.   MRI images may provide more details about a medical condition than X-rays, CT scans, and ultrasounds can provide.  You may be given earphones to listen for instructions.  You may eat a light breakfast and take medications as ordered with the exception of HCTZ (fluid pill, other). Please avoid stimulants for 12 hr prior to test. (Ie. Caffeine, nicotine, chocolate, or antihistamine medications)  If a contrast material will be used, an IV will be inserted into one of your veins. Contrast material will be injected into your IV. It will leave your body through your urine within a day. You may be told to drink plenty of fluids to help flush the contrast material out of your system.  You will be asked to remove all metal, including: Watch, jewelry, and other metal objects including hearing aids, hair pieces and dentures. Also wearable glucose monitoring systems (ie. Freestyle Libre and Omnipods) (Braces and fillings normally are not a problem.)   TEST WILL TAKE APPROXIMATELY 1 HOUR  PLEASE NOTIFY SCHEDULING AT LEAST 24 HOURS IN ADVANCE IF YOU ARE UNABLE TO KEEP YOUR APPOINTMENT. [phone]  Please call Rockwell Alexandria, cardiac imaging nurse navigator with any questions/concerns. Rockwell Alexandria RN Navigator Cardiac Imaging Larey Brick RN Navigator Cardiac Imaging [facility] [phone] Office     Important Information About Sugar

## 2022-01-28 ENCOUNTER — Encounter: Payer: Self-pay | Admitting: Cardiology

## 2022-01-28 NOTE — Addendum Note (Signed)
Addended by: Frutoso Schatz on: 01/28/2022 09:54 AM   Modules accepted: Orders

## 2022-01-30 ENCOUNTER — Encounter: Payer: Self-pay | Admitting: Hematology and Oncology

## 2022-01-30 ENCOUNTER — Ambulatory Visit
Admission: EM | Admit: 2022-01-30 | Discharge: 2022-01-30 | Disposition: A | Discharge status: Home / Self Care | Payer: POS | Attending: Family Medicine | Admitting: Family Medicine

## 2022-01-30 ENCOUNTER — Other Ambulatory Visit: Payer: Self-pay

## 2022-01-30 DIAGNOSIS — J45909 Unspecified asthma, uncomplicated: Secondary | ICD-10-CM | POA: Insufficient documentation

## 2022-01-30 DIAGNOSIS — Z79899 Other long term (current) drug therapy: Secondary | ICD-10-CM | POA: Insufficient documentation

## 2022-01-30 DIAGNOSIS — E785 Hyperlipidemia, unspecified: Secondary | ICD-10-CM | POA: Insufficient documentation

## 2022-01-30 DIAGNOSIS — J3489 Other specified disorders of nose and nasal sinuses: Secondary | ICD-10-CM | POA: Diagnosis not present

## 2022-01-30 DIAGNOSIS — R058 Other specified cough: Secondary | ICD-10-CM | POA: Diagnosis not present

## 2022-01-30 DIAGNOSIS — R002 Palpitations: Secondary | ICD-10-CM | POA: Diagnosis not present

## 2022-01-30 DIAGNOSIS — R519 Headache, unspecified: Secondary | ICD-10-CM | POA: Diagnosis not present

## 2022-01-30 DIAGNOSIS — Z1152 Encounter for screening for COVID-19: Secondary | ICD-10-CM | POA: Insufficient documentation

## 2022-01-30 DIAGNOSIS — I493 Ventricular premature depolarization: Secondary | ICD-10-CM | POA: Insufficient documentation

## 2022-01-30 DIAGNOSIS — Z6839 Body mass index (BMI) 39.0-39.9, adult: Secondary | ICD-10-CM | POA: Insufficient documentation

## 2022-01-30 DIAGNOSIS — J029 Acute pharyngitis, unspecified: Secondary | ICD-10-CM | POA: Insufficient documentation

## 2022-01-30 DIAGNOSIS — E669 Obesity, unspecified: Secondary | ICD-10-CM | POA: Diagnosis not present

## 2022-01-30 DIAGNOSIS — M2550 Pain in unspecified joint: Secondary | ICD-10-CM | POA: Insufficient documentation

## 2022-01-30 DIAGNOSIS — J069 Acute upper respiratory infection, unspecified: Secondary | ICD-10-CM | POA: Diagnosis present

## 2022-01-30 DIAGNOSIS — R112 Nausea with vomiting, unspecified: Secondary | ICD-10-CM | POA: Insufficient documentation

## 2022-01-30 DIAGNOSIS — H9209 Otalgia, unspecified ear: Secondary | ICD-10-CM | POA: Diagnosis not present

## 2022-01-30 LAB — SARS CORONAVIRUS 2 BY RT PCR: SARS Coronavirus 2 by RT PCR: NEGATIVE

## 2022-01-30 MED ORDER — PROMETHAZINE-DM 6.25-15 MG/5ML PO SYRP: 5.0000 mL | ORAL_SOLUTION | Freq: Four times a day (QID) | ORAL | 0 refills | Status: DC | PRN

## 2022-01-30 MED ORDER — IPRATROPIUM BROMIDE 0.06 % NA SOLN: 2.0000 | Freq: Four times a day (QID) | NASAL | 12 refills | Status: DC

## 2022-01-30 MED ORDER — BENZONATATE 100 MG PO CAPS: 200.0000 mg | ORAL_CAPSULE | Freq: Three times a day (TID) | ORAL | 0 refills | Status: DC

## 2022-01-30 NOTE — ED Provider Notes (Signed)
[facility]    CSN: [id number] Arrival date & time: 01/30/22  0807      History   Chief Complaint Chief Complaint  Patient presents with   Generalized Body Aches    HPI Carly Martin is a 36 y.o. female.   HPI  36 year old female here for evaluation of respiratory symptoms.  Patient has significant past medical history to include MS, IDA, hyperlipidemia, obesity, and frequent PVCs.  She was recently started on diltiazem for the PVCs but states that she has not started taking the medication.  Her symptoms began 3 days ago and they consist of a subjective fever with headache and body aches, runny nose with clear nasal discharge, sneezing, sore throat which has resolved, ear pain nonproductive cough, single episode of emesis yesterday and nausea today.  She denies any sinus pressure.  She states she has had an increase in PVCs since becoming ill but she has not had any syncope or presyncope.  Her husband has similar symptoms but she denies any other sick contacts or recent travel.  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      Home Medications    Prior to  Admission medications   Medication Sig Start Date End Date Taking? Authorizing Provider  benzonatate (TESSALON) 100 MG capsule Take 2 capsules (200 mg total) by mouth every 8 (eight) hours. 01/30/22  Yes Margarette Canada, NP  ipratropium (ATROVENT) 0.06 % nasal spray Place 2 sprays into both nostrils 4 (four) times daily. 01/30/22  Yes Margarette Canada, NP  promethazine-dextromethorphan (PROMETHAZINE-DM) 6.25-15 MG/5ML syrup Take 5 mLs by mouth 4 (four) times daily as needed. 01/30/22  Yes Margarette Canada, NP  clotrimazole-betamethasone (LOTRISONE) cream Apply 1 application topically daily. 06/13/20   Glean Hess, MD  diltiazem (CARDIZEM CD) 120 MG 24 hr capsule Take 1 capsule (120 mg total) by mouth in the morning and at bedtime. 01/27/22   Vickie Epley, MD  etonogestrel (NEXPLANON) 68 MG IMPL implant 1 each by Subdermal route once.    [provider]  riTUXimab in sodium chloride 0.9 % 250 mL Inject 1,000 mg into the vein every 6 (six) months.    [provider]  Vitamin D, Ergocalciferol, (DRISDOL) 1.25 MG (50000 UNIT) CAPS capsule Take 1 capsule by mouth once a week. 03/17/20   [provider]    Family History Family History  Problem Relation Age of Onset   Migraines Sister    Cancer Maternal Grandfather    Cancer Paternal Grandfather     Social History Social History   Tobacco Use   Smoking status: Former    Packs/day: 0.25    Years:  10.00    Total pack years: 2.50    Types: Cigarettes    Quit date: 06/16/2020    Years since quitting: 1.6   Smokeless tobacco: Never  Vaping Use   Vaping Use: Never used  Substance Use Topics   Alcohol use: Yes    Alcohol/week: 0.0 standard drinks of alcohol    Comment: socially   Drug use: No     Allergies   Codeine   Review of Systems Review of Systems  Constitutional:  Positive for chills, diaphoresis and fever.  HENT:  Positive for congestion, ear pain, rhinorrhea and sore throat.   Respiratory:   Positive for cough.   Cardiovascular:  Positive for palpitations.  Gastrointestinal:  Positive for nausea and vomiting. Negative for diarrhea.  Musculoskeletal:  Positive for arthralgias and myalgias.  Neurological:  Positive for headaches. Negative for syncope.  Hematological: Negative.   Psychiatric/Behavioral: Negative.       Physical Exam Triage Vital Signs ED Triage Vitals  Enc Vitals Group     BP      Pulse      Resp      Temp      Temp src      SpO2      Weight      Height      Head Circumference      Peak Flow      Pain Score      Pain Loc      Pain Edu?      Excl. in GC?    No data found.  Updated Vital Signs BP 106/70 (BP Location: Left Arm)   Pulse 100   Resp 17   Ht 5\' 5"  (1.651 m)   Wt 239 lb 10.2 oz (108.7 kg)   SpO2 97%   BMI 39.88 kg/m   Visual Acuity Right Eye Distance:   Left Eye Distance:   Bilateral Distance:    Right Eye Near:   Left Eye Near:    Bilateral Near:     Physical Exam Vitals and nursing note reviewed.  Constitutional:      Appearance: Normal appearance. She is not ill-appearing.  HENT:     Head: Normocephalic and atraumatic.     Right Ear: Tympanic membrane, ear canal and external ear normal. There is no impacted cerumen.     Left Ear: Tympanic membrane, ear canal and external ear normal. There is no impacted cerumen.     Nose: Congestion and rhinorrhea present.     Comments: Nasal mucosa is erythematous without significant edema.  There is clear discharge in both nares.    Mouth/Throat:     Mouth: Mucous membranes are moist.     Pharynx: Oropharynx is clear. Posterior oropharyngeal erythema present.     Comments: Posterior oropharynx has mild erythema but no injection.  There is faint clear postnasal drip present on exam. Cardiovascular:     Rate and Rhythm: Normal rate and regular rhythm.     Pulses: Normal pulses.     Heart sounds: Normal heart sounds. No murmur heard.    No friction rub. No gallop.  Pulmonary:      Effort: Pulmonary effort is normal.     Breath sounds: Normal breath sounds. No wheezing, rhonchi or rales.  Musculoskeletal:     Cervical back: Normal range of motion and neck supple.  Lymphadenopathy:     Cervical: No cervical adenopathy.  Skin:    General: Skin is warm and dry.  Capillary Refill: Capillary refill takes less than 2 seconds.     Findings: No erythema or rash.  Neurological:     General: No focal deficit present.     Mental Status: She is alert and oriented to person, place, and time.  Psychiatric:        Mood and Affect: Mood normal.        Behavior: Behavior normal.        Thought Content: Thought content normal.        Judgment: Judgment normal.      UC Treatments / Results  Labs (all labs ordered are listed, but only abnormal results are displayed) Labs Reviewed  SARS CORONAVIRUS 2 BY RT PCR    EKG   Radiology No results found.  Procedures Procedures (including critical care time)  Medications Ordered in UC Medications - No data to display  Initial Impression / Assessment and Plan / UC Course  I have reviewed the triage vital signs and the nursing notes.  Pertinent labs & imaging results that were available during my care of the patient were reviewed by me and considered in my medical decision making (see chart for details).   Patient is a pleasant, nontoxic-appearing 69-year-old female here for evaluation of flu/COVID-like symptoms that have been present for last 3 days.  As mentioned in HPI above, her husband has similar symptoms but she denies any known sick contacts or recent travel.  Patient is not in any acute distress and she is able to speak in full sentences without dyspnea or tachypnea.  Her upper respiratory tree does show signs of inflammation with clear nasal drainage and postnasal drip.  Her lung sounds are clear to auscultation in all fields.  She is outside the therapeutic window for Tamiflu so I will not swab her for flu at  this time but I will swab her for COVID.  If she is COVID-positive I will start her on Paxlovid given her significant past medical history and comorbidities.  COVID PCR is negative.  I will discharge patient with a diagnosis of viral URI with cough and offer her Atrovent nasal spray, Tessalon Perles, Promethazine DM cough syrup.  Increase fluids to maintain hydration and mucus thin.  Over-the-counter Tylenol ibuprofen as needed for body aches and fever.  Return precautions reviewed.   Final Clinical Impressions(s) / UC Diagnoses   Final diagnoses:  Viral URI with cough     Discharge Instructions      Your test today for COVID was negative.  I do believe that you have an upper respiratory infection that is most likely viral.  Please use over-the-counter Tylenol and ibuprofen according to package instructions as needed for fever and pain.  Increase your oral fluid intake such help keep you mucus since was easier for you to clear.  Use the Atrovent nasal spray, 2 squirts in each nostril every 6 hours, as needed for runny nose and postnasal drip.  Use the Tessalon Perles every 8 hours during the day.  Take them with a small sip of water.  They may give you some numbness to the base of your tongue or a metallic taste in your mouth, this is normal.  Use the Promethazine DM cough syrup at bedtime for cough and congestion.  It will make you drowsy so do not take it during the day.  Return for reevaluation or see your primary care provider for any new or worsening symptoms.      ED Prescriptions  Medication Sig Dispense Auth. Provider   benzonatate (TESSALON) 100 MG capsule Take 2 capsules (200 mg total) by mouth every 8 (eight) hours. 21 capsule Margarette Canada, NP   ipratropium (ATROVENT) 0.06 % nasal spray Place 2 sprays into both nostrils 4 (four) times daily. 15 mL Margarette Canada, NP   promethazine-dextromethorphan (PROMETHAZINE-DM) 6.25-15 MG/5ML syrup Take 5 mLs by mouth 4 (four)  times daily as needed. 118 mL Margarette Canada, NP      PDMP not reviewed this encounter.   Margarette Canada, NP 01/30/22 [phone]

## 2022-01-30 NOTE — Discharge Instructions (Addendum)
Your test today for COVID was negative.  I do believe that you have an upper respiratory infection that is most likely viral.  Please use over-the-counter Tylenol and ibuprofen according to package instructions as needed for fever and pain.  Increase your oral fluid intake such help keep you mucus since was easier for you to clear.  Use the Atrovent nasal spray, 2 squirts in each nostril every 6 hours, as needed for runny nose and postnasal drip.  Use the Tessalon Perles every 8 hours during the day.  Take them with a small sip of water.  They may give you some numbness to the base of your tongue or a metallic taste in your mouth, this is normal.  Use the Promethazine DM cough syrup at bedtime for cough and congestion.  It will make you drowsy so do not take it during the day.  Return for reevaluation or see your primary care provider for any new or worsening symptoms.

## 2022-01-30 NOTE — ED Triage Notes (Signed)
Late Wednesday night developed symptoms of body aches, cough, subjective fever, chills/sweats, vomited once on Thursday night. Endorses nausea today.

## 2022-03-01 ENCOUNTER — Encounter: Payer: Self-pay | Admitting: Hematology and Oncology

## 2022-03-02 ENCOUNTER — Encounter: Payer: Self-pay | Admitting: Cardiology

## 2022-03-03 ENCOUNTER — Ambulatory Visit: Payer: 59

## 2022-03-04 ENCOUNTER — Encounter: Payer: Self-pay | Admitting: Hematology and Oncology

## 2022-03-09 ENCOUNTER — Encounter: Payer: Self-pay | Admitting: Hematology and Oncology

## 2022-03-23 ENCOUNTER — Telehealth (HOSPITAL_COMMUNITY): Payer: Self-pay | Admitting: *Deleted

## 2022-03-23 NOTE — Telephone Encounter (Signed)
Calling patient to cancel cardiac MRI appointment due to lack of authorization. She is aware she will be contacted to reschedule.  Gordy Clement RN Navigator Cardiac Imaging [facility] V[facility] Services [phone] Office [phone] Cell

## 2022-03-24 ENCOUNTER — Ambulatory Visit: Admission: RE | Admit: 2022-03-24 | Payer: 59 | Source: Ambulatory Visit

## 2022-04-12 ENCOUNTER — Other Ambulatory Visit: Payer: Self-pay | Admitting: Hematology and Oncology

## 2022-04-14 ENCOUNTER — Other Ambulatory Visit: Payer: Self-pay | Admitting: Oncology

## 2022-04-22 ENCOUNTER — Encounter: Payer: Self-pay | Admitting: Cardiology

## 2022-04-25 NOTE — Progress Notes (Unsigned)
  Electrophysiology Office Follow up Visit Note:    Date:  04/28/2022   ID:  Carly Martin, DOB 29-May-1985, MRN [id number]  PCP:  Glean Hess, MD  [place] Cardiologist:  None  [facility] Electrophysiologist:  Vickie Epley, MD    Interval History:    Carly Martin is a 37 y.o. female who presents for a follow up visit.   I last saw the patient January 27, 2022 for frequent PVCs. We tried to order a cardiac MRI but this was denied by her insurance.  I think it was confusion regarding the indication for the MRI.  The MRI is being requested to evaluate for infiltrative disease given the frequent PVCs and her young age.      Past medical, surgical, social and family history were reviewed.  ROS:   Please see the history of present illness.    All other systems reviewed and are negative.  EKGs/Labs/Other Studies Reviewed:    The following studies were reviewed today:  December 23, 2021 echo EF 55 to 60% RV normal No significant valvular abnormalities    EKG:  The ekg ordered today demonstrates sinus rhythm with frequent monomorphic PVCs.   Physical Exam:    VS:  BP 120/76   Pulse 94   Ht 5\' 5"  (1.651 m)   Wt 236 lb (107 kg)   SpO2 99%   BMI 39.27 kg/m     Wt Readings from Last 3 Encounters:  04/28/22 236 lb (107 kg)  01/30/22 239 lb 10.2 oz (108.7 kg)  01/27/22 239 lb 12.8 oz (108.8 kg)     GEN:  Well nourished, well developed in no acute distress CARDIAC: Irregular rhythm, no murmurs, rubs, gallops RESPIRATORY:  Clear to auscultation without rales, wheezing or rhonchi       ASSESSMENT:    1. Frequent PVCs   2. Morbid obesity ([place])    PLAN:    In order of problems listed above:   #Frequent PVCs Minimally symptomatic.  No significant LV dysfunction.  I would like a cardiac MRI to assess the myocardium for evidence of LGE, edema to help clarify the reason she is having such a high burden of PVCs.  #Right-sided aortic  arch Seen previously on MRI.  Follow-up 1 year with APP or sooner as needed.    Signed, Lars Mage, MD, [facility], [facility] 04/28/2022 10:20 AM    Electrophysiology [facility]

## 2022-04-27 ENCOUNTER — Other Ambulatory Visit: Payer: Self-pay

## 2022-04-27 ENCOUNTER — Telehealth (HOSPITAL_COMMUNITY): Payer: Self-pay | Admitting: *Deleted

## 2022-04-27 ENCOUNTER — Encounter: Payer: Self-pay | Admitting: Cardiology

## 2022-04-27 DIAGNOSIS — R002 Palpitations: Secondary | ICD-10-CM

## 2022-04-27 DIAGNOSIS — E785 Hyperlipidemia, unspecified: Secondary | ICD-10-CM

## 2022-04-27 DIAGNOSIS — E7800 Pure hypercholesterolemia, unspecified: Secondary | ICD-10-CM

## 2022-04-27 DIAGNOSIS — I493 Ventricular premature depolarization: Secondary | ICD-10-CM

## 2022-04-27 NOTE — Telephone Encounter (Signed)
Calling patient to cancel her cardiac MRI due to the auth being denied. Patient verbalized understanding and will follow up with Dr. Quentin Ore.  Gordy Clement RN Navigator Cardiac Imaging [facility] Heart and Vascular Services [phone] Office [phone] Cell

## 2022-04-27 NOTE — Progress Notes (Signed)
MRI ordered to evaluate for infiltrative disease as cause of frequent PVCs.

## 2022-04-28 ENCOUNTER — Ambulatory Visit: Admission: RE | Admit: 2022-04-28 | Payer: 59 | Source: Ambulatory Visit

## 2022-04-28 ENCOUNTER — Encounter: Payer: Self-pay | Admitting: Cardiology

## 2022-04-28 ENCOUNTER — Ambulatory Visit: Payer: 59 | Attending: Cardiology | Admitting: Cardiology

## 2022-04-28 ENCOUNTER — Ambulatory Visit: Payer: 59 | Admitting: Cardiology

## 2022-04-28 VITALS — BP 120/76 | HR 94 | Ht 65.0 in | Wt 236.0 lb

## 2022-04-28 DIAGNOSIS — I493 Ventricular premature depolarization: Secondary | ICD-10-CM | POA: Diagnosis not present

## 2022-04-28 NOTE — Patient Instructions (Signed)
Medication Instructions:  Your physician recommends that you continue on your current medications as directed. Please refer to the Current Medication list given to you today.  *If you need a refill on your cardiac medications before your next appointment, please call your pharmacy*  Follow-Up: At [facility], you and your health needs are our priority.  As part of our continuing mission to provide you with exceptional heart care, we have created designated Provider Care Teams.  These Care Teams include your primary Cardiologist (physician) and Advanced Practice Providers (APPs -  Physician Assistants and Nurse Practitioners) who all work together to provide you with the care you need, when you need it.  Your next appointment:   1 year(s)  Provider:   You will see one of the following Advanced Practice Providers on your designated Care Team:   Renee Ursuy, PA-C Michael "Andy" Tillery, PA-C Suzann Riddle, NP  

## 2022-04-29 ENCOUNTER — Encounter: Payer: Self-pay | Admitting: Physician Assistant

## 2022-04-29 ENCOUNTER — Ambulatory Visit: Payer: 59 | Admitting: Physician Assistant

## 2022-04-29 VITALS — BP 118/72 | HR 78 | Temp 98.1°F | Ht 65.0 in | Wt 238.0 lb

## 2022-04-29 DIAGNOSIS — M542 Cervicalgia: Secondary | ICD-10-CM | POA: Diagnosis not present

## 2022-04-29 DIAGNOSIS — H9202 Otalgia, left ear: Secondary | ICD-10-CM | POA: Diagnosis not present

## 2022-04-29 DIAGNOSIS — G35 Multiple sclerosis: Secondary | ICD-10-CM | POA: Insufficient documentation

## 2022-04-29 NOTE — Progress Notes (Signed)
Date:  04/29/2022   Name:  Carly Martin   DOB:  1985/11/13   MRN:  [id number]   Chief Complaint: Ear Pain  HPI Yusra is a pleasant 37 year old female with a history of MS new to me today here for evaluation of acute left-sided URI symptoms including otalgia, neck pain, sinus congestion, sore throat.  Ear pain is constant but mild 3/10 intensity during the day but worsens to 6/10 at night.  Last night she tried some OTC eardrops from [facility] which briefly provided relief but only for about 5 minutes.  Patient endorses environmental allergies for which she takes Zyrtec daily as of 1 month ago.  No sick family members.  Denies fever, chills, cough, wheezing, vision changes, hearing loss, tinnitus, facial pain, NVD.  Recent Labs     Component Value Date/Time   NA 136 09/08/2021 1250   NA 137 07/22/2020 0824   K 3.6 09/08/2021 1250   CL 103 09/08/2021 1250   CO2 25 09/08/2021 1250   GLUCOSE 74 09/08/2021 1250   BUN 15 09/08/2021 1250   BUN 11 07/22/2020 0824   CREATININE 0.76 09/08/2021 1250   CALCIUM 9.2 09/08/2021 1250   PROT 7.8 09/08/2021 1250   PROT 6.9 12/01/2020 1119   ALBUMIN 4.2 09/08/2021 1250   ALBUMIN 4.5 12/01/2020 1119   AST 14 (L) 09/08/2021 1250   ALT 14 09/08/2021 1250   ALKPHOS 49 09/08/2021 1250   BILITOT 1.0 09/08/2021 1250   BILITOT 0.4 12/01/2020 1119   GFRNONAA >60 09/08/2021 1250   GFRAA 125 10/29/2019 1002    Lab Results  Component Value Date   WBC 10.2 09/08/2021   HGB 12.3 09/08/2021   HCT 38.7 09/08/2021   MCV 89.6 09/08/2021   PLT 284 09/08/2021   Lab Results  Component Value Date   HGBA1C 5.7 (H) 01/22/2021   Lab Results  Component Value Date   CHOL 176 07/22/2020   HDL 36 (L) 07/22/2020   LDLCALC 127 (H) 07/22/2020   TRIG 71 07/22/2020   CHOLHDL 4.9 (H) 07/22/2020   Lab Results  Component Value Date   TSH 1.390 07/22/2020    Review of Systems  HENT:  Positive for ear pain, rhinorrhea and sore throat.    Musculoskeletal:  Positive for neck pain.    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   Irritable bowel syndrome with constipation 09/28/2016   Chronic tension-type headache, not intractable 09/03/2015   Sciatica of right side 03/17/2015   Iron deficiency anemia 03/17/2015   History of loop electrical excision procedure (LEEP) 09/28/2013    Allergies  Allergen Reactions   Codeine Shortness Of Breath    Past Surgical History:  Procedure Laterality Date   CERVICAL BIOPSY  W/ LOOP ELECTRODE EXCISION  2008   TONSILLECTOMY      Social History   Tobacco Use   Smoking status: Former    Packs/day: 0.25    Years: 10.00    Additional pack years: 0.00    Total pack years: 2.50    Types: Cigarettes    Quit date: 06/16/2020    Years since quitting: 1.8   Smokeless  tobacco: Never  Vaping Use   Vaping Use: Never used  Substance Use Topics   Alcohol use: Yes    Alcohol/week: 0.0 standard drinks of alcohol    Comment: socially   Drug use: No     Medication list has been reviewed and updated.  Current Meds  Medication Sig   diltiazem (CARDIZEM CD) 120 MG 24 hr capsule Take 1 capsule (120 mg total) by mouth in the morning and at bedtime.   etonogestrel (NEXPLANON) 68 MG IMPL implant 1 each by Subdermal route once.   omeprazole (PRILOSEC) 20 MG capsule Take 20 mg by mouth daily.   riTUXimab in sodium chloride 0.9 % 250 mL Inject 1,000 mg into the vein every 6 (six) months.   Vitamin D, Ergocalciferol, (DRISDOL) 1.25 MG (50000 UNIT) CAPS capsule Take 1 capsule by mouth once a week.   [DISCONTINUED] benzonatate  (TESSALON) 100 MG capsule Take 2 capsules (200 mg total) by mouth every 8 (eight) hours.       04/29/2022    4:08 PM 03/31/2021   11:21 AM 03/24/2021    9:02 AM 02/19/2021    8:35 AM  GAD 7 : Generalized Anxiety Score  Nervous, Anxious, on Edge 0 0 0 0  Control/stop worrying 0 0 0 0  Worry too much - different things 0 0 0 0  Trouble relaxing 0 0 0 0  Restless 0 0 0 0  Easily annoyed or irritable 0 0 0 0  Afraid - awful might happen 0 0 0 0  Total GAD 7 Score 0 0 0 0  Anxiety Difficulty Not difficult at all   Not difficult at all       04/29/2022    4:07 PM 03/31/2021   11:20 AM 03/24/2021    9:02 AM  Depression screen PHQ 2/9  Decreased Interest 0 0 0  Down, Depressed, Hopeless 0 0 0  PHQ - 2 Score 0 0 0  Altered sleeping 0 0 0  Tired, decreased energy 0 0 1  Change in appetite 0 0 0  Feeling bad or failure about yourself  0 1 0  Trouble concentrating 0 0 1  Moving slowly or fidgety/restless 0 0 0  Suicidal thoughts 0 0 0  PHQ-9 Score 0 1 2  Difficult doing work/chores Not difficult at all Not difficult at all Somewhat difficult    BP Readings from Last 3 Encounters:  04/29/22 118/72  04/28/22 120/76  01/30/22 106/70    Physical Exam Vitals and nursing note reviewed.  Constitutional:      General: She is not in acute distress.    Appearance: Normal appearance.  HENT:     Right Ear: A middle ear effusion is present. Tympanic membrane is not erythematous.     Left Ear: Tympanic membrane normal.     Nose: Nose normal.     Right Sinus: No maxillary sinus tenderness.     Left Sinus: No maxillary sinus tenderness.     Mouth/Throat:     Pharynx: No oropharyngeal exudate or posterior oropharyngeal erythema.     Comments: Soft palate somewhat asymmetrical but with appropriate lift with phonation. Airway clear. Tonsil remnants both sides s/p tonsillectomy age 67. No exudate Cardiovascular:     Rate and Rhythm: Normal rate and regular rhythm.     Heart sounds: Normal  heart sounds.  Pulmonary:     Effort: Pulmonary effort is normal.     Breath sounds: Normal breath sounds. No wheezing or rales.  Lymphadenopathy:     Cervical: Cervical adenopathy present.     Left cervical: Superficial cervical adenopathy present.     Comments: Moderate to severe left-sided superior cervical/submandibular tenderness even with light palpation.  No discrete lymph nodes appreciated.  No edema, erythema, or asymmetry of the neck     Wt Readings from Last 3 Encounters:  04/29/22 238 lb (108 kg)  04/28/22 236 lb (107 kg)  01/30/22 239 lb 10.2 oz (108.7 kg)    BP 118/72   Pulse 78   Temp 98.1 F (36.7 C) (Oral)   Ht 5\' 5"  (1.651 m)   Wt 238 lb (108 kg)   SpO2 98%   BMI 39.61 kg/m   Assessment and Plan:  1. Neck pain, acute Possibly URI, but unusual to present one-sided.  Advised patient to take naproxen 1-2 tablets twice daily for the next few days which should help with pain and any inflammation.  Advised if acutely worsening symptoms, patient may proceed to urgent care or ED especially in the context of fever or difficulty breathing  2. Acute ear pain, left Plan as above.  Mild right-sided effusion noted today, but this is the asymptomatic side.  Continue daily antihistamine   Return in about 5 days (around 05/04/2022) for f/u left ear and neck pain.  Consider repeat CBC, though this was just done February 2024 and normal  Partially dictated using Editor, commissioning. Any errors are unintentional.  Lupita Leash, PA-C, D[facility], [place] [phone]

## 2022-05-04 ENCOUNTER — Ambulatory Visit: Payer: 59 | Admitting: Internal Medicine

## 2022-05-13 ENCOUNTER — Ambulatory Visit
Admission: RE | Admit: 2022-05-13 | Discharge: 2022-05-13 | Disposition: A | Discharge status: Home / Self Care | Payer: 59 | Source: Ambulatory Visit | Attending: Neurology | Admitting: Neurology

## 2022-05-13 DIAGNOSIS — M542 Cervicalgia: Secondary | ICD-10-CM | POA: Diagnosis present

## 2022-05-13 MED ORDER — METHYLPREDNISOLONE SODIUM SUCC 125 MG IJ SOLR
100.0000 mg | Freq: Once | INTRAMUSCULAR | Status: AC
  Administered 2022-05-13: 100 mg via INTRAVENOUS

## 2022-05-13 MED ORDER — ACETAMINOPHEN 325 MG PO TABS
ORAL_TABLET | ORAL | Status: AC
  Filled 2022-05-13: qty 2

## 2022-05-13 MED ORDER — DIPHENHYDRAMINE HCL 25 MG PO CAPS
ORAL_CAPSULE | ORAL | Status: AC
  Filled 2022-05-13: qty 1

## 2022-05-13 MED ORDER — DIPHENHYDRAMINE HCL 25 MG PO CAPS
25.0000 mg | ORAL_CAPSULE | Freq: Once | ORAL | Status: AC
  Administered 2022-05-13: 25 mg via ORAL

## 2022-05-13 MED ORDER — ACETAMINOPHEN 325 MG PO TABS
650.0000 mg | ORAL_TABLET | Freq: Once | ORAL | Status: AC
  Administered 2022-05-13: 650 mg via ORAL

## 2022-05-13 MED ORDER — SODIUM CHLORIDE 0.9 % IV SOLN
1000.0000 mg | Freq: Once | INTRAVENOUS | Status: AC
  Administered 2022-05-13: 1000 mg via INTRAVENOUS
  Filled 2022-05-13: qty 100

## 2022-05-13 MED ORDER — METHYLPREDNISOLONE SODIUM SUCC 125 MG IJ SOLR
INTRAMUSCULAR | Status: AC
  Filled 2022-05-13: qty 2

## 2022-06-02 ENCOUNTER — Ambulatory Visit: Payer: 59 | Admitting: Cardiology

## 2022-07-14 ENCOUNTER — Ambulatory Visit (HOSPITAL_COMMUNITY): Admission: RE | Admit: 2022-07-14 | Payer: 59 | Source: Ambulatory Visit

## 2022-09-07 ENCOUNTER — Encounter: Payer: Self-pay | Admitting: Internal Medicine

## 2022-09-07 ENCOUNTER — Encounter: Payer: Self-pay | Admitting: Cardiology

## 2022-12-21 ENCOUNTER — Other Ambulatory Visit: Payer: Self-pay

## 2022-12-21 DIAGNOSIS — I493 Ventricular premature depolarization: Secondary | ICD-10-CM

## 2022-12-22 ENCOUNTER — Ambulatory Visit: Payer: 59

## 2023-01-03 ENCOUNTER — Ambulatory Visit: Payer: Self-pay

## 2023-01-03 NOTE — Telephone Encounter (Signed)
Chief Complaint: Cough Symptoms: Productive cough turned dry, moderate cough, body aches, sore throat, runny nose  Frequency: comes and goes  Pertinent Negatives: Patient denies fever, chest pain, nausea, vomiting Disposition: [] ED /[] Urgent Care (no appt availability in office) / [x] Appointment(In office/virtual)/ []  [facility]/ [] Home Care/ [] Refused Recommended Disposition /[] [facility]/ []  Follow-up with PCP Additional Notes: Patient stated she had a productive cough with body aches, sore throat, and runny nose that started on Thursday. Patient stated that the symptoms got better but she still has a dry cough and wheezing. Patient was given care advice and was scheduled for an appointment tomorrow. Patient advised to seek care at urgent care if symptoms get worse before appointment. Patient verbalized understanding. Reason for Disposition  [1] Continuous (nonstop) coughing interferes with work or school AND [2] no improvement using cough treatment per Care Advice  Answer Assessment - Initial Assessment Questions 1. ONSET: "When did the cough begin?"      Thursday evening 2. SEVERITY: "How bad is the cough today?"      Moderate 3. SPUTUM: "Describe the color of your sputum" (none, dry cough; clear, white, yellow, green)     I'm not sure  4. HEMOPTYSIS: "Are you coughing up any blood?" If so ask: "How much?" (flecks, streaks, tablespoons, etc.)     No  5. DIFFICULTY BREATHING: "Are you having difficulty breathing?" If Yes, ask: "How bad is it?" (e.g., mild, moderate, severe)    - MILD: No SOB at rest, mild SOB with walking, speaks normally in sentences, can lie down, no retractions, pulse < 100.    - MODERATE: SOB at rest, SOB with minimal exertion and prefers to sit, cannot lie down flat, speaks in phrases, mild retractions, audible wheezing, pulse 100-120.    - SEVERE: Very SOB at rest, speaks in single words, struggling to breathe, sitting hunched forward,  retractions, pulse > 120      Mild  6. FEVER: "Do you have a fever?" If Yes, ask: "What is your temperature, how was it measured, and when did it start?"     No 7. CARDIAC HISTORY: "Do you have any history of heart disease?" (e.g., heart attack, congestive heart failure)      No 8. LUNG HISTORY: "Do you have any history of lung disease?"  (e.g., pulmonary embolus, asthma, emphysema)     No  9. PE RISK FACTORS: "Do you have a history of blood clots?" (or: recent major surgery, recent prolonged travel, bedridden)     No  10. OTHER SYMPTOMS: "Do you have any other symptoms?" (e.g., runny nose, wheezing, chest pain)       Mild wheezing, sore throat, body aches 12. TRAVEL: "Have you traveled out of the country in the last month?" (e.g., travel history, exposures)       No  Protocols used: Cough - Acute Productive-A-AH

## 2023-01-04 ENCOUNTER — Ambulatory Visit (INDEPENDENT_AMBULATORY_CARE_PROVIDER_SITE_OTHER): Payer: 59 | Admitting: Internal Medicine

## 2023-01-04 ENCOUNTER — Encounter: Payer: Self-pay | Admitting: Internal Medicine

## 2023-01-04 VITALS — BP 110/76 | HR 89 | Temp 97.7°F | Ht 65.0 in | Wt 239.0 lb

## 2023-01-04 DIAGNOSIS — J40 Bronchitis, not specified as acute or chronic: Secondary | ICD-10-CM | POA: Diagnosis not present

## 2023-01-04 MED ORDER — ALBUTEROL SULFATE HFA 108 (90 BASE) MCG/ACT IN AERS
2.0000 | INHALATION_SPRAY | Freq: Four times a day (QID) | RESPIRATORY_TRACT | 0 refills | Status: AC | PRN
Start: 2023-01-04 — End: ?

## 2023-01-04 MED ORDER — DOXYCYCLINE HYCLATE 100 MG PO TABS
100.0000 mg | ORAL_TABLET | Freq: Two times a day (BID) | ORAL | 0 refills | Status: AC
Start: 2023-01-04 — End: 2023-01-14

## 2023-01-04 MED ORDER — PROMETHAZINE-DM 6.25-15 MG/5ML PO SYRP
5.0000 mL | ORAL_SOLUTION | Freq: Four times a day (QID) | ORAL | 0 refills | Status: AC | PRN
Start: 2023-01-04 — End: 2023-01-13

## 2023-01-04 MED ORDER — FLUCONAZOLE 100 MG PO TABS: 100.0000 mg | ORAL_TABLET | Freq: Every day | ORAL | 0 refills | Status: AC

## 2023-01-04 NOTE — Progress Notes (Signed)
Date:  01/04/2023   Name:  Carly Martin   DOB:  [birth year]   MRN:  [id number]   Chief Complaint: Cough (Started 5 days ago. Patient tested negative for Covid and Flu. Cough- productive at night but during the day its dry. SOB at night. Waking up at night coughing. )  Cough This is a new problem. The current episode started in the past 7 days. The problem has been unchanged. The problem occurs every few minutes. The cough is Non-productive. Associated symptoms include shortness of breath and wheezing. Pertinent negatives include no chest pain, chills or fever.    Review of Systems  Constitutional:  Negative for chills, fatigue and fever.  Respiratory:  Positive for cough, shortness of breath and wheezing. Negative for chest tightness.   Cardiovascular:  Negative for chest pain.  Gastrointestinal:  Negative for constipation and diarrhea.  Psychiatric/Behavioral:  Positive for sleep disturbance.      Lab Results  Component Value Date   NA 136 09/08/2021   K 3.6 09/08/2021   CO2 25 09/08/2021   GLUCOSE 74 09/08/2021   BUN 15 09/08/2021   CREATININE 0.76 09/08/2021   CALCIUM 9.2 09/08/2021   EGFR 101 07/22/2020   GFRNONAA >60 09/08/2021   Lab Results  Component Value Date   CHOL 176 07/22/2020   HDL 36 (L) 07/22/2020   LDLCALC 127 (H) 07/22/2020   TRIG 71 07/22/2020   CHOLHDL 4.9 (H) 07/22/2020   Lab Results  Component Value Date   TSH 1.390 07/22/2020   Lab Results  Component Value Date   HGBA1C 5.7 (H) 01/22/2021   Lab Results  Component Value Date   WBC 10.2 09/08/2021   HGB 12.3 09/08/2021   HCT 38.7 09/08/2021   MCV 89.6 09/08/2021   PLT 284 09/08/2021   Lab Results  Component Value Date   ALT 14 09/08/2021   AST 14 (L) 09/08/2021   ALKPHOS 49 09/08/2021   BILITOT 1.0 09/08/2021   No results found for: "25OHVITD2", "25OHVITD3", "VD25OH"   Patient Active Problem List   Diagnosis Date Noted   Multiple sclerosis (HCC) 04/29/2022   GERD without  esophagitis 07/22/2021   Nexplanon in place 02/27/2021   Obesity, Class II, BMI 35-39.9 08/06/2020   Mild hyperlipidemia 07/23/2020   Anxiety state 07/21/2020   Vitamin D deficiency 07/21/2020   Foot drop, right 08/15/2019   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   Irritable bowel syndrome with constipation 09/28/2016   Chronic tension-type headache, not intractable 09/03/2015   Sciatica of right side 03/17/2015   Iron deficiency anemia 03/17/2015   History of loop electrical excision procedure (LEEP) 09/28/2013    Allergies  Allergen Reactions   Codeine Shortness Of Breath    Past Surgical History:  Procedure Laterality Date   CERVICAL BIOPSY  W/ LOOP ELECTRODE EXCISION  2008   TONSILLECTOMY      Social History   Tobacco Use   Smoking status: Former    Current packs/day: 0.00    Average packs/day: 0.3 packs/day for 10.0 years (2.5 ttl pk-yrs)    Types: Cigarettes    Start date: 06/17/2010    Quit date: 06/16/2020    Years since quitting: 2.5   Smokeless tobacco: Never  Vaping Use   Vaping status: Never Used  Substance Use Topics   Alcohol use: Yes    Alcohol/week: 0.0 standard drinks of alcohol    Comment: socially   Drug use: No     Medication  list has been reviewed and updated.  Current Meds  Medication Sig   albuterol (VENTOLIN HFA) 108 (90 Base) MCG/ACT inhaler Inhale 2 puffs into the lungs every 6 (six) hours as needed for wheezing or shortness of breath.   diltiazem (CARDIZEM CD) 120 MG 24 hr capsule Take 1 capsule (120 mg total) by mouth in the morning and at bedtime.   doxycycline (VIBRA-TABS) 100 MG tablet Take 1 tablet (100 mg total) by mouth 2 (two) times daily for 10 days.   etonogestrel (NEXPLANON) 68 MG IMPL implant 1 each by Subdermal route once.   fluconazole (DIFLUCAN) 100 MG tablet Take 1 tablet (100 mg total) by mouth daily for 3 days.   omeprazole (PRILOSEC) 20 MG capsule Take 20 mg by mouth daily.    promethazine-dextromethorphan (PROMETHAZINE-DM) 6.25-15 MG/5ML syrup Take 5 mLs by mouth 4 (four) times daily as needed for up to 9 days for cough.   riTUXimab in sodium chloride 0.9 % 250 mL Inject 1,000 mg into the vein every 6 (six) months.   Vitamin D, Ergocalciferol, (DRISDOL) 1.25 MG (50000 UNIT) CAPS capsule Take 1 capsule by mouth once a week.       01/04/2023   10:24 AM 04/29/2022    4:08 PM 03/31/2021   11:21 AM 03/24/2021    9:02 AM  GAD 7 : Generalized Anxiety Score  Nervous, Anxious, on Edge 0 0 0 0  Control/stop worrying 0 0 0 0  Worry too much - different things 0 0 0 0  Trouble relaxing 0 0 0 0  Restless 0 0 0 0  Easily annoyed or irritable 0 0 0 0  Afraid - awful might happen 0 0 0 0  Total GAD 7 Score 0 0 0 0  Anxiety Difficulty Not difficult at all Not difficult at all         01/04/2023   10:24 AM 04/29/2022    4:07 PM 03/31/2021   11:20 AM  Depression screen PHQ 2/9  Decreased Interest 0 0 0  Down, Depressed, Hopeless 0 0 0  PHQ - 2 Score 0 0 0  Altered sleeping 0 0 0  Tired, decreased energy 0 0 0  Change in appetite 0 0 0  Feeling bad or failure about yourself  0 0 1  Trouble concentrating 0 0 0  Moving slowly or fidgety/restless 0 0 0  Suicidal thoughts 0 0 0  PHQ-9 Score 0 0 1  Difficult doing work/chores Not difficult at all Not difficult at all Not difficult at all    BP Readings from Last 3 Encounters:  01/04/23 110/76  04/29/22 118/72  04/28/22 120/76    Physical Exam Constitutional:      Appearance: Normal appearance.  Cardiovascular:     Rate and Rhythm: Normal rate and regular rhythm.  Pulmonary:     Effort: Pulmonary effort is normal.     Breath sounds: Normal breath sounds. No wheezing, rhonchi or rales.  Musculoskeletal:     Cervical back: Normal range of motion.  Lymphadenopathy:     Cervical: No cervical adenopathy.  Neurological:     Mental Status: She is alert.     Wt Readings from Last 3 Encounters:  01/04/23 239 lb  (108.4 kg)  04/29/22 238 lb (108 kg)  04/28/22 236 lb (107 kg)    BP 110/76   Pulse 89   Temp 97.7 F (36.5 C) (Oral)   Ht 5\' 5"  (1.651 m)   Wt 239 lb (108.4 kg)  SpO2 99%   BMI 39.77 kg/m   Assessment and Plan:  Problem List Items Addressed This Visit   None Visit Diagnoses     Bronchitis    -  Primary   neg home Covid/Flu likely other viral infection leading to lower resp infection   Relevant Medications   albuterol (VENTOLIN HFA) 108 (90 Base) MCG/ACT inhaler   doxycycline (VIBRA-TABS) 100 MG tablet   promethazine-dextromethorphan (PROMETHAZINE-DM) 6.25-15 MG/5ML syrup       No follow-ups on file.    Reubin Milan, MD [facility] [place]

## 2023-01-11 ENCOUNTER — Ambulatory Visit: Payer: 59 | Attending: Cardiology

## 2023-01-11 ENCOUNTER — Telehealth: Payer: Self-pay | Admitting: Cardiology

## 2023-01-11 NOTE — Telephone Encounter (Signed)
Pt was scheduled to have an ECHO done this morning but she called in let office know she hadn't been contact yet to let her know if it had been approved before coming to her appt. The appt was marked as a no show while pt was on the line so now pt would like to know if it can be removed as a no show due to her calling to check the status of the approval before coming in. She'd requesting a callback to f/u on this situation and to see how she should move forward. Please advise

## 2023-01-13 ENCOUNTER — Other Ambulatory Visit: Payer: Self-pay

## 2023-02-01 ENCOUNTER — Ambulatory Visit: Payer: 59 | Attending: Cardiology

## 2023-02-01 DIAGNOSIS — I493 Ventricular premature depolarization: Secondary | ICD-10-CM | POA: Diagnosis not present

## 2023-02-01 LAB — ECHOCARDIOGRAM COMPLETE
AR max vel: 3.1 cm2
AV Area VTI: 3.08 cm2
AV Area mean vel: 3.03 cm2
AV Mean grad: 3 mm[Hg]
AV Peak grad: 5.1 mm[Hg]
Ao pk vel: 1.13 m/s
Area-P 1/2: 4.57 cm2
Calc EF: 52.4 %
S' Lateral: 2.8 cm
Single Plane A2C EF: 53 %
Single Plane A4C EF: 53 %

## 2023-02-11 ENCOUNTER — Other Ambulatory Visit: Payer: Self-pay

## 2023-02-11 MED ORDER — LORAZEPAM 0.5 MG PO TABS: 0.5000 mg | ORAL_TABLET | Freq: Once | ORAL | 0 refills | Status: AC

## 2023-02-11 NOTE — Progress Notes (Signed)
 Lanier Prude, MD  Frutoso Schatz, RN OK to give her: Lorazepam 0.5mg  by mouth once 45 minutes prior to the MRI. If you write the script, I can sign it at our next office visit.  Thanks, Steffanie Dunn

## 2023-03-01 ENCOUNTER — Encounter: Payer: Self-pay | Admitting: Cardiology

## 2023-03-01 ENCOUNTER — Ambulatory Visit: Payer: 59 | Attending: Cardiology | Admitting: Cardiology

## 2023-03-01 VITALS — BP 122/80 | HR 75 | Ht 65.0 in | Wt 237.6 lb

## 2023-03-01 DIAGNOSIS — I493 Ventricular premature depolarization: Secondary | ICD-10-CM

## 2023-03-01 DIAGNOSIS — Z6839 Body mass index (BMI) 39.0-39.9, adult: Secondary | ICD-10-CM

## 2023-03-01 DIAGNOSIS — E7800 Pure hypercholesterolemia, unspecified: Secondary | ICD-10-CM

## 2023-03-01 DIAGNOSIS — H8110 Benign paroxysmal vertigo, unspecified ear: Secondary | ICD-10-CM

## 2023-03-01 MED ORDER — METOPROLOL SUCCINATE ER 25 MG PO TB24: 25.0000 mg | ORAL_TABLET | Freq: Every day | ORAL | 3 refills | Status: DC

## 2023-03-01 NOTE — Patient Instructions (Signed)
Medication Instructions:   START Metoprolol Succinate - Take one tablet (25mg ) by mouth daily.   *If you need a refill on your cardiac medications before your next appointment, please call your pharmacy*   Lab Work:  None Ordered  If you have labs (blood work) drawn today and your tests are completely normal, you will receive your results only by: MyChart Message (if you have MyChart) OR A paper copy in the mail If you have any lab test that is abnormal or we need to change your treatment, we will call you to review the results.   Testing/Procedures:  None Ordered   Follow-Up: At [facility], you and your health needs are our priority.  As part of our continuing mission to provide you with exceptional heart care, we have created designated Provider Care Teams.  These Care Teams include your primary Cardiologist (physician) and Advanced Practice Providers (APPs -  Physician Assistants and Nurse Practitioners) who all work together to provide you with the care you need, when you need it.  We recommend signing up for the patient portal called "MyChart".  Sign up information is provided on this After Visit Summary.  MyChart is used to connect with patients for Virtual Visits (Telemedicine).  Patients are able to view lab/test results, encounter notes, upcoming appointments, etc.  Non-urgent messages can be sent to your provider as well.   To learn more about what you can do with MyChart, go to [email or web address].    Your next appointment:   3 month(s)  Provider:   You may see Debbe Odea, MD or one of the following Advanced Practice Providers on your designated Care Team:   Nicolasa Ducking, NP Eula Listen, PA-C Cadence Fransico Michael, PA-C Charlsie Quest, NP Carlos Levering, NP

## 2023-03-01 NOTE — Progress Notes (Signed)
Cardiology Office Note:    Date:  03/01/2023   ID:  Carly Martin, DOB [birth year], MRN [id number]  PCP:  Reubin Milan, MD   [facility] Providers Cardiologist:  Debbe Odea, MD Electrophysiologist:  Lanier Prude, MD     Referring MD: Reubin Milan, MD   Chief Complaint  Patient presents with   Follow-up    Patient concerned of positional dizziness for past month.    History of Present Illness:    Carly Martin is a 38 y.o. female with a hx of multiple sclerosis, former smoker x15 years, frequent PVCs who presents for follow-up.  Previously seen for frequent PVCs.  Evaluated by EP, started on Cardizem with improvement in symptoms of palpitations.  Over the past 6 to 8 weeks, she has noticed dizziness with changing positions such as moving from sitting to standing, bending over to tie her shoelace.  Taking Cardizem makes symptoms worse.  She stopped a week ago with improvement in dizziness although still present.  Scheduled for CMR next month to evaluate infiltrative disease.  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  Prior notes Echo 01/2023 EF 60 to 65% Echo 12/2021 EF 55 to 60%   Past Medical History:  Diagnosis Date   Asthma     Past Surgical History:  Procedure Laterality Date   CERVICAL BIOPSY  W/ LOOP ELECTRODE EXCISION  2008   TONSILLECTOMY      Current Medications: Current Meds  Medication Sig   albuterol (VENTOLIN HFA) 108 (90 Base) MCG/ACT inhaler Inhale 2 puffs into the lungs every 6 (six) hours as needed for wheezing or shortness of breath.   etonogestrel (NEXPLANON) 68 MG IMPL implant 1 each by Subdermal route once.   metoprolol succinate (TOPROL XL) 25 MG 24 hr tablet Take 1 tablet (25 mg total) by mouth daily.   omeprazole (PRILOSEC) 20 MG capsule Take 20 mg by mouth daily.   riTUXimab in sodium chloride 0.9 % 250 mL Inject 1,000 mg into the vein every 6 (six) months.   Vitamin D, Ergocalciferol,  (DRISDOL) 1.25 MG (50000 UNIT) CAPS capsule Take 1 capsule by mouth once a week.     Allergies:   Codeine   Social History   Socioeconomic History   Marital status: Married    Spouse name: Not on file   Number of children: Not on file   Years of education: Not on file   Highest education level: Not on file  Occupational History   Not on file  Tobacco Use   Smoking status: Former    Current packs/day: 0.00    Average packs/day: 0.3 packs/day for 10.0 years (2.5 ttl pk-yrs)    Types: Cigarettes    Start date: 06/17/2010    Quit date: 06/16/2020    Years since quitting: 2.7   Smokeless tobacco: Never  Vaping Use   Vaping status: Never Used  Substance and Sexual Activity   Alcohol use: Yes    Alcohol/week: 0.0 standard drinks of alcohol    Comment: socially   Drug use: No   Sexual activity: Yes  Other Topics Concern   Not on file  Social History Narrative   Not on file   Social Drivers of Health   Financial Resource Strain: Low Risk  (07/02/2022)   Received from [facility], [facility]   Overall Financial Resource Strain (CARDIA)    Difficulty of Paying Living Expenses: Not hard  at all  Food Insecurity: No Food Insecurity (07/02/2022)   Received from [facility], [facility]   Hunger Vital Sign    Worried About Running Out of Food in the Last Year: Never true    Ran Out of Food in the Last Year: Never true  Transportation Needs: No Transportation Needs (07/02/2022)   Received from [facility], [facility]   [facility] - Transportation    In the past 12 months, has lack of transportation kept you from medical appointments or from getting medications?: No    Lack of Transportation (Non-Medical): No  Physical Activity: Inactive (07/02/2022)   Received from [facility], [facility]   Exercise Vital Sign    Days of Exercise per Week: 0  days    Minutes of Exercise per Session: 0 min  Stress: No Stress Concern Present (07/02/2022)   Received from [facility], [facility]   [employer] of Occupational [facility] - Occupational Stress Questionnaire    Feeling of Stress : Not at all  Social Connections: Moderately Isolated (07/02/2022)   Received from [facility], [facility]   Social Connection and Isolation Panel [NHANES]    Frequency of Communication with Friends and Family: More than three times a week    Frequency of Social Gatherings with Friends and Family: More than three times a week    Attends Religious Services: Never    Database administrator or Organizations: No    Attends Engineer, structural: Never    Marital Status: Married     Family History: The patient's family history includes Cancer in her maternal grandfather and paternal grandfather; Migraines in her sister.  ROS:   Please see the history of present illness.     All other systems reviewed and are negative.  EKGs/Labs/Other Studies Reviewed:    The following studies were reviewed today:   EKG Interpretation Date/Time:  Tuesday March 01 2023 08:15:32 EST Ventricular Rate:  75 PR Interval:  186 QRS Duration:  68 QT Interval:  372 QTC Calculation: 415 R Axis:   61  Text Interpretation: Normal sinus rhythm Normal ECG Confirmed by Debbe Odea ([id number]) on 03/01/2023 8:17:37 AM    Recent Labs: No results found for requested labs within last 365 days.  Recent Lipid Panel    Component Value Date/Time   CHOL 176 07/22/2020 0824   TRIG 71 07/22/2020 0824   HDL 36 (L) 07/22/2020 0824   CHOLHDL 4.9 (H) 07/22/2020 0824   LDLCALC 127 (H) 07/22/2020 0824     Risk Assessment/Calculations:             Physical Exam:    VS:  BP 122/80 (BP Location: Left Arm, Patient Position: Sitting, Cuff Size: Large)   Pulse 75   Ht 5\' 5"  (1.651 m)   Wt 237 lb 9.6  oz (107.8 kg)   SpO2 98%   BMI 39.54 kg/m     Wt Readings from Last 3 Encounters:  03/01/23 237 lb 9.6 oz (107.8 kg)  01/04/23 239 lb (108.4 kg)  04/29/22 238 lb (108 kg)     GEN:  Well nourished, well developed in no acute distress HEENT: Normal NECK: No JVD; No carotid bruits CARDIAC: RRR, no murmurs, rubs, gallops RESPIRATORY:  Clear to auscultation without rales, wheezing or rhonchi  ABDOMEN: Soft, non-tender, non-distended MUSCULOSKELETAL:  No edema; No deformity  SKIN: Warm  and dry NEUROLOGIC:  Alert and oriented x 3 PSYCHIATRIC:  Normal affect   ASSESSMENT:    1. Benign paroxysmal positional vertigo, unspecified laterality   2. Frequent PVCs   3. Pure hypercholesterolemia   4. BMI 39.0-39.9,adult    PLAN:    In order of problems listed above:  Dizziness with changing positions consistent with positional vertigo.  Orthostatics vitals today with no evidence for orthostasis.  Recommend follow-up with PCP, consider Antivert.  Consider ENT referral if symptoms persist. Frequent PVCs, 10/23 cardiac monitor with 21.5% burden.  Echo 11/23 EF 55 to 60%.  Repeat echo 01/2023 EF 60 to 65%.  EKG today showing normal sinus rhythm.  No PVCs.  Did not tolerate Cardizem.  Start Toprol-XL 25 mg daily.  PVCs etiology likely from rituximab for treatment of multiple sclerosis.  Appreciate input from EP.  CMR scheduled for next month.  Also evaluate aortic arch due to history of right-sided arch. Hyperlipidemia, not in statin benefit group, continue low-cholesterol diet. Obesity, low-calorie diet.  Follow-up in 3 months.     Medication Adjustments/Labs and Tests Ordered: Current medicines are reviewed at length with the patient today.  Concerns regarding medicines are outlined above.  Orders Placed This Encounter  Procedures   EKG 12-Lead   Meds ordered this encounter  Medications   metoprolol succinate (TOPROL XL) 25 MG 24 hr tablet    Sig: Take 1 tablet (25 mg total) by mouth  daily.    Dispense:  90 tablet    Refill:  3    Patient Instructions  Medication Instructions:   START Metoprolol Succinate - Take one tablet ( 25mg ) by mouth daily.   *If you need a refill on your cardiac medications before your next appointment, please call your pharmacy*   Lab Work:  None Ordered  If you have labs (blood work) drawn today and your tests are completely normal, you will receive your results only by: MyChart Message (if you have MyChart) OR A paper copy in the mail If you have any lab test that is abnormal or we need to change your treatment, we will call you to review the results.   Testing/Procedures:  None Ordered    Follow-Up: At [facility], you and your health needs are our priority.  As part of our continuing mission to provide you with exceptional heart care, we have created designated Provider Care Teams.  These Care Teams include your primary Cardiologist (physician) and Advanced Practice Providers (APPs -  Physician Assistants and Nurse Practitioners) who all work together to provide you with the care you need, when you need it.  We recommend signing up for the patient portal called "MyChart".  Sign up information is provided on this After Visit Summary.  MyChart is used to connect with patients for Virtual Visits (Telemedicine).  Patients are able to view lab/test results, encounter notes, upcoming appointments, etc.  Non-urgent messages can be sent to your provider as well.   To learn more about what you can do with MyChart, go to [email or web address].    Your next appointment:   3 month(s)  Provider:   You may see Debbe Odea, MD or one of the following Advanced Practice Providers on your designated Care Team:   Nicolasa Ducking, NP Eula Listen, PA-C Cadence Fransico Michael, PA-C Charlsie Quest, NP Carlos Levering, NP     Signed, Debbe Odea, MD  03/01/2023 9:36 AM    [facility]

## 2023-03-04 ENCOUNTER — Encounter: Payer: Self-pay | Admitting: Cardiology

## 2023-03-08 ENCOUNTER — Other Ambulatory Visit: Payer: 59

## 2023-03-09 ENCOUNTER — Other Ambulatory Visit: Payer: 59

## 2023-03-09 LAB — CBC AND DIFFERENTIAL
HCT: 37 (ref 36–46)
Hemoglobin: 12 (ref 12.0–16.0)
Platelets: 315 10*3/uL (ref 150–400)
WBC: 9.9

## 2023-03-09 LAB — VITAMIN D 25 HYDROXY (VIT D DEFICIENCY, FRACTURES): Vit D, 25-Hydroxy: 17.6

## 2023-03-23 ENCOUNTER — Telehealth: Payer: Self-pay

## 2023-03-23 ENCOUNTER — Ambulatory Visit (INDEPENDENT_AMBULATORY_CARE_PROVIDER_SITE_OTHER): Payer: 59 | Admitting: Internal Medicine

## 2023-03-23 ENCOUNTER — Encounter: Payer: Self-pay | Admitting: Internal Medicine

## 2023-03-23 VITALS — BP 122/78 | HR 78 | Ht 65.0 in | Wt 237.0 lb

## 2023-03-23 DIAGNOSIS — L2084 Intrinsic (allergic) eczema: Secondary | ICD-10-CM | POA: Diagnosis not present

## 2023-03-23 DIAGNOSIS — I493 Ventricular premature depolarization: Secondary | ICD-10-CM | POA: Diagnosis not present

## 2023-03-23 DIAGNOSIS — K219 Gastro-esophageal reflux disease without esophagitis: Secondary | ICD-10-CM | POA: Diagnosis not present

## 2023-03-23 DIAGNOSIS — E785 Hyperlipidemia, unspecified: Secondary | ICD-10-CM

## 2023-03-23 DIAGNOSIS — E66812 Obesity, class 2: Secondary | ICD-10-CM | POA: Diagnosis not present

## 2023-03-23 DIAGNOSIS — E559 Vitamin D deficiency, unspecified: Secondary | ICD-10-CM | POA: Diagnosis not present

## 2023-03-23 DIAGNOSIS — R7303 Prediabetes: Secondary | ICD-10-CM | POA: Diagnosis not present

## 2023-03-23 DIAGNOSIS — Z23 Encounter for immunization: Secondary | ICD-10-CM

## 2023-03-23 DIAGNOSIS — Z0000 Encounter for general adult medical examination without abnormal findings: Secondary | ICD-10-CM

## 2023-03-23 DIAGNOSIS — Z975 Presence of (intrauterine) contraceptive device: Secondary | ICD-10-CM

## 2023-03-23 DIAGNOSIS — G3781 Myelin oligodendrocyte glycoprotein antibody disease: Secondary | ICD-10-CM

## 2023-03-23 MED ORDER — TRIAMCINOLONE ACETONIDE 0.1 % EX CREA
1.0000 | TOPICAL_CREAM | Freq: Two times a day (BID) | CUTANEOUS | 0 refills | Status: AC
Start: 2023-03-23 — End: ?

## 2023-03-23 MED ORDER — WEGOVY 0.25 MG/0.5ML ~~LOC~~ SOAJ
0.2500 mg | SUBCUTANEOUS | 0 refills | Status: DC
Start: 2023-03-23 — End: 2023-10-28

## 2023-03-23 NOTE — Assessment & Plan Note (Signed)
Previously on diltiazem bid but had vague side effects Cardiology changed to Toprol XL but she is concerned about taking I recommend taking Toprol XL at bedtime - expect it to be better tolerated

## 2023-03-23 NOTE — Assessment & Plan Note (Signed)
Managed with diet only. Lab Results  Component Value Date   LDLCALC 127 (H) 07/22/2020

## 2023-03-23 NOTE — Assessment & Plan Note (Signed)
She would like to take [facility] if covered Will send in and do PA if needed If not covered, she plans to try an online program that may be affordable at $180/mo

## 2023-03-23 NOTE — Assessment & Plan Note (Signed)
Managed with diet. Lab Results  Component Value Date   HGBA1C 5.7 (H) 01/22/2021

## 2023-03-23 NOTE — Assessment & Plan Note (Signed)
>>  ASSESSMENT AND PLAN FOR OBESITY, CLASS II, BMI 35-39.9 WRITTEN ON 03/23/2023 12:23 PM BY Kemari Mares, LEITA DEL, MD  She would like to take [employer]  if covered Will send in and do PA if needed If not covered, she plans to try an online program that may be affordable at $180/mo

## 2023-03-23 NOTE — Assessment & Plan Note (Addendum)
Recent level very low. Encourage consistent supplementation - now on weekly

## 2023-03-23 NOTE — Progress Notes (Signed)
Date:  03/23/2023   Name:  Carly Martin   DOB:  1985-12-15   MRN:  [id number]   Chief Complaint: Annual Exam Carly Martin is a 38 y.o. female who presents today for her Complete Annual Exam. She feels well. She reports exercising - none. She reports she is sleeping fairly well. Breast complaints - none.  Health Maintenance  Topic Date Due   COVID-19 Vaccine (4 - 2024-25 season) 10/03/2022   DTaP/Tdap/Td vaccine (2 - Td or Tdap) 03/18/2024   Pap with HPV screening  07/02/2027   Flu Shot  Completed   Hepatitis C Screening  Completed   HIV Screening  Completed   HPV Vaccine  Aged Out    HPI  Review of Systems  Constitutional:  Negative for chills, fatigue and fever.  HENT:  Negative for trouble swallowing.   Eyes:  Negative for visual disturbance.  Respiratory:  Negative for chest tightness and shortness of breath.   Cardiovascular:  Positive for palpitations. Negative for chest pain and leg swelling.  Gastrointestinal:  Negative for abdominal pain, constipation, nausea and vomiting.  Genitourinary:  Negative for dysuria, hematuria and menstrual problem.  Neurological:  Negative for dizziness and headaches.  Hematological:  Negative for adenopathy.  Psychiatric/Behavioral:  Negative for dysphoric mood and sleep disturbance. The patient is not nervous/anxious.      Lab Results  Component Value Date   NA 136 09/08/2021   K 3.6 09/08/2021   CO2 25 09/08/2021   GLUCOSE 74 09/08/2021   BUN 15 09/08/2021   CREATININE 0.76 09/08/2021   CALCIUM 9.2 09/08/2021   EGFR 101 07/22/2020   GFRNONAA >60 09/08/2021   Lab Results  Component Value Date   CHOL 176 07/22/2020   HDL 36 (L) 07/22/2020   LDLCALC 127 (H) 07/22/2020   TRIG 71 07/22/2020   CHOLHDL 4.9 (H) 07/22/2020   Lab Results  Component Value Date   TSH 1.390 07/22/2020   Lab Results  Component Value Date   HGBA1C 5.7 (H) 01/22/2021   Lab Results  Component Value Date   WBC 9.9 03/09/2023   HGB  12.0 03/09/2023   HCT 37 03/09/2023   MCV 89.6 09/08/2021   PLT 315 03/09/2023   Lab Results  Component Value Date   ALT 14 09/08/2021   AST 14 (L) 09/08/2021   ALKPHOS 49 09/08/2021   BILITOT 1.0 09/08/2021   Lab Results  Component Value Date   VD25OH 17.6 03/09/2023     Patient Active Problem List   Diagnosis Date Noted   Prediabetes 03/23/2023   PVC's (premature ventricular contractions) 03/23/2023   Intrinsic eczema 03/23/2023   Multiple sclerosis (HCC) 04/29/2022   GERD without esophagitis 07/22/2021   Nexplanon in place 02/27/2021   Obesity, Class II, BMI 35-39.9 08/06/2020   Mild hyperlipidemia 07/23/2020   Anxiety state 07/21/2020   Vitamin D deficiency 07/21/2020   Foot drop, right 08/15/2019   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   Irritable bowel syndrome with constipation 09/28/2016   Chronic tension-type headache, not intractable 09/03/2015   Sciatica of right side 03/17/2015   Iron deficiency anemia 03/17/2015   History of loop electrical excision procedure (LEEP) 09/28/2013    Allergies  Allergen Reactions   Codeine Shortness Of Breath    Past Surgical History:  Procedure Laterality Date   CERVICAL BIOPSY  W/ LOOP ELECTRODE EXCISION  2008   TONSILLECTOMY      Social History   Tobacco Use  Smoking status: Former    Current packs/day: 0.00    Average packs/day: 0.3 packs/day for 10.0 years (2.5 ttl pk-yrs)    Types: Cigarettes    Start date: 06/17/2010    Quit date: 06/16/2020    Years since quitting: 2.7   Smokeless tobacco: Never  Vaping Use   Vaping status: Never Used  Substance Use Topics   Alcohol use: Yes    Alcohol/week: 0.0 standard drinks of alcohol    Comment: socially   Drug use: No     Medication list has been reviewed and updated.  Current Meds  Medication Sig   albuterol (VENTOLIN HFA) 108 (90 Base) MCG/ACT inhaler Inhale 2 puffs into the lungs every 6 (six) hours as needed  for wheezing or shortness of breath.   etonogestrel (NEXPLANON) 68 MG IMPL implant 1 each by Subdermal route once.   metoprolol succinate (TOPROL XL) 25 MG 24 hr tablet Take 1 tablet (25 mg total) by mouth daily.   omeprazole (PRILOSEC) 20 MG capsule Take 20 mg by mouth daily.   riTUXimab in sodium chloride 0.9 % 250 mL Inject 1,000 mg into the vein every 6 (six) months.   Semaglutide-Weight Management (WEGOVY) 0.25 MG/0.5ML SOAJ Inject 0.25 mg into the skin once a week.   triamcinolone cream (KENALOG) 0.1 % Apply 1 Application topically 2 (two) times daily. To eczema rash on abdomen and back   Vitamin D, Ergocalciferol, (DRISDOL) 1.25 MG (50000 UNIT) CAPS capsule Take 1 capsule by mouth once a week.       03/23/2023   11:07 AM 01/04/2023   10:24 AM 04/29/2022    4:08 PM 03/31/2021   11:21 AM  GAD 7 : Generalized Anxiety Score  Nervous, Anxious, on Edge 0 0 0 0  Control/stop worrying 0 0 0 0  Worry too much - different things 0 0 0 0  Trouble relaxing 0 0 0 0  Restless 0 0 0 0  Easily annoyed or irritable 0 0 0 0  Afraid - awful might happen 0 0 0 0  Total GAD 7 Score 0 0 0 0  Anxiety Difficulty Not difficult at all Not difficult at all Not difficult at all        03/23/2023   11:06 AM 01/04/2023   10:24 AM 04/29/2022    4:07 PM  Depression screen PHQ 2/9  Decreased Interest 0 0 0  Down, Depressed, Hopeless 0 0 0  PHQ - 2 Score 0 0 0  Altered sleeping 1 0 0  Tired, decreased energy 1 0 0  Change in appetite 0 0 0  Feeling bad or failure about yourself  0 0 0  Trouble concentrating 1 0 0  Moving slowly or fidgety/restless 0 0 0  Suicidal thoughts 0 0 0  PHQ-9 Score 3 0 0  Difficult doing work/chores Not difficult at all Not difficult at all Not difficult at all    BP Readings from Last 3 Encounters:  03/23/23 122/78  03/01/23 122/80  01/04/23 110/76    Physical Exam Vitals and nursing note reviewed.  Constitutional:      General: She is not in acute distress.     Appearance: Normal appearance. She is well-developed.  HENT:     Head: Normocephalic and atraumatic.  Cardiovascular:     Rate and Rhythm: Normal rate and regular rhythm.  Pulmonary:     Effort: Pulmonary effort is normal. No respiratory distress.  Musculoskeletal:     Cervical back: Normal range of  motion.     Right lower leg: No edema.     Left lower leg: No edema.  Lymphadenopathy:     Cervical: No cervical adenopathy.  Skin:    General: Skin is warm and dry.     Findings: No rash.          Comments: Dry hyperpigmented patches  Neurological:     General: No focal deficit present.     Mental Status: She is alert and oriented to person, place, and time.  Psychiatric:        Mood and Affect: Mood normal.        Behavior: Behavior normal.     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      Vitamin D deficiency (Chronic)   Recent level very low. Encourage consistent supplementation - now on weekly      Mild hyperlipidemia (Chronic)   Managed with diet only. Lab Results  Component Value Date   LDLCALC 127 (H) 07/22/2020         Relevant Orders   Lipid panel   Obesity, Class II, BMI 35-39.9   She would like to take [facility] if covered Will send in and do PA if needed If not covered, she plans to try an online program that may be affordable at $180/mo      Relevant Medications   Semaglutide-Weight Management (WEGOVY) 0.25 MG/0.5ML SOAJ   GERD without esophagitis (Chronic)   Reflux symptoms are minimal on current therapy - omeprazole. No red flag signs such  as weight loss, n/v, melena       Nexplanon in place   Recently replaced and doing well Some occasional spotting but no other concerns      Prediabetes   Managed with diet. Lab Results  Component Value Date   HGBA1C 5.7 (H) 01/22/2021         Relevant Orders   Comprehensive metabolic panel   Hemoglobin A1c   PVC's (premature ventricular contractions)   Previously on diltiazem bid but had vague side effects Cardiology changed to Toprol XL but she is concerned about taking I recommend taking Toprol XL at bedtime - expect it to be better tolerated      Intrinsic eczema   Relevant Medications   triamcinolone cream (KENALOG) 0.1 %   Other Visit Diagnoses       Annual physical exam    -  Primary   Relevant Orders   Comprehensive metabolic panel   Hemoglobin A1c   Lipid panel   TSH     Immunization due       schedule Prevnar-20 in about one month (6 weeks post infusion)       No follow-ups on file.    Reubin Milan, MD [facility] [place]

## 2023-03-23 NOTE — Assessment & Plan Note (Signed)
Recently replaced and doing well Some occasional spotting but no other concerns

## 2023-03-23 NOTE — Telephone Encounter (Signed)
Completed PA for Wegovy 0.25.  ([employer] Hughs) QMVHQI 0.25MG /0.5ML auto-injectors Form [employer] Electronic PA Form (2017 NCPDP)  Medication is "excluded from patients benefit plan."

## 2023-03-23 NOTE — Assessment & Plan Note (Signed)
 Reflux symptoms are minimal on current therapy - omeprazole. No red flag signs such as weight loss, n/v, melena

## 2023-03-23 NOTE — Assessment & Plan Note (Signed)
Receiving infusion every 6 months and doing well.

## 2023-03-24 ENCOUNTER — Encounter: Payer: Self-pay | Admitting: Internal Medicine

## 2023-03-24 LAB — COMPREHENSIVE METABOLIC PANEL
ALT: 12 [IU]/L (ref 0–32)
AST: 14 [IU]/L (ref 0–40)
Albumin: 4.5 g/dL (ref 3.9–4.9)
Alkaline Phosphatase: 69 [IU]/L (ref 44–121)
BUN/Creatinine Ratio: 13 (ref 9–23)
BUN: 11 mg/dL (ref 6–20)
Bilirubin Total: 0.8 mg/dL (ref 0.0–1.2)
CO2: 23 mmol/L (ref 20–29)
Calcium: 9.9 mg/dL (ref 8.7–10.2)
Chloride: 101 mmol/L (ref 96–106)
Creatinine, Ser: 0.82 mg/dL (ref 0.57–1.00)
Globulin, Total: 3.1 g/dL (ref 1.5–4.5)
Glucose: 93 mg/dL (ref 70–99)
Potassium: 4.7 mmol/L (ref 3.5–5.2)
Sodium: 138 mmol/L (ref 134–144)
Total Protein: 7.6 g/dL (ref 6.0–8.5)
eGFR: 94 mL/min/{1.73_m2} (ref >59)

## 2023-03-24 LAB — HEMOGLOBIN A1C
Est. average glucose Bld gHb Est-mCnc: 120 mg/dL
Hgb A1c MFr Bld: 5.8 % — ABNORMAL HIGH (ref 4.8–5.6)

## 2023-03-24 LAB — LIPID PANEL
Chol/HDL Ratio: 5.3 {ratio} — ABNORMAL HIGH (ref 0.0–4.4)
Cholesterol, Total: 207 mg/dL — ABNORMAL HIGH (ref 100–199)
HDL: 39 mg/dL — ABNORMAL LOW (ref >39)
LDL Chol Calc (NIH): 155 mg/dL — ABNORMAL HIGH (ref 0–99)
Triglycerides: 73 mg/dL (ref 0–149)
VLDL Cholesterol Cal: 13 mg/dL (ref 5–40)

## 2023-03-24 LAB — TSH: TSH: 1.52 u[IU]/mL (ref 0.450–4.500)

## 2023-04-08 ENCOUNTER — Ambulatory Visit: Admitting: Internal Medicine

## 2023-04-08 ENCOUNTER — Encounter: Payer: Self-pay | Admitting: Internal Medicine

## 2023-04-08 VITALS — BP 132/78 | HR 95 | Temp 98.4°F | Ht 65.0 in | Wt 237.4 lb

## 2023-04-08 DIAGNOSIS — B370 Candidal stomatitis: Secondary | ICD-10-CM | POA: Diagnosis not present

## 2023-04-08 DIAGNOSIS — J069 Acute upper respiratory infection, unspecified: Secondary | ICD-10-CM | POA: Diagnosis not present

## 2023-04-08 MED ORDER — FLUCONAZOLE 100 MG PO TABS
100.0000 mg | ORAL_TABLET | ORAL | 0 refills | Status: AC
Start: 2023-04-08 — End: 2023-04-13

## 2023-04-08 MED ORDER — NYSTATIN 100000 UNIT/ML MT SUSP
5.0000 mL | Freq: Four times a day (QID) | OROMUCOSAL | 0 refills | Status: DC
Start: 2023-04-08 — End: 2023-06-01

## 2023-04-08 NOTE — Progress Notes (Signed)
 Date:  04/08/2023   Name:  Carly Martin   DOB:  25-Nov-1985   MRN:  [id number]   Chief Complaint: Oral Swelling (Patient said her tongue is on fire and swollen, woke up Wednesday morning started to notice facial pain)  Sore Throat  This is a new problem. Episode onset: 2 days ago. The problem has been gradually improving. Neither side of throat is experiencing more pain than the other. There has been no fever. Associated symptoms include a plugged ear sensation, swollen glands and trouble swallowing. Pertinent negatives include no headaches, hoarse voice or shortness of breath. Associated symptoms comments: And tongue pain. She has had no exposure to strep or mono. She has tried acetaminophen for the symptoms. The treatment provided mild relief.    Review of Systems  Constitutional:  Negative for chills, fatigue and fever.  HENT:  Positive for dental problem (tongue pain) and trouble swallowing. Negative for hoarse voice and nosebleeds.   Respiratory:  Negative for chest tightness and shortness of breath.   Cardiovascular:  Negative for chest pain and palpitations.  Neurological:  Negative for headaches.  Psychiatric/Behavioral:  Negative for dysphoric mood and sleep disturbance. The patient is not nervous/anxious.      Lab Results  Component Value Date   NA 138 03/23/2023   K 4.7 03/23/2023   CO2 23 03/23/2023   GLUCOSE 93 03/23/2023   BUN 11 03/23/2023   CREATININE 0.82 03/23/2023   CALCIUM 9.9 03/23/2023   EGFR 94 03/23/2023   GFRNONAA >60 09/08/2021   Lab Results  Component Value Date   CHOL 207 (H) 03/23/2023   HDL 39 (L) 03/23/2023   LDLCALC 155 (H) 03/23/2023   TRIG 73 03/23/2023   CHOLHDL 5.3 (H) 03/23/2023   Lab Results  Component Value Date   TSH 1.520 03/23/2023   Lab Results  Component Value Date   HGBA1C 5.8 (H) 03/23/2023   Lab Results  Component Value Date   WBC 9.9 03/09/2023   HGB 12.0 03/09/2023   HCT 37 03/09/2023   MCV 89.6 09/08/2021    PLT 315 03/09/2023   Lab Results  Component Value Date   ALT 12 03/23/2023   AST 14 03/23/2023   ALKPHOS 69 03/23/2023   BILITOT 0.8 03/23/2023   Lab Results  Component Value Date   VD25OH 17.6 03/09/2023     Patient Active Problem List   Diagnosis Date Noted   Prediabetes 03/23/2023   PVC's (premature ventricular contractions) 03/23/2023   Intrinsic eczema 03/23/2023   Multiple sclerosis (HCC) 04/29/2022   GERD without esophagitis 07/22/2021   Nexplanon in place 02/27/2021   Obesity, Class II, BMI 35-39.9 08/06/2020   Mild hyperlipidemia 07/23/2020   Anxiety state 07/21/2020   Vitamin D deficiency 07/21/2020   Foot drop, right 08/15/2019   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   Irritable bowel syndrome with constipation 09/28/2016   Chronic tension-type headache, not intractable 09/03/2015   Sciatica of right side 03/17/2015   Iron deficiency anemia 03/17/2015   History of loop electrical excision procedure (LEEP) 09/28/2013    Allergies  Allergen Reactions   Codeine Shortness Of Breath    Past Surgical History:  Procedure Laterality Date   CERVICAL BIOPSY  W/ LOOP ELECTRODE EXCISION  2008   TONSILLECTOMY      Social History   Tobacco Use   Smoking status: Former    Current packs/day: 0.00    Average packs/day: 0.3 packs/day for 10.0 years (2.5 ttl  pk-yrs)    Types: Cigarettes    Start date: 06/17/2010    Quit date: 06/16/2020    Years since quitting: 2.8   Smokeless tobacco: Never  Vaping Use   Vaping status: Never Used  Substance Use Topics   Alcohol use: Yes    Alcohol/week: 0.0 standard drinks of alcohol    Comment: socially   Drug use: No     Medication list has been reviewed and updated.  Current Meds  Medication Sig   albuterol (VENTOLIN HFA) 108 (90 Base) MCG/ACT inhaler Inhale 2 puffs into the lungs every 6 (six) hours as needed for wheezing or shortness of breath.   etonogestrel (NEXPLANON)  68 MG IMPL implant 1 each by Subdermal route once.   fluconazole (DIFLUCAN) 100 MG tablet Take 1 tablet (100 mg total) by mouth every other day for 5 days.   fluconazole (DIFLUCAN) 150 MG tablet Take 150 mg by mouth once.   nystatin (MYCOSTATIN) 100000 UNIT/ML suspension Take 5 mLs (500,000 Units total) by mouth 4 (four) times daily.   omeprazole (PRILOSEC) 20 MG capsule Take 20 mg by mouth daily.   riTUXimab in sodium chloride 0.9 % 250 mL Inject 1,000 mg into the vein every 6 (six) months.   triamcinolone cream (KENALOG) 0.1 % Apply 1 Application topically 2 (two) times daily. To eczema rash on abdomen and back   Vitamin D, Ergocalciferol, (DRISDOL) 1.25 MG (50000 UNIT) CAPS capsule Take 1 capsule by mouth once a week.       03/23/2023   11:07 AM 01/04/2023   10:24 AM 04/29/2022    4:08 PM 03/31/2021   11:21 AM  GAD 7 : Generalized Anxiety Score  Nervous, Anxious, on Edge 0 0 0 0  Control/stop worrying 0 0 0 0  Worry too much - different things 0 0 0 0  Trouble relaxing 0 0 0 0  Restless 0 0 0 0  Easily annoyed or irritable 0 0 0 0  Afraid - awful might happen 0 0 0 0  Total GAD 7 Score 0 0 0 0  Anxiety Difficulty Not difficult at all Not difficult at all Not difficult at all        03/23/2023   11:06 AM 01/04/2023   10:24 AM 04/29/2022    4:07 PM  Depression screen PHQ 2/9  Decreased Interest 0 0 0  Down, Depressed, Hopeless 0 0 0  PHQ - 2 Score 0 0 0  Altered sleeping 1 0 0  Tired, decreased energy 1 0 0  Change in appetite 0 0 0  Feeling bad or failure about yourself  0 0 0  Trouble concentrating 1 0 0  Moving slowly or fidgety/restless 0 0 0  Suicidal thoughts 0 0 0  PHQ-9 Score 3 0 0  Difficult doing work/chores Not difficult at all Not difficult at all Not difficult at all    BP Readings from Last 3 Encounters:  04/08/23 132/78  03/23/23 122/78  03/01/23 122/80    Physical Exam Vitals and nursing note reviewed.  Constitutional:      General: She is not in  acute distress.    Appearance: Normal appearance. She is well-developed.  HENT:     Head: Normocephalic and atraumatic.     Right Ear: There is impacted cerumen.     Left Ear: Tympanic membrane and ear canal normal.     Nose:     Right Sinus: No maxillary sinus tenderness.     Left Sinus: No  maxillary sinus tenderness.     Mouth/Throat:     Lips: Pink.     Mouth: Mucous membranes are moist.     Tongue: No lesions. Tongue does not deviate from midline.     Palate: No mass and lesions.     Pharynx: Oropharynx is clear.  Neck:     Thyroid: No thyroid tenderness.  Cardiovascular:     Rate and Rhythm: Normal rate and regular rhythm.  Pulmonary:     Effort: Pulmonary effort is normal. No respiratory distress.     Breath sounds: Normal breath sounds. No decreased breath sounds or wheezing.  Lymphadenopathy:     Cervical: Cervical adenopathy present.  Skin:    General: Skin is warm and dry.     Findings: No rash.  Neurological:     Mental Status: She is alert and oriented to person, place, and time.  Psychiatric:        Mood and Affect: Mood normal.        Behavior: Behavior normal.     Wt Readings from Last 3 Encounters:  04/08/23 237 lb 6 oz (107.7 kg)  03/23/23 237 lb (107.5 kg)  03/01/23 237 lb 9.6 oz (107.8 kg)    BP 132/78   Pulse 95   Temp 98.4 F (36.9 C)   Ht 5\' 5"  (1.651 m)   Wt 237 lb 6 oz (107.7 kg)   LMP 04/04/2023   SpO2 96%   BMI 39.50 kg/m   Assessment and Plan:  Problem List Items Addressed This Visit   None Visit Diagnoses       URI, acute    -  Primary   apparent mild URI - now resolving continue tylenol, fluids and rest   Relevant Medications   fluconazole (DIFLUCAN) 150 MG tablet   fluconazole (DIFLUCAN) 100 MG tablet   nystatin (MYCOSTATIN) 100000 UNIT/ML suspension     Thrush       suspect thrush as source of tongue pain she recently took Diflucan one tablet so may be partially treated   Relevant Medications   fluconazole (DIFLUCAN)  150 MG tablet   fluconazole (DIFLUCAN) 100 MG tablet   nystatin (MYCOSTATIN) 100000 UNIT/ML suspension       No follow-ups on file.    Reubin Milan, MD [facility] [place]

## 2023-04-19 ENCOUNTER — Ambulatory Visit
Admission: RE | Admit: 2023-04-19 | Discharge: 2023-04-19 | Disposition: A | Discharge status: Home / Self Care | Source: Ambulatory Visit | Attending: Cardiology | Admitting: Cardiology

## 2023-04-19 DIAGNOSIS — I493 Ventricular premature depolarization: Secondary | ICD-10-CM | POA: Diagnosis present

## 2023-04-19 DIAGNOSIS — E785 Hyperlipidemia, unspecified: Secondary | ICD-10-CM | POA: Insufficient documentation

## 2023-04-19 DIAGNOSIS — E7800 Pure hypercholesterolemia, unspecified: Secondary | ICD-10-CM | POA: Insufficient documentation

## 2023-04-19 DIAGNOSIS — R002 Palpitations: Secondary | ICD-10-CM | POA: Diagnosis present

## 2023-04-19 MED ORDER — GADOBUTROL 1 MMOL/ML IV SOLN
10.0000 mL | Freq: Once | INTRAVENOUS | Status: AC | PRN
  Administered 2023-04-19: 10 mL via INTRAVENOUS

## 2023-04-29 IMAGING — US US ABDOMEN LIMITED
1 series · 14 of 25 positions shown · non-contrast
Comparison: Prior ultrasound 10/01/2016

CLINICAL DATA: Epigastric and right upper quadrant abdominal pain.

EXAM:
ULTRASOUND ABDOMEN LIMITED RIGHT UPPER QUADRANT

[Series 1: us abdomen limited · 0.19mm/px · 14 of 32 slices shown]
[im 1/32]
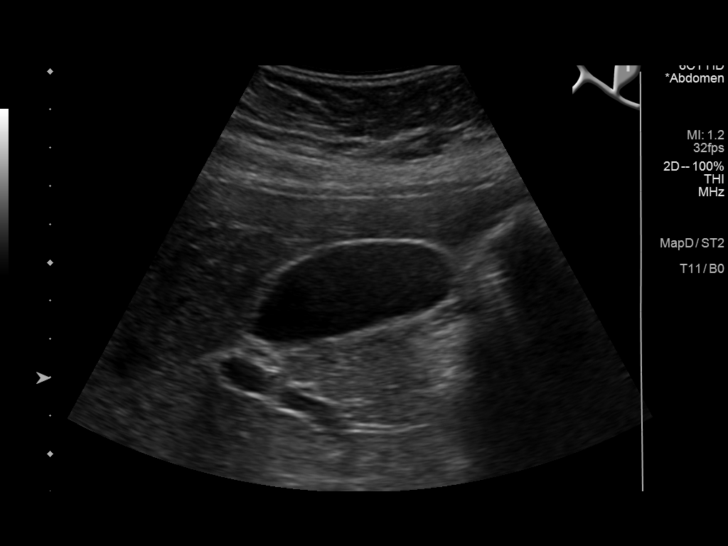
[im 3/32]
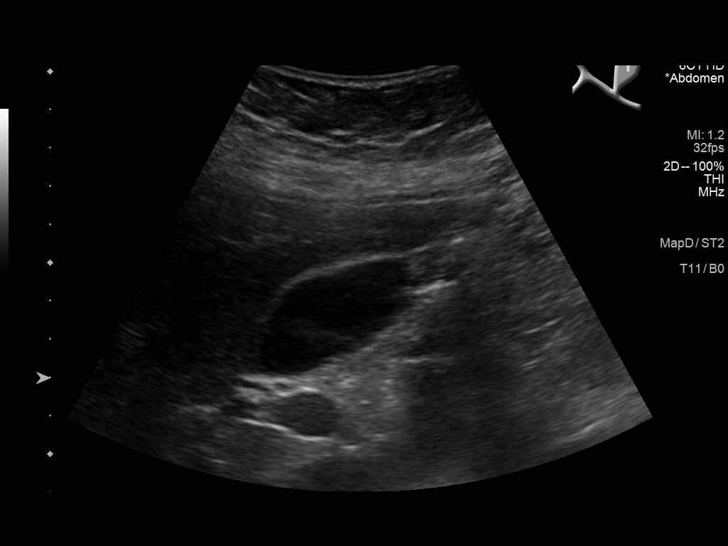
[im 6/32]
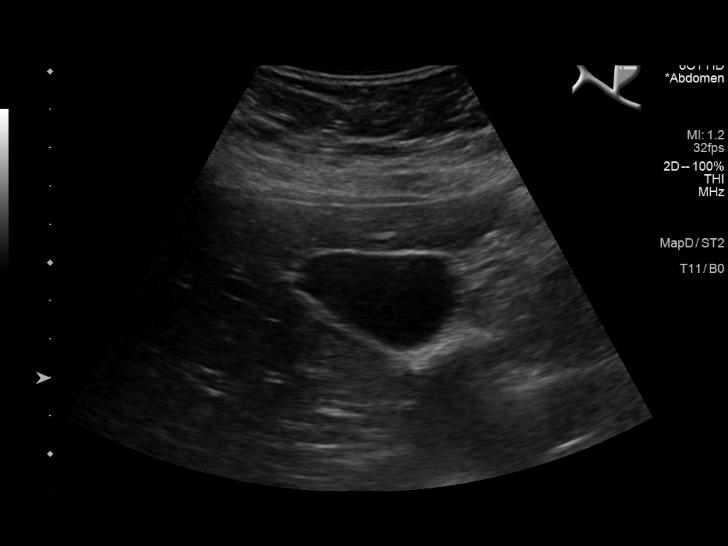
[im 8/32]
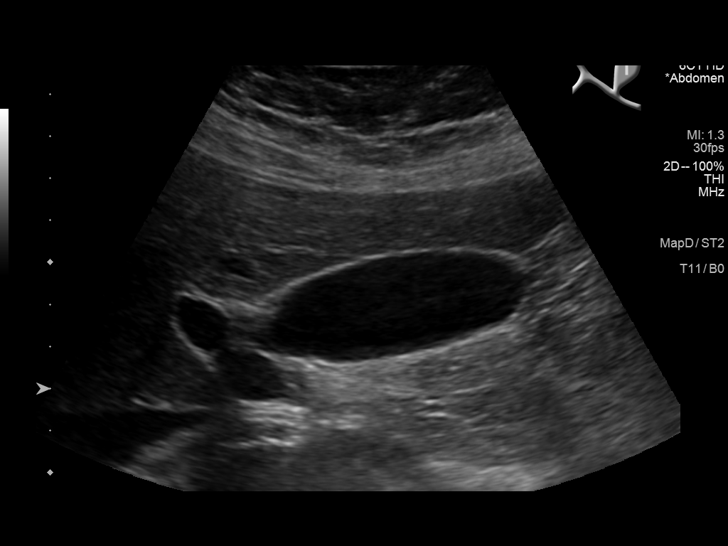
[im 11/32]
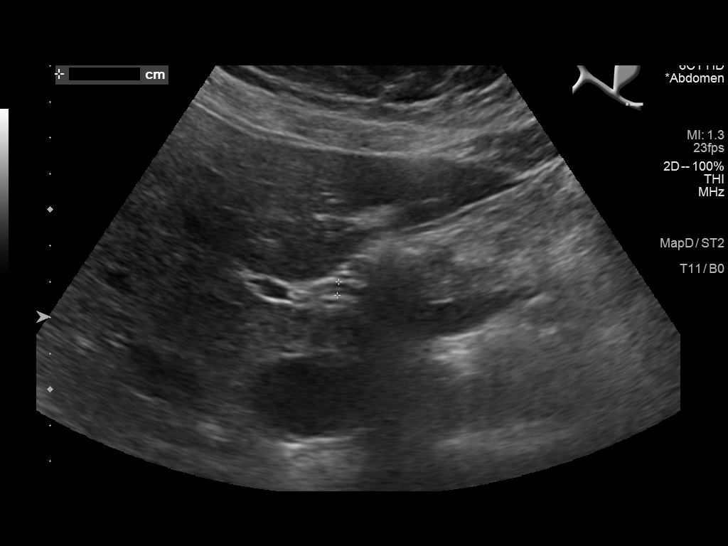
[im 12/32]
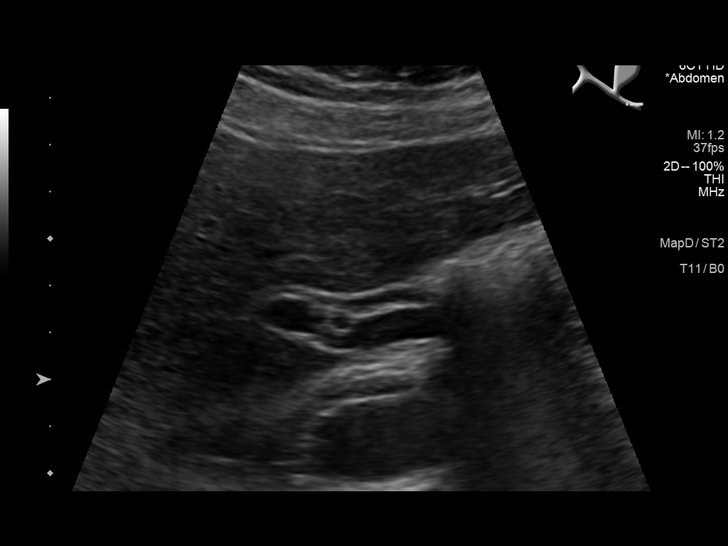
[im 15/32]
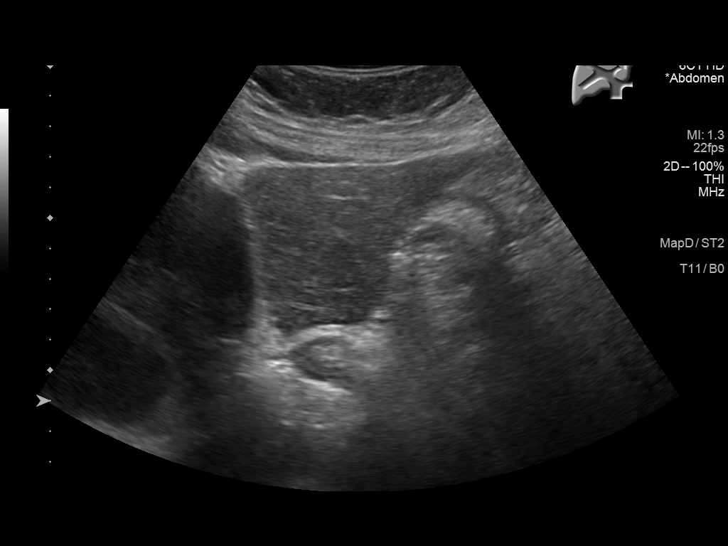
[im 17/32]
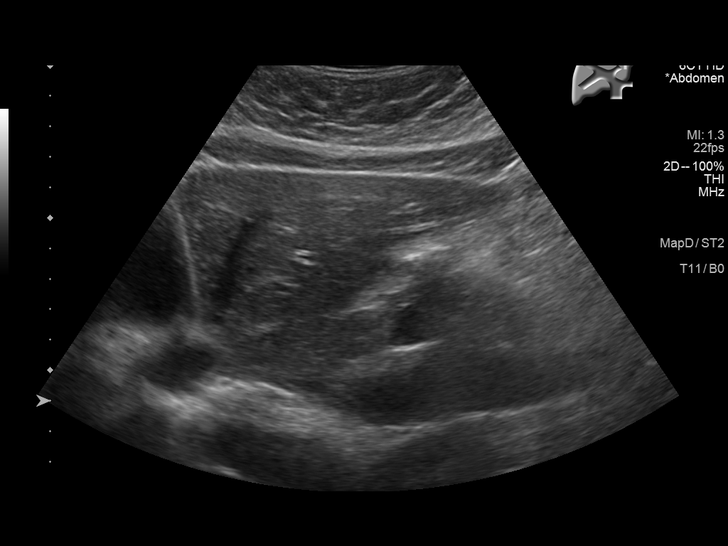
[im 20/32]
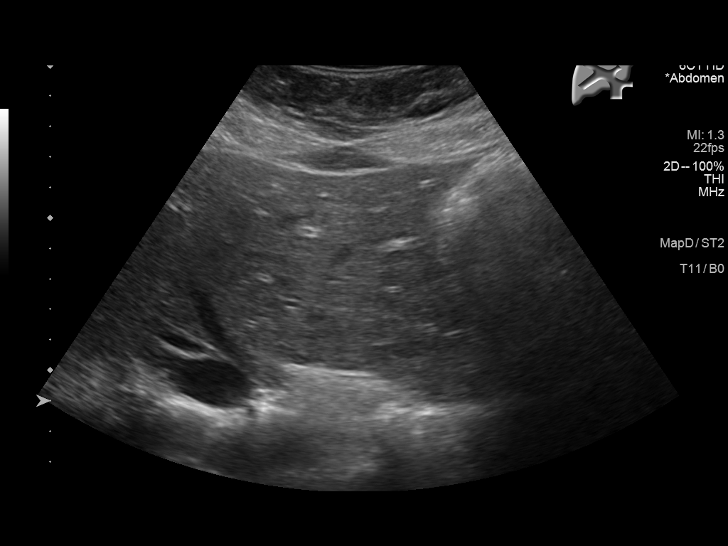
[im 21/32]
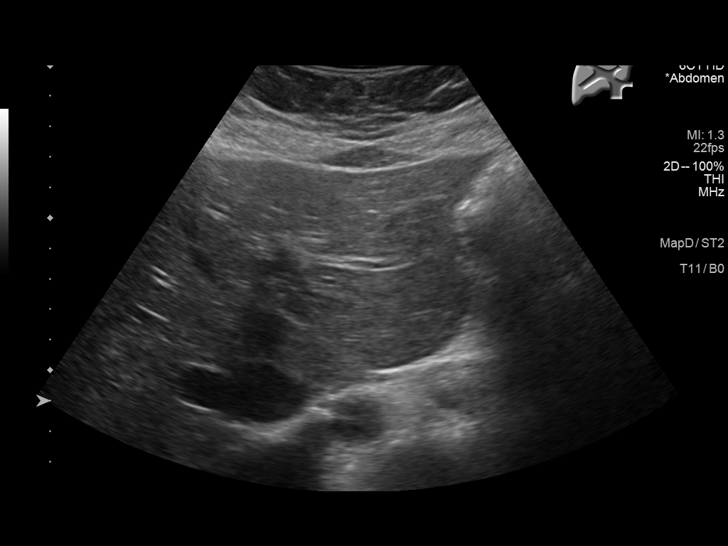
[im 24/32]
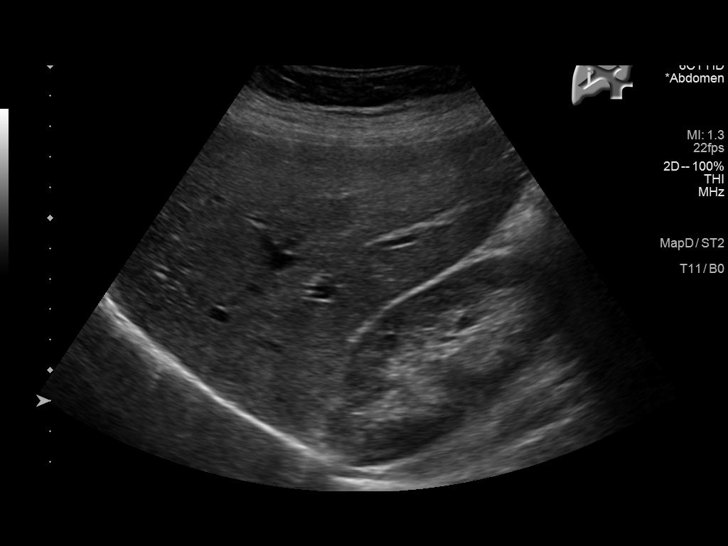
[im 26/32]
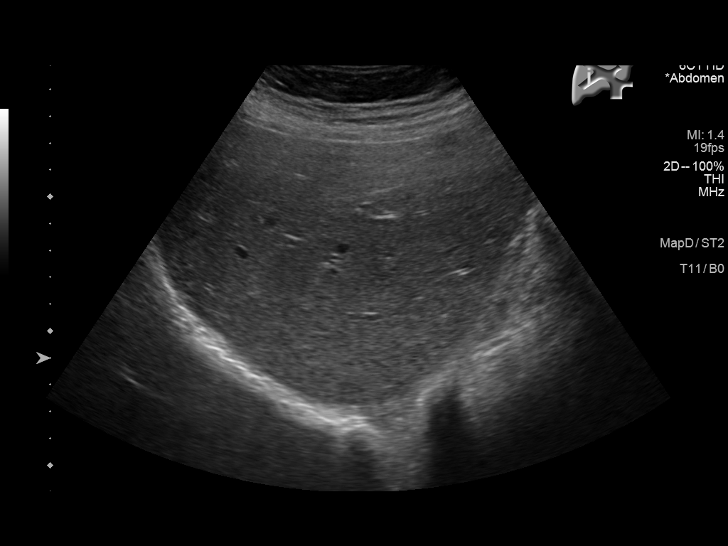
[im 29/32]
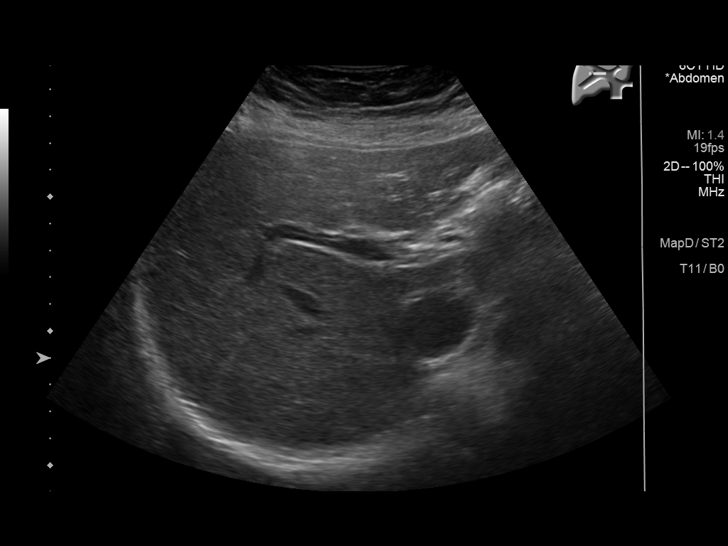
[im 32/32]
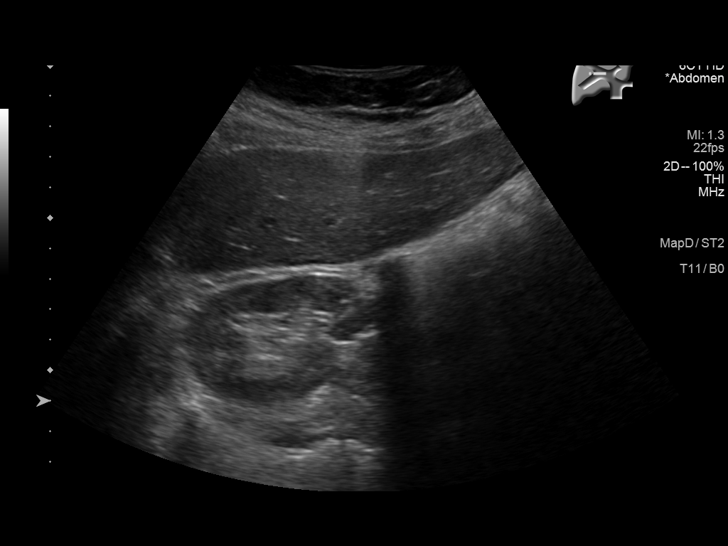

[14 of 25 positions shown; findings below may reference images not displayed]

FINDINGS: Gallbladder:

No gallstones or wall thickening visualized. No sonographic Murphy
sign noted by sonographer.

Common bile duct:

Diameter: 0.3 cm

Liver:

No focal lesion identified. Within normal limits in parenchymal
echogenicity. Portal vein is patent on color Doppler imaging with
normal direction of blood flow towards the liver.

Other: None.
IMPRESSION: Normal hepatobiliary sonogram.

## 2023-05-04 ENCOUNTER — Encounter: Payer: Self-pay | Admitting: Internal Medicine

## 2023-05-04 NOTE — Telephone Encounter (Signed)
 Please review.  KP

## 2023-05-11 ENCOUNTER — Ambulatory Visit (INDEPENDENT_AMBULATORY_CARE_PROVIDER_SITE_OTHER): Admitting: Internal Medicine

## 2023-05-11 ENCOUNTER — Encounter: Payer: Self-pay | Admitting: Internal Medicine

## 2023-05-11 VITALS — BP 116/70 | HR 91 | Ht 65.0 in | Wt 240.5 lb

## 2023-05-11 DIAGNOSIS — H04123 Dry eye syndrome of bilateral lacrimal glands: Secondary | ICD-10-CM

## 2023-05-11 DIAGNOSIS — H04129 Dry eye syndrome of unspecified lacrimal gland: Secondary | ICD-10-CM | POA: Insufficient documentation

## 2023-05-11 NOTE — Assessment & Plan Note (Signed)
 rule out Sjogrens syndrome continue moisturizing eye drops and Biotene mouthwash

## 2023-05-11 NOTE — Progress Notes (Signed)
 Date:  05/11/2023   Name:  Carly Martin   DOB:  Sep 07, 1985   MRN:  [id number]   Chief Complaint: Labs Only  Eye Problem  Both eyes are affected. This is a chronic problem. The problem occurs constantly. There was no injury mechanism. The pain is mild. Pertinent negatives include no fever. Associated symptoms comments: Dry eye.  She also has dry mouth but no worsening dental issues.  She uses Refresh tears and Biotene mouthwash.  Review of Systems  Constitutional:  Negative for chills and fever.  HENT:  Negative for trouble swallowing. Mouth sores: dry mouth.  Eyes:  Positive for pain.  Respiratory:  Negative for chest tightness and shortness of breath.   Cardiovascular:  Negative for chest pain and palpitations.  Musculoskeletal:  Positive for arthralgias.  Psychiatric/Behavioral:  Negative for sleep disturbance.      Lab Results  Component Value Date   NA 138 03/23/2023   K 4.7 03/23/2023   CO2 23 03/23/2023   GLUCOSE 93 03/23/2023   BUN 11 03/23/2023   CREATININE 0.82 03/23/2023   CALCIUM 9.9 03/23/2023   EGFR 94 03/23/2023   GFRNONAA >60 09/08/2021   Lab Results  Component Value Date   CHOL 207 (H) 03/23/2023   HDL 39 (L) 03/23/2023   LDLCALC 155 (H) 03/23/2023   TRIG 73 03/23/2023   CHOLHDL 5.3 (H) 03/23/2023   Lab Results  Component Value Date   TSH 1.520 03/23/2023   Lab Results  Component Value Date   HGBA1C 5.8 (H) 03/23/2023   Lab Results  Component Value Date   WBC 9.9 03/09/2023   HGB 12.0 03/09/2023   HCT 37 03/09/2023   MCV 89.6 09/08/2021   PLT 315 03/09/2023   Lab Results  Component Value Date   ALT 12 03/23/2023   AST 14 03/23/2023   ALKPHOS 69 03/23/2023   BILITOT 0.8 03/23/2023   Lab Results  Component Value Date   VD25OH 17.6 03/09/2023     Patient Active Problem List   Diagnosis Date Noted   Dry eye syndrome 05/11/2023   Prediabetes 03/23/2023   PVC's (premature ventricular contractions) 03/23/2023   Intrinsic  eczema 03/23/2023   Multiple sclerosis (HCC) 04/29/2022   GERD without esophagitis 07/22/2021   Nexplanon in place 02/27/2021   Obesity, Class II, BMI 35-39.9 08/06/2020   Mild hyperlipidemia 07/23/2020   Anxiety state 07/21/2020   Vitamin D deficiency 07/21/2020   Foot drop, right 08/15/2019   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   Irritable bowel syndrome with constipation 09/28/2016   Chronic tension-type headache, not intractable 09/03/2015   Sciatica of right side 03/17/2015   Iron deficiency anemia 03/17/2015   History of loop electrical excision procedure (LEEP) 09/28/2013    Allergies  Allergen Reactions   Codeine Shortness Of Breath    Past Surgical History:  Procedure Laterality Date   CERVICAL BIOPSY  W/ LOOP ELECTRODE EXCISION  2008   TONSILLECTOMY      Social History   Tobacco Use   Smoking status: Former    Current packs/day: 0.00    Average packs/day: 0.3 packs/day for 10.0 years (2.5 ttl pk-yrs)    Types: Cigarettes    Start date: 06/17/2010    Quit date: 06/16/2020    Years since quitting: 2.9   Smokeless tobacco: Never  Vaping Use   Vaping status: Never Used  Substance Use Topics   Alcohol use: Yes    Alcohol/week: 0.0 standard drinks of  alcohol    Comment: socially   Drug use: No     Medication list has been reviewed and updated.  Current Meds  Medication Sig   albuterol (VENTOLIN HFA) 108 (90 Base) MCG/ACT inhaler Inhale 2 puffs into the lungs every 6 (six) hours as needed for wheezing or shortness of breath.   etonogestrel (NEXPLANON) 68 MG IMPL implant 1 each by Subdermal route once.   fluconazole (DIFLUCAN) 150 MG tablet Take 150 mg by mouth once.   metoprolol succinate (TOPROL XL) 25 MG 24 hr tablet Take 1 tablet (25 mg total) by mouth daily.   nystatin (MYCOSTATIN) 100000 UNIT/ML suspension Take 5 mLs (500,000 Units total) by mouth 4 (four) times daily.   omeprazole (PRILOSEC) 20 MG capsule Take  20 mg by mouth daily.   riTUXimab in sodium chloride 0.9 % 250 mL Inject 1,000 mg into the vein every 6 (six) months.   Semaglutide-Weight Management (WEGOVY) 0.25 MG/0.5ML SOAJ Inject 0.25 mg into the skin once a week.   triamcinolone cream (KENALOG) 0.1 % Apply 1 Application topically 2 (two) times daily. To eczema rash on abdomen and back   Vitamin D, Ergocalciferol, (DRISDOL) 1.25 MG (50000 UNIT) CAPS capsule Take 1 capsule by mouth once a week.       05/11/2023    1:17 PM 03/23/2023   11:07 AM 01/04/2023   10:24 AM 04/29/2022    4:08 PM  GAD 7 : Generalized Anxiety Score  Nervous, Anxious, on Edge 0 0 0 0  Control/stop worrying 0 0 0 0  Worry too much - different things 0 0 0 0  Trouble relaxing 0 0 0 0  Restless 0 0 0 0  Easily annoyed or irritable 0 0 0 0  Afraid - awful might happen 0 0 0 0  Total GAD 7 Score 0 0 0 0  Anxiety Difficulty Not difficult at all Not difficult at all Not difficult at all Not difficult at all       05/11/2023    1:17 PM 03/23/2023   11:06 AM 01/04/2023   10:24 AM  Depression screen PHQ 2/9  Decreased Interest 0 0 0  Down, Depressed, Hopeless 0 0 0  PHQ - 2 Score 0 0 0  Altered sleeping 0 1 0  Tired, decreased energy 1 1 0  Change in appetite 0 0 0  Feeling bad or failure about yourself  0 0 0  Trouble concentrating 1 1 0  Moving slowly or fidgety/restless 0 0 0  Suicidal thoughts 0 0 0  PHQ-9 Score 2 3 0  Difficult doing work/chores  Not difficult at all Not difficult at all    BP Readings from Last 3 Encounters:  05/11/23 116/70  04/08/23 132/78  03/23/23 122/78    Physical Exam Vitals and nursing note reviewed.  Constitutional:      General: She is not in acute distress.    Appearance: She is well-developed.  HENT:     Head: Normocephalic and atraumatic.     Mouth/Throat:     Lips: Pink.     Mouth: Mucous membranes are moist.     Tongue: No lesions.     Pharynx: Oropharynx is clear.  Eyes:     General: Lids are normal.      Extraocular Movements: Extraocular movements intact.     Conjunctiva/sclera: Conjunctivae normal.  Cardiovascular:     Rate and Rhythm: Normal rate and regular rhythm.  Pulmonary:     Effort: Pulmonary effort is  normal. No respiratory distress.     Breath sounds: No wheezing or rhonchi.  Skin:    General: Skin is warm and dry.     Findings: No rash.  Neurological:     Mental Status: She is alert and oriented to person, place, and time.  Psychiatric:        Mood and Affect: Mood normal.        Behavior: Behavior normal.     Wt Readings from Last 3 Encounters:  05/11/23 240 lb 8 oz (109.1 kg)  04/08/23 237 lb 6 oz (107.7 kg)  03/23/23 237 lb (107.5 kg)    BP 116/70   Pulse 91   Ht 5\' 5"  (1.651 m)   Wt 240 lb 8 oz (109.1 kg)   SpO2 97%   BMI 40.02 kg/m   Assessment and Plan:  Problem List Items Addressed This Visit       Unprioritized   Dry eye syndrome - Primary (Chronic)   rule out Sjogrens syndrome continue moisturizing eye drops and Biotene mouthwash       Relevant Orders   Sjogren's syndrome antibods(ssa + ssb)    No follow-ups on file.    Reubin Milan, MD [facility] [place]

## 2023-05-12 LAB — SJOGREN'S SYNDROME ANTIBODS(SSA + SSB)
ENA SSA (RO) Ab: <0.2 AI (ref 0.0–0.9)
ENA SSB (LA) Ab: <0.2 AI (ref 0.0–0.9)

## 2023-05-13 ENCOUNTER — Encounter: Payer: Self-pay | Admitting: Internal Medicine

## 2023-06-01 ENCOUNTER — Encounter: Payer: Self-pay | Admitting: Cardiology

## 2023-06-01 ENCOUNTER — Ambulatory Visit: Payer: 59 | Attending: Cardiology | Admitting: Cardiology

## 2023-06-01 VITALS — BP 110/74 | HR 91 | Resp 16 | Ht 65.0 in | Wt 239.2 lb

## 2023-06-01 DIAGNOSIS — E7800 Pure hypercholesterolemia, unspecified: Secondary | ICD-10-CM

## 2023-06-01 DIAGNOSIS — I493 Ventricular premature depolarization: Secondary | ICD-10-CM | POA: Diagnosis not present

## 2023-06-01 DIAGNOSIS — Z6839 Body mass index (BMI) 39.0-39.9, adult: Secondary | ICD-10-CM

## 2023-06-01 NOTE — Patient Instructions (Signed)
 Medication Instructions:  Your physician recommends the following medication changes.  STOP TAKING: Metoprolol    *If you need a refill on your cardiac medications before your next appointment, please call your pharmacy*  Lab Work: Your provider would like for you to have following labs drawn today BMP, H&H.    If you have labs (blood work) drawn today and your tests are completely normal, you will receive your results only by: MyChart Message (if you have MyChart) OR A paper copy in the mail If you have any lab test that is abnormal or we need to change your treatment, we will call you to review the results.  Testing/Procedures: Your physician has requested that you have an echocardiogram in 1 year. Echocardiography is a painless test that uses sound waves to create images of your heart. It provides your doctor with information about the size and shape of your heart and how well your heart's chambers and valves are working.   You may receive an ultrasound enhancing agent through an IV if needed to better visualize your heart during the echo. This procedure takes approximately one hour.  There are no restrictions for this procedure.  This will take place at [address] [facility] [address]) [address], [place] [address]  Please note: We ask at that you not bring children with you during ultrasound (echo/ vascular) testing. Due to room size and safety concerns, children are not allowed in the ultrasound rooms during exams. Our front office staff cannot provide observation of children in our lobby area while testing is being conducted. An adult accompanying a patient to their appointment will only be allowed in the ultrasound room at the discretion of the ultrasound technician under special circumstances. We apologize for any inconvenience.        [facility] [address] [place], [place] [address] [phone] Please take advantage of the free valet  parking available at the MAIN entrance. Proceed to [facility] registration for check-in (first floor).  Magnetic resonance imaging (MRI) is a painless test that produces images of the inside of the body without using Xrays.  During an MRI, strong magnets and radio waves work together in a Data processing manager to form detailed images.   MRI images may provide more details about a medical condition than X-rays, CT scans, and ultrasounds can provide.  You may be given earphones to listen for instructions.  You may eat a light breakfast and take medications as ordered with the exception of HCTZ (fluid pill, other). Please avoid stimulants for 12 hr prior to test. (Ie. Caffeine, nicotine , chocolate, or antihistamine medications)  If a contrast material will be used, an IV will be inserted into one of your veins. Contrast material will be injected into your IV. It will leave your body through your urine within a day. You may be told to drink plenty of fluids to help flush the contrast material out of your system.  You will be asked to remove all metal, including: Watch, jewelry, and other metal objects including hearing aids, hair pieces and dentures. Also wearable glucose monitoring systems (ie. Freestyle Libre and Omnipods) (Braces and fillings normally are not a problem.)   TEST WILL TAKE APPROXIMATELY 1 HOUR  PLEASE NOTIFY SCHEDULING AT LEAST 24 HOURS IN ADVANCE IF YOU ARE UNABLE TO KEEP YOUR APPOINTMENT. [phone]  Please call Jinger Mount, cardiac imaging nurse navigator with any questions/concerns. Jinger Mount RN Navigator Cardiac Imaging Chase Copping RN Navigator Cardiac Imaging [facility] [phone] Office   Follow-Up: At [facility], you and your health needs are our priority.  As part of our continuing mission to provide you with exceptional heart care, our providers are all part of one team.  This team includes your primary Cardiologist  (physician) and Advanced Practice Providers or APPs (Physician Assistants and Nurse Practitioners) who all work together to provide you with the care you need, when you need it.  Your next appointment:   1 year(s)  Provider:   You may see Constancia Delton, MD or one of the following Advanced Practice Providers on your designated Care Team:   Laneta Pintos, NP Gildardo Labrador, PA-C Varney Gentleman, PA-C Cadence [place], PA-C Ronald Cockayne, NP Morey Ar, NP    We recommend signing up for the patient portal called "MyChart".  Sign up information is provided on this After Visit Summary.  MyChart is used to connect with patients for Virtual Visits (Telemedicine).  Patients are able to view lab/test results, encounter notes, upcoming appointments, etc.  Non-urgent messages can be sent to your provider as well.   To learn more about what you can do with MyChart, go to [email or web address].

## 2023-06-01 NOTE — Progress Notes (Signed)
 Cardiology Office Note:    Date:  06/01/2023   ID:  Carly Martin, DOB 1985-11-01, MRN [id number]  PCP:  Sheron Dixons, MD   [facility] Providers Cardiologist:  Constancia Delton, MD Electrophysiologist:  Boyce Byes, MD     Referring MD: Sheron Dixons, MD   Chief Complaint  Patient presents with   Benign paroxysmal positional vertigo, unspecified laterality   Follow-up    3 months    History of Present Illness:    Carly Martin is a 38 y.o. female with a hx of multiple sclerosis, former smoker x15 years, frequent PVCs, right-sided aortic arch who presents for follow-up.  Last seen due to frequent PVCs.  Metoprolol  was recommended, patient has not started taking.  Will not want to take any new medications.  Previously did not tolerate Cardizem  due to worsening dizziness.  Cardiac MRI was recommended after EP eval.  MR angio of chest was instead obtained.  She denies palpitations.  Denies any new concerns.  Prior notes Echo 01/2023 EF 60 to 65% Echo 12/2021 EF 55 to 60% Cardiac monitor 11/2021 frequent PVCs, 21.5% burden.   Past Medical History:  Diagnosis Date   Asthma     Past Surgical History:  Procedure Laterality Date   CERVICAL BIOPSY  W/ LOOP ELECTRODE EXCISION  2008   TONSILLECTOMY      Current Medications: Current Meds  Medication Sig   albuterol  (VENTOLIN  HFA) 108 (90 Base) MCG/ACT inhaler Inhale 2 puffs into the lungs every 6 (six) hours as needed for wheezing or shortness of breath.   etonogestrel (NEXPLANON) 68 MG IMPL implant 1 each by Subdermal route once.   omeprazole  (PRILOSEC) 20 MG capsule Take 20 mg by mouth daily.   riTUXimab  in sodium chloride  0.9 % 250 mL Inject 1,000 mg into the vein every 6 (six) months.   Semaglutide -Weight Management (WEGOVY ) 0.25 MG/0.5ML SOAJ Inject 0.25 mg into the skin once a week.   triamcinolone  cream (KENALOG ) 0.1 % Apply 1 Application topically 2 (two) times daily. To eczema rash  on abdomen and back   Vitamin D , Ergocalciferol , (DRISDOL) 1.25 MG (50000 UNIT) CAPS capsule Take 1 capsule by mouth once a week.     Allergies:   Codeine   Social History   Socioeconomic History   Marital status: Married    Spouse name: Not on file   Number of children: Not on file   Years of education: Not on file   Highest education level: Not on file  Occupational History   Not on file  Tobacco Use   Smoking status: Former    Current packs/day: 0.00    Average packs/day: 0.3 packs/day for 10.0 years (2.5 ttl pk-yrs)    Types: Cigarettes    Start date: 06/17/2010    Quit date: 06/16/2020    Years since quitting: 2.9   Smokeless tobacco: Never  Vaping Use   Vaping status: Never Used  Substance and Sexual Activity   Alcohol use: Yes    Alcohol/week: 0.0 standard drinks of alcohol    Comment: socially   Drug use: No   Sexual activity: Yes  Other Topics Concern   Not on file  Social History Narrative   Not on file   Social Drivers of Health   Financial Resource Strain: Low Risk  (07/02/2022)   Received from [facility], [facility]   Overall Financial Resource Strain (CARDIA)    Difficulty of Paying Living Expenses:  Not hard at all  Food Insecurity: No Food Insecurity (07/02/2022)   Received from [facility], [facility]   Hunger Vital Sign    Worried About Running Out of Food in the Last Year: Never true    Ran Out of Food in the Last Year: Never true  Transportation Needs: No Transportation Needs (07/02/2022)   Received from [facility], [facility]   [facility] - Transportation    In the past 12 months, has lack of transportation kept you from medical appointments or from getting medications?: No    Lack of Transportation (Non-Medical): No  Physical Activity: Inactive (07/02/2022)   Received from [facility], [facility]    Exercise Vital Sign    Days of Exercise per Week: 0 days    Minutes of Exercise per Session: 0 min  Stress: No Stress Concern Present (07/02/2022)   Received from [facility], [facility]   [employer] of Occupational Health - Occupational Stress Questionnaire    Feeling of Stress : Not at all  Social Connections: Moderately Isolated (07/02/2022)   Received from [facility], [facility]   Social Connection and Isolation Panel [NHANES]    Frequency of Communication with Friends and Family: More than three times a week    Frequency of Social Gatherings with Friends and Family: More than three times a week    Attends Religious Services: Never    Database administrator or Organizations: No    Attends Engineer, structural: Never    Marital Status: Married     Family History: The patient's family history includes Cancer in her maternal grandfather and paternal grandfather; Migraines in her sister; Pancreatic cancer in her father.  ROS:   Please see the history of present illness.     All other systems reviewed and are negative.  EKGs/Labs/Other Studies Reviewed:    The following studies were reviewed today:        Recent Labs: 03/09/2023: Hemoglobin 12.0; Platelets 315 03/23/2023: ALT 12; BUN 11; Creatinine, Ser 0.82; Potassium 4.7; Sodium 138; TSH 1.520  Recent Lipid Panel    Component Value Date/Time   CHOL 207 (H) 03/23/2023 1142   TRIG 73 03/23/2023 1142   HDL 39 (L) 03/23/2023 1142   CHOLHDL 5.3 (H) 03/23/2023 1142   LDLCALC 155 (H) 03/23/2023 1142     Risk Assessment/Calculations:             Physical Exam:    VS:  BP 110/74 (BP Location: Left Arm, Patient Position: Sitting, Cuff Size: Large)   Pulse 91   Resp 16   Ht 5\' 5"  (1.651 m)   Wt 239 lb 3.2 oz (108.5 kg)   SpO2 98%   BMI 39.80 kg/m     Wt Readings from Last 3 Encounters:  06/01/23 239 lb 3.2 oz (108.5 kg)   05/11/23 240 lb 8 oz (109.1 kg)  04/08/23 237 lb 6 oz (107.7 kg)     GEN:  Well nourished, well developed in no acute distress HEENT: Normal NECK: No JVD; No carotid bruits CARDIAC: RRR, no murmurs, rubs, gallops RESPIRATORY:  Clear to auscultation without rales, wheezing or rhonchi  ABDOMEN: Soft, non-tender, non-distended MUSCULOSKELETAL:  No edema; No deformity  SKIN: Warm and dry NEUROLOGIC:  Alert and oriented x 3 PSYCHIATRIC:  Normal affect   ASSESSMENT:    1. Frequent PVCs  2. Pure hypercholesterolemia   3. BMI 39.0-39.9,adult     PLAN:    In order of problems listed above:  Frequent PVCs, 10/23 cardiac monitor with 21.5% burden.  Did not tolerate Cardizem , declined metoprolol . echo 01/2023 EF 60 to 65%.  Lately asymptomatic.  PVCs etiology likely from rituximab  for treatment of multiple sclerosis.  Wrong modality for MRI obtained.  Will order CMR to evaluate infiltration or scar.  Appreciate input from EP.  MR angio chest showed right-sided aortic arch, with normal branching pattern.  No evidence for aneurysm or dissection.  Will monitor patient serially with limited echo's evaluate any reduction in function.  Plan Limited echo in 1 year.  Keep appointment with EP. Hyperlipidemia, not in statin benefit group, continue low-cholesterol diet. Obesity, low-calorie diet.  On GLP 2.  Follow-up in 12 months.     Medication Adjustments/Labs and Tests Ordered: Current medicines are reviewed at length with the patient today.  Concerns regarding medicines are outlined above.  Orders Placed This Encounter  Procedures   MR CARDIAC MORPHOLOGY W WO CONTRAST   Basic Metabolic Panel (BMET)   Hemoglobin and hematocrit, blood   ECHOCARDIOGRAM LIMITED   No orders of the defined types were placed in this encounter.   Patient Instructions  Medication Instructions:  Your physician recommends the following medication changes.  STOP TAKING: Metoprolol    *If you need a refill on  your cardiac medications before your next appointment, please call your pharmacy*  Lab Work: Your provider would like for you to have following labs drawn today BMP, H&H.    If you have labs (blood work) drawn today and your tests are completely normal, you will receive your results only by: MyChart Message (if you have MyChart) OR A paper copy in the mail If you have any lab test that is abnormal or we need to change your treatment, we will call you to review the results.  Testing/Procedures: Your physician has requested that you have an echocardiogram in 1 year. Echocardiography is a painless test that uses sound waves to create images of your heart. It provides your doctor with information about the size and shape of your heart and how well your heart's chambers and valves are working.   You may receive an ultrasound enhancing agent through an IV if needed to better visualize your heart during the echo. This procedure takes approximately one hour.  There are no restrictions for this procedure.  This will take place at [address] [facility]) [address], [place] [address]  Please note: We ask at that you not bring children with you during ultrasound (echo/ vascular) testing. Due to room size and safety concerns, children are not allowed in the ultrasound rooms during exams. Our front office staff cannot provide observation of children in our lobby area while testing is being conducted. An adult accompanying a patient to their appointment will only be allowed in the ultrasound room at the discretion of the ultrasound technician under special circumstances. We apologize for any inconvenience.        [facility] [address] [place], [place] [address] [phone] Please take advantage of the free valet parking available at the MAIN entrance. Proceed to [facility] registration for check-in (first floor).  Magnetic resonance imaging (MRI) is a painless  test that produces images of the inside of the body without using Xrays.  During an MRI, strong magnets and radio waves work together in a Data processing manager to form detailed  images.   MRI images may provide more details about a medical condition than X-rays, CT scans, and ultrasounds can provide.  You may be given earphones to listen for instructions.  You may eat a light breakfast and take medications as ordered with the exception of HCTZ (fluid pill, other). Please avoid stimulants for 12 hr prior to test. (Ie. Caffeine, nicotine , chocolate, or antihistamine medications)  If a contrast material will be used, an IV will be inserted into one of your veins. Contrast material will be injected into your IV. It will leave your body through your urine within a day. You may be told to drink plenty of fluids to help flush the contrast material out of your system.  You will be asked to remove all metal, including: Watch, jewelry, and other metal objects including hearing aids, hair pieces and dentures. Also wearable glucose monitoring systems (ie. Freestyle Libre and Omnipods) (Braces and fillings normally are not a problem.)   TEST WILL TAKE APPROXIMATELY 1 HOUR  PLEASE NOTIFY SCHEDULING AT LEAST 24 HOURS IN ADVANCE IF YOU ARE UNABLE TO KEEP YOUR APPOINTMENT. [phone]  Please call Jinger Mount, cardiac imaging nurse navigator with any questions/concerns. Jinger Mount RN Navigator Cardiac Imaging Chase Copping RN Navigator Cardiac Imaging [facility] [phone] Office   Follow-Up: At [facility], you and your health needs are our priority.  As part of our continuing mission to provide you with exceptional heart care, our providers are all part of one team.  This team includes your primary Cardiologist (physician) and Advanced Practice Providers or APPs (Physician Assistants and Nurse Practitioners) who all work together to provide you with the care you  need, when you need it.  Your next appointment:   1 year(s)  Provider:   You may see Constancia Delton, MD or one of the following Advanced Practice Providers on your designated Care Team:   Laneta Pintos, NP Gildardo Labrador, PA-C Varney Gentleman, PA-C Cadence [place], PA-C Ronald Cockayne, NP Morey Ar, NP    We recommend signing up for the patient portal called "MyChart".  Sign up information is provided on this After Visit Summary.  MyChart is used to connect with patients for Virtual Visits (Telemedicine).  Patients are able to view lab/test results, encounter notes, upcoming appointments, etc.  Non-urgent messages can be sent to your provider as well.   To learn more about what you can do with MyChart, go to [email or web address].          Signed, Constancia Delton, MD  06/01/2023 1:13 PM    [facility]

## 2023-06-02 LAB — BASIC METABOLIC PANEL WITH GFR
BUN/Creatinine Ratio: 16 (ref 9–23)
BUN: 11 mg/dL (ref 6–20)
CO2: 20 mmol/L (ref 20–29)
Calcium: 9.4 mg/dL (ref 8.7–10.2)
Chloride: 103 mmol/L (ref 96–106)
Creatinine, Ser: 0.69 mg/dL (ref 0.57–1.00)
Glucose: 91 mg/dL (ref 70–99)
Potassium: 5 mmol/L (ref 3.5–5.2)
Sodium: 138 mmol/L (ref 134–144)
eGFR: 113 mL/min/{1.73_m2} (ref >59)

## 2023-06-02 LAB — HEMOGLOBIN AND HEMATOCRIT, BLOOD
Hematocrit: 40.2 % (ref 34.0–46.6)
Hemoglobin: 12.6 g/dL (ref 11.1–15.9)

## 2023-06-08 ENCOUNTER — Ambulatory Visit

## 2023-06-15 ENCOUNTER — Ambulatory Visit
Admission: RE | Admit: 2023-06-15 | Discharge: 2023-06-15 | Disposition: A | Discharge status: Home / Self Care | Source: Ambulatory Visit | Attending: Cardiology | Admitting: Cardiology

## 2023-06-15 DIAGNOSIS — I493 Ventricular premature depolarization: Secondary | ICD-10-CM

## 2023-06-16 ENCOUNTER — Ambulatory Visit
Admission: RE | Admit: 2023-06-16 | Discharge: 2023-06-16 | Disposition: A | Discharge status: Home / Self Care | Source: Ambulatory Visit | Attending: Cardiology | Admitting: Cardiology

## 2023-06-16 ENCOUNTER — Ambulatory Visit: Payer: Self-pay | Admitting: Cardiology

## 2023-06-16 ENCOUNTER — Other Ambulatory Visit: Payer: Self-pay | Admitting: Cardiology

## 2023-06-16 DIAGNOSIS — I493 Ventricular premature depolarization: Secondary | ICD-10-CM

## 2023-06-16 MED ORDER — GADOBUTROL 1 MMOL/ML IV SOLN
12.0000 mL | Freq: Once | INTRAVENOUS | Status: AC | PRN
  Administered 2023-06-16: 12 mL via INTRAVENOUS

## 2023-07-26 ENCOUNTER — Encounter: Payer: Self-pay | Admitting: Internal Medicine

## 2023-07-27 ENCOUNTER — Ambulatory Visit (INDEPENDENT_AMBULATORY_CARE_PROVIDER_SITE_OTHER): Payer: 59

## 2023-07-27 DIAGNOSIS — Z23 Encounter for immunization: Secondary | ICD-10-CM | POA: Diagnosis not present

## 2023-10-13 ENCOUNTER — Encounter: Payer: Self-pay | Admitting: Internal Medicine

## 2023-10-13 ENCOUNTER — Ambulatory Visit (INDEPENDENT_AMBULATORY_CARE_PROVIDER_SITE_OTHER): Admitting: Internal Medicine

## 2023-10-13 ENCOUNTER — Other Ambulatory Visit: Payer: Self-pay | Admitting: Internal Medicine

## 2023-10-13 VITALS — BP 122/76 | HR 101 | Ht 65.0 in | Wt 228.0 lb

## 2023-10-13 DIAGNOSIS — R350 Frequency of micturition: Secondary | ICD-10-CM

## 2023-10-13 DIAGNOSIS — E66812 Obesity, class 2: Secondary | ICD-10-CM | POA: Diagnosis not present

## 2023-10-13 LAB — POCT URINALYSIS DIPSTICK
Bilirubin, UA: NEGATIVE
Glucose, UA: NEGATIVE
Ketones, UA: NEGATIVE
Nitrite, UA: POSITIVE
Protein, UA: NEGATIVE
Spec Grav, UA: 1.025 (ref 1.010–1.025)
Urobilinogen, UA: 0.2 U/dL
pH, UA: 6 (ref 5.0–8.0)

## 2023-10-13 MED ORDER — NITROFURANTOIN MONOHYD MACRO 100 MG PO CAPS: 100.0000 mg | ORAL_CAPSULE | Freq: Two times a day (BID) | ORAL | 0 refills | Status: AC

## 2023-10-13 NOTE — Progress Notes (Signed)
 Date:  10/13/2023   Name:  Carly Martin   DOB:  11-20-1985   MRN:  [id number]   Chief Complaint: Urinary Frequency (X5 days,odor,nausea, cloudy )  Urinary Frequency  This is a new problem. The current episode started in the past 7 days. The problem occurs every urination. The quality of the pain is described as burning. The patient is experiencing no pain. There has been no fever. Associated symptoms include flank pain, frequency, nausea and urgency.  Obesity - on semaglutide  but wants to try Zepbound if insurance will cover it.  Review of Systems  Constitutional:  Positive for unexpected weight change (12 lbs on Semaglutide ). Negative for fatigue.  HENT:  Negative for trouble swallowing.   Eyes:  Negative for visual disturbance.  Respiratory:  Negative for cough, chest tightness, shortness of breath and wheezing.   Cardiovascular:  Negative for chest pain, palpitations and leg swelling.  Gastrointestinal:  Positive for nausea. Negative for abdominal pain, constipation and diarrhea.  Genitourinary:  Positive for flank pain, frequency and urgency. Negative for dysuria.  Musculoskeletal:  Negative for arthralgias and myalgias.  Neurological:  Negative for dizziness, weakness, light-headedness and headaches.     Lab Results  Component Value Date   NA 138 06/01/2023   K 5.0 06/01/2023   CO2 20 06/01/2023   GLUCOSE 91 06/01/2023   BUN 11 06/01/2023   CREATININE 0.69 06/01/2023   CALCIUM 9.4 06/01/2023   EGFR 113 06/01/2023   GFRNONAA >60 09/08/2021   Lab Results  Component Value Date   CHOL 207 (H) 03/23/2023   HDL 39 (L) 03/23/2023   LDLCALC 155 (H) 03/23/2023   TRIG 73 03/23/2023   CHOLHDL 5.3 (H) 03/23/2023   Lab Results  Component Value Date   TSH 1.520 03/23/2023   Lab Results  Component Value Date   HGBA1C 5.8 (H) 03/23/2023   Lab Results  Component Value Date   WBC 9.9 03/09/2023   HGB 12.6 06/01/2023   HCT 40.2 06/01/2023   MCV 89.6 09/08/2021    PLT 315 03/09/2023   Lab Results  Component Value Date   ALT 12 03/23/2023   AST 14 03/23/2023   ALKPHOS 69 03/23/2023   BILITOT 0.8 03/23/2023   Lab Results  Component Value Date   VD25OH 17.6 03/09/2023     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   Irritable bowel syndrome with constipation 09/28/2016   Chronic tension-type headache, not intractable 09/03/2015   Sciatica of right side 03/17/2015   Iron deficiency anemia 03/17/2015   History of loop electrical excision procedure (LEEP) 09/28/2013    Allergies  Allergen Reactions   Codeine Shortness Of Breath    Past Surgical History:  Procedure Laterality Date   CERVICAL BIOPSY  W/ LOOP ELECTRODE EXCISION  2008   TONSILLECTOMY      Social History   Tobacco Use   Smoking status: Former    Current packs/day: 0.00    Average packs/day: 0.3 packs/day for 10.0 years (2.5 ttl pk-yrs)  Types: Cigarettes    Start date: 06/17/2010    Quit date: 06/16/2020    Years since quitting: 3.3   Smokeless tobacco: Never  Vaping Use   Vaping status: Never Used  Substance Use Topics   Alcohol use: Yes    Alcohol/week: 0.0 standard drinks of alcohol    Comment: socially   Drug use: No     Medication list has been reviewed and updated.  Current Meds  Medication Sig   albuterol  (VENTOLIN  HFA) 108 (90 Base) MCG/ACT inhaler Inhale 2 puffs into the lungs every 6 (six) hours as needed for wheezing or shortness of breath.    etonogestrel (NEXPLANON) 68 MG IMPL implant 1 each by Subdermal route once.   nitrofurantoin , macrocrystal-monohydrate, (MACROBID ) 100 MG capsule Take 1 capsule (100 mg total) by mouth 2 (two) times daily for 7 days.   omeprazole  (PRILOSEC) 20 MG capsule Take 20 mg by mouth daily.   riTUXimab  in sodium chloride  0.9 % 250 mL Inject 1,000 mg into the vein every 6 (six) months.   Semaglutide -Weight Management (WEGOVY ) 0.25 MG/0.5ML SOAJ Inject 0.25 mg into the skin once a week.   triamcinolone  cream (KENALOG ) 0.1 % Apply 1 Application topically 2 (two) times daily. To eczema rash on abdomen and back   Vitamin D , Ergocalciferol , (DRISDOL) 1.25 MG (50000 UNIT) CAPS capsule Take 1 capsule by mouth once a week.       10/13/2023   11:27 AM 05/11/2023    1:17 PM 03/23/2023   11:07 AM 01/04/2023   10:24 AM  GAD 7 : Generalized Anxiety Score  Nervous, Anxious, on Edge 0 0 0 0  Control/stop worrying 0 0 0 0  Worry too much - different things 0 0 0 0  Trouble relaxing 0 0 0 0  Restless 0 0 0 0  Easily annoyed or irritable 0 0 0 0  Afraid - awful might happen 0 0 0 0  Total GAD 7 Score 0 0 0 0  Anxiety Difficulty Not difficult at all Not difficult at all Not difficult at all Not difficult at all       10/13/2023   11:27 AM 05/11/2023    1:17 PM 03/23/2023   11:06 AM  Depression screen PHQ 2/9  Decreased Interest 0 0 0  Down, Depressed, Hopeless 0 0 0  PHQ - 2 Score 0 0 0  Altered sleeping  0 1  Tired, decreased energy  1 1  Change in appetite  0 0  Feeling bad or failure about yourself   0 0  Trouble concentrating  1 1  Moving slowly or fidgety/restless  0 0  Suicidal thoughts  0 0  PHQ-9 Score  2 3  Difficult doing work/chores   Not difficult at all    BP Readings from Last 3 Encounters:  10/13/23 122/76  06/01/23 110/74  05/11/23 116/70    Physical Exam Vitals and nursing note reviewed.  Constitutional:      Appearance: Normal appearance. She is well-developed.  Cardiovascular:      Rate and Rhythm: Normal rate and regular rhythm.     Pulses: Normal pulses.     Heart sounds: Normal heart sounds.  Pulmonary:     Effort: Pulmonary effort is normal. No respiratory distress.     Breath sounds: Normal breath sounds.  Abdominal:     General: Bowel sounds are normal.     Palpations: Abdomen is soft.     Tenderness: There is no abdominal tenderness. There is  no right CVA tenderness, left CVA tenderness, guarding or rebound.  Skin:    General: Skin is warm and dry.  Neurological:     General: No focal deficit present.     Mental Status: She is alert.  Psychiatric:        Mood and Affect: Mood normal.        Behavior: Behavior normal.    Lab Results  Component Value Date   COLORU yellow 10/13/2023   CLARITYU cloudy 10/13/2023   GLUCOSEUR Negative 10/13/2023   BILIRUBINUR neg 10/13/2023   KETONESU neg 10/13/2023   SPECGRAV 1.025 10/13/2023   RBCUR large 10/13/2023   PHUR 6.0 10/13/2023   PROTEINUR Negative 10/13/2023   UROBILINOGEN 0.2 10/13/2023   LEUKOCYTESUR Small (1+) (A) 10/13/2023    Wt Readings from Last 3 Encounters:  10/13/23 228 lb (103.4 kg)  06/01/23 239 lb 3.2 oz (108.5 kg)  05/11/23 240 lb 8 oz (109.1 kg)    BP 122/76   Pulse (!) 101   Ht 5' 5 (1.651 m)   Wt 228 lb (103.4 kg)   SpO2 97%   BMI 37.94 kg/m   Assessment and Plan:  Problem List Items Addressed This Visit       Unprioritized   Obesity, Class II, BMI 35-39.9   She is now on Semaglutide  compounded from a weight loss clinic in [place]. She has lost 12 lbs in about 3 months.  No side effects noted. She is interested in trying Zepbound if covered by insurance - she will check with insurance.      Other Visit Diagnoses       Urinary frequency    -  Primary   continue to push fluids treat empirically with macrobid  adjust treatment per culture results   Relevant Medications   nitrofurantoin , macrocrystal-monohydrate, (MACROBID ) 100 MG capsule   Other Relevant  Orders   POCT Urinalysis Dipstick (Completed)   Urine Culture       No follow-ups on file.    Leita HILARIO Adie, MD [facility] [place]

## 2023-10-13 NOTE — Assessment & Plan Note (Signed)
 She is now on Semaglutide  compounded from a weight loss clinic in [place]. She has lost 12 lbs in about 3 months.  No side effects noted. She is interested in trying Zepbound if covered by insurance - she will check with insurance.

## 2023-10-13 NOTE — Assessment & Plan Note (Signed)
>>  ASSESSMENT AND PLAN FOR OBESITY, CLASS II, BMI 35-39.9 WRITTEN ON 10/13/2023 11:52 AM BY Aurelio Mccamy, LEITA DEL, MD  She is now on Semaglutide  compounded from a weight loss clinic in [place]. She has lost 12 lbs in about 3 months.  No side effects noted. She is interested in trying Zepbound if covered by insurance - she will check with insurance.

## 2023-10-13 NOTE — Progress Notes (Deleted)
 Date:  10/13/2023   Name:  Carly Martin   DOB:  [birth year]   MRN:  [id number]   Chief Complaint: No chief complaint on file.  Urinary Frequency  This is a new problem. Episode onset: X5 days. There has been no fever. Associated symptoms include frequency and nausea. Associated symptoms comments: Odor .      Medication list has been reviewed and updated.  No outpatient medications have been marked as taking for the 10/13/23 encounter (Office Visit) with Justus Leita DEL, MD.     Review of Systems  Gastrointestinal:  Positive for nausea.  Genitourinary:  Positive for frequency.    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   Irritable bowel syndrome with constipation 09/28/2016   Chronic tension-type headache, not intractable 09/03/2015   Sciatica of right side 03/17/2015   Iron deficiency anemia 03/17/2015   History of loop electrical excision procedure (LEEP) 09/28/2013    Allergies  Allergen Reactions   Codeine Shortness Of Breath    Immunization History  Administered Date(s) Administered   Hepatitis B, ADULT 09/01/2017   Influenza,inj,Quad PF,6+ Mos 09/13/2018, 01/16/2020, 12/10/2020, 03/10/2022   Influenza-Unspecified 01/06/2015, 01/16/2020, 10/17/2022   PFIZER(Purple Top)SARS-COV-2 Vaccination 03/02/2019, 03/23/2019, 01/27/2020   PNEUMOCOCCAL CONJUGATE-20 07/27/2023   Tdap 03/18/2014    Past Surgical History:  Procedure Laterality Date    CERVICAL BIOPSY  W/ LOOP ELECTRODE EXCISION  2008   TONSILLECTOMY      Social History   Tobacco Use   Smoking status: Former    Current packs/day: 0.00    Average packs/day: 0.3 packs/day for 10.0 years (2.5 ttl pk-yrs)    Types: Cigarettes    Start date: 06/17/2010    Quit date: 06/16/2020    Years since quitting: 3.3   Smokeless tobacco: Never  Vaping Use   Vaping status: Never Used  Substance Use Topics   Alcohol use: Yes    Alcohol/week: 0.0 standard drinks of alcohol    Comment: socially   Drug use: No    Family History  Problem Relation Age of Onset   Pancreatic cancer Father    Migraines Sister    Cancer Maternal Grandfather    Cancer Paternal Grandfather         05/11/2023    1:17 PM 03/23/2023   11:07 AM 01/04/2023   10:24 AM 04/29/2022    4:08 PM  GAD 7 : Generalized Anxiety Score  Nervous, Anxious, on Edge 0 0 0 0  Control/stop worrying 0 0 0 0  Worry too much - different things 0 0 0 0  Trouble relaxing 0 0 0 0  Restless 0 0 0 0  Easily annoyed or irritable 0 0 0 0  Afraid - awful might happen 0 0 0 0  Total GAD 7 Score 0 0 0 0  Anxiety Difficulty Not difficult at all Not difficult at all Not difficult at all Not difficult at all       05/11/2023    1:17 PM 03/23/2023  11:06 AM 01/04/2023   10:24 AM  Depression screen PHQ 2/9  Decreased Interest 0 0 0  Down, Depressed, Hopeless 0 0 0  PHQ - 2 Score 0 0 0  Altered sleeping 0 1 0  Tired, decreased energy 1 1 0  Change in appetite 0 0 0  Feeling bad or failure about yourself  0 0 0  Trouble concentrating 1 1 0  Moving slowly or fidgety/restless 0 0 0  Suicidal thoughts 0 0 0  PHQ-9 Score 2 3 0  Difficult doing work/chores  Not difficult at all Not difficult at all    BP Readings from Last 3 Encounters:  06/01/23 110/74  05/11/23 116/70  04/08/23 132/78    Wt Readings from Last 3 Encounters:  06/01/23 239 lb 3.2 oz (108.5 kg)  05/11/23 240 lb 8 oz (109.1 kg)  04/08/23 237 lb 6 oz (107.7 kg)     There were no vitals taken for this visit.  Physical Exam  Recent Labs     Component Value Date/Time   NA 138 06/01/2023 0853   K 5.0 06/01/2023 0853   CL 103 06/01/2023 0853   CO2 20 06/01/2023 0853   GLUCOSE 91 06/01/2023 0853   GLUCOSE 74 09/08/2021 1250   BUN 11 06/01/2023 0853   CREATININE 0.69 06/01/2023 0853   CALCIUM 9.4 06/01/2023 0853   PROT 7.6 03/23/2023 1142   ALBUMIN 4.5 03/23/2023 1142   AST 14 03/23/2023 1142   ALT 12 03/23/2023 1142   ALKPHOS 69 03/23/2023 1142   BILITOT 0.8 03/23/2023 1142   GFRNONAA >60 09/08/2021 1250   GFRAA 125 10/29/2019 1002    Lab Results  Component Value Date   WBC 9.9 03/09/2023   HGB 12.6 06/01/2023   HCT 40.2 06/01/2023   MCV 89.6 09/08/2021   PLT 315 03/09/2023   Lab Results  Component Value Date   HGBA1C 5.8 (H) 03/23/2023   HGBA1C 5.7 (H) 01/22/2021   HGBA1C 5.7 (H) 07/22/2020   Lab Results  Component Value Date   CHOL 207 (H) 03/23/2023   HDL 39 (L) 03/23/2023   LDLCALC 155 (H) 03/23/2023   TRIG 73 03/23/2023   CHOLHDL 5.3 (H) 03/23/2023   Lab Results  Component Value Date   TSH 1.520 03/23/2023      Assessment and Plan:  There are no diagnoses linked to this encounter.   No follow-ups on file.    Rolan Hoyle, PA-C, DMSc, Nutritionist [facility] Primary Care and Sports Medicine [facility] [phone]

## 2023-10-17 ENCOUNTER — Ambulatory Visit: Payer: Self-pay | Admitting: Internal Medicine

## 2023-10-17 LAB — URINE CULTURE

## 2023-10-28 ENCOUNTER — Other Ambulatory Visit (HOSPITAL_COMMUNITY): Payer: Self-pay

## 2023-10-28 ENCOUNTER — Encounter: Payer: Self-pay | Admitting: Internal Medicine

## 2023-10-28 ENCOUNTER — Other Ambulatory Visit: Payer: Self-pay | Admitting: Internal Medicine

## 2023-10-28 MED ORDER — WEGOVY 0.25 MG/0.5ML ~~LOC~~ SOAJ: 0.2500 mg | SUBCUTANEOUS | 0 refills | Status: DC

## 2023-10-28 NOTE — Assessment & Plan Note (Signed)
 Enrolled in weight management plan with her insurance. Zepbound prescribed for obesity associated with HLD, GERD and Pre-DM.

## 2023-10-28 NOTE — Progress Notes (Unsigned)
 Date:  10/28/2023   Name:  Carly Martin   DOB:  [birth year]   MRN:  [id number]   Chief Complaint: No chief complaint on file.  HPI  Review of Systems   Lab Results  Component Value Date   NA 138 06/01/2023   K 5.0 06/01/2023   CO2 20 06/01/2023   GLUCOSE 91 06/01/2023   BUN 11 06/01/2023   CREATININE 0.69 06/01/2023   CALCIUM 9.4 06/01/2023   EGFR 113 06/01/2023   GFRNONAA >60 09/08/2021   Lab Results  Component Value Date   CHOL 207 (H) 03/23/2023   HDL 39 (L) 03/23/2023   LDLCALC 155 (H) 03/23/2023   TRIG 73 03/23/2023   CHOLHDL 5.3 (H) 03/23/2023   Lab Results  Component Value Date   TSH 1.520 03/23/2023   Lab Results  Component Value Date   HGBA1C 5.8 (H) 03/23/2023   Lab Results  Component Value Date   WBC 9.9 03/09/2023   HGB 12.6 06/01/2023   HCT 40.2 06/01/2023   MCV 89.6 09/08/2021   PLT 315 03/09/2023   Lab Results  Component Value Date   ALT 12 03/23/2023   AST 14 03/23/2023   ALKPHOS 69 03/23/2023   BILITOT 0.8 03/23/2023   Lab Results  Component Value Date   VD25OH 17.6 03/09/2023     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   Multiple sclerosis 04/29/2022   GERD without esophagitis 07/22/2021   Nexplanon in place 02/27/2021   Obesity, Class II, BMI 35-39.9 08/06/2020   Mild hyperlipidemia 07/23/2020   Anxiety state 07/21/2020   Vitamin D  deficiency 07/21/2020   Foot drop, right 08/15/2019   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   Irritable bowel syndrome with constipation 09/28/2016   Chronic tension-type headache, not intractable 09/03/2015   Sciatica of right side 03/17/2015   Iron deficiency anemia 03/17/2015   History of loop electrical excision procedure (LEEP) 09/28/2013    Allergies  Allergen Reactions   Codeine  Shortness Of Breath    Past Surgical History:  Procedure Laterality Date   CERVICAL BIOPSY  W/ LOOP ELECTRODE EXCISION  2008   TONSILLECTOMY      Social History   Tobacco Use   Smoking status: Former    Current packs/day: 0.00    Average packs/day: 0.3 packs/day for 10.0 years (2.5 ttl pk-yrs)    Types: Cigarettes    Start date: 06/17/2010    Quit date: 06/16/2020    Years since quitting: 3.3   Smokeless tobacco: Never  Vaping Use   Vaping status: Never Used  Substance Use Topics   Alcohol use: Yes    Alcohol/week: 0.0 standard drinks of alcohol    Comment: socially   Drug use: No     Medication list has been reviewed and updated.  No outpatient medications have been marked as taking for the 10/28/23 encounter (Orders Only) with Justus Leita DEL, MD.       10/13/2023   11:27 AM 05/11/2023    1:17 PM 03/23/2023   11:07 AM 01/04/2023   10:24 AM  GAD 7 : Generalized Anxiety Score  Nervous, Anxious, on Edge 0 0 0 0  Control/stop worrying 0 0 0 0  Worry too much - different things 0 0 0 0  Trouble relaxing 0 0 0 0  Restless 0  0 0 0  Easily annoyed or irritable 0 0 0 0  Afraid - awful might happen 0 0 0 0  Total GAD 7 Score 0 0 0 0  Anxiety Difficulty Not difficult at all Not difficult at all Not difficult at all Not difficult at all       10/13/2023   11:27 AM 05/11/2023    1:17 PM 03/23/2023   11:06 AM  Depression screen PHQ 2/9  Decreased Interest 0 0 0  Down, Depressed, Hopeless 0 0 0  PHQ - 2 Score 0 0 0  Altered sleeping  0 1  Tired, decreased energy  1 1  Change in appetite  0 0  Feeling bad or failure about yourself   0 0  Trouble concentrating  1 1  Moving slowly or fidgety/restless  0 0  Suicidal thoughts  0 0  PHQ-9 Score  2 3  Difficult doing work/chores   Not difficult at all    BP Readings from Last 3 Encounters:  10/13/23 122/76  06/01/23 110/74  05/11/23 116/70    Physical Exam  Wt Readings from Last 3 Encounters:  10/13/23 228 lb  (103.4 kg)  06/01/23 239 lb 3.2 oz (108.5 kg)  05/11/23 240 lb 8 oz (109.1 kg)    There were no vitals taken for this visit.  Assessment and Plan:  Problem List Items Addressed This Visit   None   No follow-ups on file.    Leita HILARIO Adie, MD [facility] Primary Care and Sports Medicine [place]

## 2023-10-31 ENCOUNTER — Telehealth: Payer: Self-pay | Admitting: Pharmacy Technician

## 2023-10-31 ENCOUNTER — Other Ambulatory Visit (HOSPITAL_COMMUNITY): Payer: Self-pay

## 2023-10-31 NOTE — Telephone Encounter (Signed)
 Pharmacy Patient Advocate Encounter   Received notification from Patient Advice Request messages that prior authorization for Zepbound 2.5mg /0.35ml auto-injectors is required/requested.   Insurance verification completed.   The patient is insured through [employer] .   Per test claim: PA required; PA submitted to above mentioned insurance via Latent Key/confirmation #/EOC [id number] Status is pending

## 2023-10-31 NOTE — Telephone Encounter (Signed)
 PA request has been Submitted. New Encounter has been or will be created for follow up. For additional info see Pharmacy Prior Auth telephone encounter from 10/31/2023.

## 2023-10-31 NOTE — Telephone Encounter (Signed)
 Pharmacy Patient Advocate Encounter  Received notification from [facility] that Prior Authorization for Zepbound 2.5MG /0.5ML pen-injectors has been APPROVED from 10/01/2023 to 06/27/2024. Ran test claim, Copay is $49.99. This test claim was processed through [facility]- copay amounts may vary at other pharmacies due to pharmacy/plan contracts, or as the patient moves through the different stages of their insurance plan.   PA #/Case ID/Reference #: [id number]

## 2023-11-01 ENCOUNTER — Other Ambulatory Visit: Payer: Self-pay

## 2023-11-01 MED ORDER — ZEPBOUND 2.5 MG/0.5ML ~~LOC~~ SOAJ: 2.5000 mg | SUBCUTANEOUS | 0 refills | Status: DC

## 2023-11-01 MED ORDER — WEGOVY 0.25 MG/0.5ML ~~LOC~~ SOAJ: 0.2500 mg | SUBCUTANEOUS | 0 refills | Status: DC

## 2023-11-15 ENCOUNTER — Other Ambulatory Visit: Payer: Self-pay

## 2023-11-15 MED ORDER — TIRZEPATIDE-WEIGHT MANAGEMENT 5 MG/0.5ML ~~LOC~~ SOLN: 5.0000 mg | SUBCUTANEOUS | 0 refills | Status: DC

## 2023-11-29 NOTE — Progress Notes (Unsigned)
  Electrophysiology Office Follow up Visit Note:    Date:  11/30/2023   ID:  Carly Martin, DOB December 15, 1985, MRN [id number]  PCP:  Justus Leita DEL, MD  [facility] Cardiologist:  Redell Cave, MD  [facility] Electrophysiologist:  OLE ONEIDA HOLTS, MD    Interval History:     Carly Martin is a 38 y.o. female who presents for a follow up visit.   I last saw the patient April 28, 2022 for frequent PVCs.  She also has a right sided aortic arch.  At the last appointment we were planning for cardiac MRI to assess the etiology of her PVCs. She was able to get her MRI in May 2025.  This showed no LGE and a normal ejection fraction.  There were no significant valve abnormalities.       Past medical, surgical, social and family history were reviewed.  ROS:   Please see the history of present illness.    All other systems reviewed and are negative.  EKGs/Labs/Other Studies Reviewed:    The following studies were reviewed today:  Jun 16, 2023 cardiac MRI reviewed EF 55% No LGE No evidence of infiltrative or inflammatory disease No significant valve abnormalities        Physical Exam:    VS:  BP 103/72   Pulse 81   Ht 5' 5 (1.651 m)   Wt 224 lb (101.6 kg)   SpO2 98%   BMI 37.28 kg/m     Wt Readings from Last 3 Encounters:  11/30/23 224 lb (101.6 kg)  10/13/23 228 lb (103.4 kg)  06/01/23 239 lb 3.2 oz (108.5 kg)     GEN: no distress CARD: RRR, No MRG RESP: No IWOB. CTAB.      ASSESSMENT:    1. Frequent PVCs   2. Morbid obesity (HCC)    PLAN:    In order of problems listed above:  #Frequent PVCs Normal ejection fraction and relatively asymptomatic. Most recent EKG without any PVCs  #Right sided aortic arch Follows with Dr. Cave  Can follow-up as needed with EP.  I discussed my upcoming departure from [facility].  If she needs EP care in future, can follow-up with Dr. Kennyth.   Signed, Ole Holts, MD, [facility],  [facility] 11/30/2023 9:18 AM    Electrophysiology [facility]

## 2023-11-30 ENCOUNTER — Encounter: Payer: Self-pay | Admitting: Cardiology

## 2023-11-30 ENCOUNTER — Ambulatory Visit: Attending: Cardiology | Admitting: Cardiology

## 2023-11-30 VITALS — BP 103/72 | HR 81 | Ht 65.0 in | Wt 224.0 lb

## 2023-11-30 DIAGNOSIS — I493 Ventricular premature depolarization: Secondary | ICD-10-CM | POA: Diagnosis not present

## 2023-11-30 NOTE — Patient Instructions (Signed)
 Medication Instructions:  Your physician recommends that you continue on your current medications as directed. Please refer to the Current Medication list given to you today.  *If you need a refill on your cardiac medications before your next appointment, please call your pharmacy*  Follow-Up: At [facility], you and your health needs are our priority.  As part of our continuing mission to provide you with exceptional heart care, our providers are all part of one team.  This team includes your primary Cardiologist (physician) and Advanced Practice Providers or APPs (Physician Assistants and Nurse Practitioners) who all work together to provide you with the care you need, when you need it.  Your next appointment:   As needed with EP

## 2023-12-26 ENCOUNTER — Other Ambulatory Visit: Payer: Self-pay

## 2023-12-26 ENCOUNTER — Encounter: Payer: Self-pay | Admitting: Internal Medicine

## 2023-12-26 MED ORDER — ZEPBOUND 7.5 MG/0.5ML ~~LOC~~ SOAJ: 7.5000 mg | SUBCUTANEOUS | 0 refills | Status: DC

## 2024-01-30 ENCOUNTER — Encounter: Payer: Self-pay | Admitting: Internal Medicine

## 2024-01-30 NOTE — Telephone Encounter (Signed)
 Please review.  KP

## 2024-02-13 ENCOUNTER — Encounter: Payer: Self-pay | Admitting: Student

## 2024-02-14 MED ORDER — ZEPBOUND 7.5 MG/0.5ML ~~LOC~~ SOAJ: 7.5000 mg | SUBCUTANEOUS | 1 refills | Status: AC

## 2024-02-14 NOTE — Telephone Encounter (Signed)
 I went thru your next available [facility] appointment and it is until March 6, 26. That is after the date she has her TOC on 04/03/24.

## 2024-04-04 ENCOUNTER — Encounter: Admitting: Student
